# Patient Record
Sex: Female | Born: 1985 | Race: Black or African American | Hispanic: No | Marital: Single | Smoking: Former smoker
Health system: Southern US, Community
[De-identification: ages and names within clinical notes are randomized; demographics above are authoritative.]

## PROBLEM LIST (undated history)

## (undated) DIAGNOSIS — B977 Papillomavirus as the cause of diseases classified elsewhere: Secondary | ICD-10-CM

## (undated) DIAGNOSIS — J302 Other seasonal allergic rhinitis: Secondary | ICD-10-CM

## (undated) DIAGNOSIS — F419 Anxiety disorder, unspecified: Secondary | ICD-10-CM

## (undated) DIAGNOSIS — R87629 Unspecified abnormal cytological findings in specimens from vagina: Secondary | ICD-10-CM

## (undated) HISTORY — DX: Other seasonal allergic rhinitis: J30.2

## (undated) HISTORY — DX: Papillomavirus as the cause of diseases classified elsewhere: B97.7

## (undated) HISTORY — DX: Anxiety disorder, unspecified: F41.9

## (undated) HISTORY — DX: Unspecified abnormal cytological findings in specimens from vagina: R87.629

---

## 2012-06-21 HISTORY — PX: WISDOM TOOTH EXTRACTION: SHX21

## 2017-01-13 ENCOUNTER — Encounter: Payer: Self-pay | Admitting: Obstetrics and Gynecology

## 2017-01-13 ENCOUNTER — Ambulatory Visit (INDEPENDENT_AMBULATORY_CARE_PROVIDER_SITE_OTHER): Payer: PRIVATE HEALTH INSURANCE | Admitting: Obstetrics and Gynecology

## 2017-01-13 VITALS — BP 121/78 | HR 78 | Ht 62.5 in | Wt 210.1 lb

## 2017-01-13 DIAGNOSIS — Z113 Encounter for screening for infections with a predominantly sexual mode of transmission: Secondary | ICD-10-CM | POA: Diagnosis not present

## 2017-01-13 DIAGNOSIS — Z01419 Encounter for gynecological examination (general) (routine) without abnormal findings: Secondary | ICD-10-CM

## 2017-01-13 MED ORDER — VITAFOL ULTRA 29-0.6-0.4-200 MG PO CAPS: 1.0000 | ORAL_CAPSULE | Freq: Every day | ORAL | 12 refills | Status: DC

## 2017-01-13 MED ORDER — METRONIDAZOLE 0.75 % VA GEL: 1.0000 | Freq: Every day | VAGINAL | 6 refills | Status: DC

## 2017-01-13 NOTE — Progress Notes (Signed)
Subjective:     Christine Wong is a 31 y.o. female G0P0 with BMI 37 who is here for a comprehensive physical exam. The patient reports no problems. She is sexually active using nothing for contraception. She reports a monthly period of 5-6 days. She denies any pelvic pain or abnormal discharge. She is not planning a pregnancy but would welcome one if it occurred. She desires full STD screen. She desires a prescription for Metrogel in the event of recurrent BV  History reviewed. No pertinent past medical history. Past Surgical History:  Procedure Laterality Date  . WISDOM TOOTH EXTRACTION  2014   Family History  Problem Relation Age of Onset  . Hypertension Mother   . Breast cancer Mother   . Diabetes Father   . Kidney failure Father   . Heart disease Father   . Hypertension Father   . Pancreatic cancer Maternal Grandmother   . Hypertension Maternal Grandmother   . Breast cancer Paternal Grandmother   . Ovarian cancer Paternal Grandmother   . Colon cancer Maternal Uncle      Social History   Social History  . Marital status: Single    Spouse name: N/A  . Number of children: N/A  . Years of education: N/A   Occupational History  . Not on file.   Social History Main Topics  . Smoking status: Former Smoker    Types: Cigarettes    Quit date: 2009  . Smokeless tobacco: Never Used  . Alcohol use Yes     Comment: socially  . Drug use: Yes    Types: Marijuana     Comment: socially  . Sexual activity: Yes    Partners: Female    Birth control/ protection: None   Other Topics Concern  . Not on file   Social History Narrative  . No narrative on file   Health Maintenance  Topic Date Due  . HIV Screening  10/08/1998  . TETANUS/TDAP  10/08/2002  . PAP SMEAR  10/07/2004  . INFLUENZA VACCINE  01/19/2017       Review of Systems Pertinent items are noted in HPI.   Objective:  Blood pressure 121/78, pulse 78, height 5' 2.5" (1.588 m), weight 210 lb 1.6 oz (95.3 kg),  last menstrual period 12/28/2016.     GENERAL: Well-developed, well-nourished female in no acute distress.  HEENT: Normocephalic, atraumatic. Sclerae anicteric.  NECK: Supple. Normal thyroid.  LUNGS: Clear to auscultation bilaterally.  HEART: Regular rate and rhythm. BREASTS: Symmetric in size. No palpable masses or lymphadenopathy, skin changes, or nipple drainage. ABDOMEN: Soft, nontender, nondistended. PELVIC: Normal external female genitalia. Vagina is pink and rugated.  Normal discharge. Normal appearing cervix. Uterus is normal in size. No adnexal mass or tenderness. EXTREMITIES: No cyanosis, clubbing, or edema, 2+ distal pulses.    Assessment:    Healthy female exam.      Plan:    pap smear collected. STD screen collected Patient will be contacted with any abnormal results Patient advised to perform monthly self breast exam Weight loss management also discussed Advised to start prenatal vitamins See After Visit Summary for Counseling Recommendations

## 2017-01-14 LAB — HEPATITIS B SURFACE ANTIGEN: HEP B S AG: NEGATIVE

## 2017-01-14 LAB — CERVICOVAGINAL ANCILLARY ONLY
BACTERIAL VAGINITIS: POSITIVE — AB
CANDIDA VAGINITIS: NEGATIVE
CHLAMYDIA, DNA PROBE: NEGATIVE
Neisseria Gonorrhea: NEGATIVE
TRICH (WINDOWPATH): NEGATIVE

## 2017-01-14 LAB — RPR: RPR Ser Ql: NONREACTIVE

## 2017-01-14 LAB — HEMOGLOBIN A1C
Est. average glucose Bld gHb Est-mCnc: 103 mg/dL
Hgb A1c MFr Bld: 5.2 % (ref 4.8–5.6)

## 2017-01-14 LAB — HIV ANTIBODY (ROUTINE TESTING W REFLEX): HIV Screen 4th Generation wRfx: NONREACTIVE

## 2017-01-14 LAB — HEPATITIS C ANTIBODY: Hep C Virus Ab: <0.1 s/co ratio (ref 0.0–0.9)

## 2017-01-18 LAB — CYTOLOGY - PAP
DIAGNOSIS: NEGATIVE
HPV: DETECTED — AB

## 2017-01-19 ENCOUNTER — Telehealth: Payer: Self-pay | Admitting: Obstetrics and Gynecology

## 2017-01-19 NOTE — Telephone Encounter (Signed)
Just returning patients call.  She had left message that she was notified she had an Rx to pick up and did not know what this was for.  In reviewing her chart I see her labs came back showing BV.  Notified patient of result and to pick up Rx.

## 2017-03-29 ENCOUNTER — Telehealth: Payer: Self-pay | Admitting: General Practice

## 2017-03-29 NOTE — Telephone Encounter (Signed)
Left voicemail for pt to return call.

## 2017-03-29 NOTE — Telephone Encounter (Signed)
Patient called and left message requesting test results.

## 2018-01-13 ENCOUNTER — Encounter

## 2019-08-20 DIAGNOSIS — I82409 Acute embolism and thrombosis of unspecified deep veins of unspecified lower extremity: Secondary | ICD-10-CM

## 2019-08-20 HISTORY — DX: Acute embolism and thrombosis of unspecified deep veins of unspecified lower extremity: I82.409

## 2020-06-21 NOTE — L&D Delivery Note (Signed)
Delivery Note    Requested by Dr. Langston Masker to attend this C-section at 39 1/7 weeks due to fetal heart rate indication .   Born to a 35 year old G3P0020 mother with pregnancy complicated by  concern for gestational hypertension and Preeclampsia..  Rupture of membranes occurred 3 hours prior to delivery with clear   fluid.  Delayed cord clamping performed x 1 minute.  Infant vigorous with good spontaneous cry.  Routine NRP followed including warming, drying and stimulation.  Apgars 9 at 1 minute, 9 at 5 minutes.  Physical exam within normal limits.  Left in OR for skin-to-skin contact with mother, in care of nursing staff.  Care transferred to Pediatrician.  Merlon Alcorta, NNP-BC

## 2021-02-17 ENCOUNTER — Telehealth: Payer: Self-pay

## 2021-02-17 ENCOUNTER — Other Ambulatory Visit: Payer: Self-pay | Admitting: Obstetrics and Gynecology

## 2021-02-17 DIAGNOSIS — D6851 Activated protein C resistance: Secondary | ICD-10-CM

## 2021-02-17 DIAGNOSIS — Z3A28 28 weeks gestation of pregnancy: Secondary | ICD-10-CM

## 2021-02-17 DIAGNOSIS — O99119 Other diseases of the blood and blood-forming organs and certain disorders involving the immune mechanism complicating pregnancy, unspecified trimester: Secondary | ICD-10-CM

## 2021-02-17 DIAGNOSIS — Z363 Encounter for antenatal screening for malformations: Secondary | ICD-10-CM

## 2021-02-17 DIAGNOSIS — Z86718 Personal history of other venous thrombosis and embolism: Secondary | ICD-10-CM

## 2021-02-17 NOTE — Telephone Encounter (Signed)
Called and left message for patient to call our office regarding appointment in [facility]

## 2021-02-19 ENCOUNTER — Telehealth: Payer: Self-pay | Admitting: Hematology and Oncology

## 2021-02-19 NOTE — Telephone Encounter (Signed)
Scheduled appt per 8/24 referral. Pt is aware of appt date and time.  

## 2021-03-02 ENCOUNTER — Other Ambulatory Visit: Payer: Self-pay

## 2021-03-02 ENCOUNTER — Inpatient Hospital Stay: Payer: Managed Care, Other (non HMO)

## 2021-03-02 ENCOUNTER — Inpatient Hospital Stay: Payer: Managed Care, Other (non HMO) | Attending: Hematology and Oncology | Admitting: Hematology and Oncology

## 2021-03-02 ENCOUNTER — Encounter: Payer: Self-pay | Admitting: Hematology and Oncology

## 2021-03-02 VITALS — BP 122/75 | HR 92 | Temp 97.9°F | Resp 17 | Ht 62.5 in | Wt 218.5 lb

## 2021-03-02 DIAGNOSIS — D6851 Activated protein C resistance: Secondary | ICD-10-CM | POA: Diagnosis present

## 2021-03-02 DIAGNOSIS — I82402 Acute embolism and thrombosis of unspecified deep veins of left lower extremity: Secondary | ICD-10-CM | POA: Diagnosis not present

## 2021-03-02 DIAGNOSIS — Z880 Allergy status to penicillin: Secondary | ICD-10-CM | POA: Insufficient documentation

## 2021-03-02 DIAGNOSIS — Z803 Family history of malignant neoplasm of breast: Secondary | ICD-10-CM | POA: Diagnosis not present

## 2021-03-02 DIAGNOSIS — Z8249 Family history of ischemic heart disease and other diseases of the circulatory system: Secondary | ICD-10-CM | POA: Insufficient documentation

## 2021-03-02 DIAGNOSIS — Z86711 Personal history of pulmonary embolism: Secondary | ICD-10-CM | POA: Diagnosis not present

## 2021-03-02 DIAGNOSIS — I8290 Acute embolism and thrombosis of unspecified vein: Secondary | ICD-10-CM

## 2021-03-02 DIAGNOSIS — Z3A00 Weeks of gestation of pregnancy not specified: Secondary | ICD-10-CM | POA: Diagnosis not present

## 2021-03-02 DIAGNOSIS — O99891 Other specified diseases and conditions complicating pregnancy: Secondary | ICD-10-CM | POA: Diagnosis present

## 2021-03-02 DIAGNOSIS — Z7901 Long term (current) use of anticoagulants: Secondary | ICD-10-CM | POA: Diagnosis not present

## 2021-03-02 DIAGNOSIS — Z833 Family history of diabetes mellitus: Secondary | ICD-10-CM | POA: Diagnosis not present

## 2021-03-02 DIAGNOSIS — Z86718 Personal history of other venous thrombosis and embolism: Secondary | ICD-10-CM | POA: Diagnosis not present

## 2021-03-02 DIAGNOSIS — Z841 Family history of disorders of kidney and ureter: Secondary | ICD-10-CM | POA: Insufficient documentation

## 2021-03-02 DIAGNOSIS — Z8041 Family history of malignant neoplasm of ovary: Secondary | ICD-10-CM | POA: Insufficient documentation

## 2021-03-02 DIAGNOSIS — Z800 Family history of malignant neoplasm of digestive organs: Secondary | ICD-10-CM | POA: Diagnosis not present

## 2021-03-02 DIAGNOSIS — Z87891 Personal history of nicotine dependence: Secondary | ICD-10-CM | POA: Insufficient documentation

## 2021-03-02 LAB — CBC WITH DIFFERENTIAL (CANCER CENTER ONLY)
Abs Immature Granulocytes: 0.33 10*3/uL — ABNORMAL HIGH (ref 0.00–0.07)
Basophils Absolute: 0.1 10*3/uL (ref 0.0–0.1)
Basophils Relative: 1 %
Eosinophils Absolute: 0.2 10*3/uL (ref 0.0–0.5)
Eosinophils Relative: 2 %
HCT: 30.4 % — ABNORMAL LOW (ref 36.0–46.0)
Hemoglobin: 10.5 g/dL — ABNORMAL LOW (ref 12.0–15.0)
Immature Granulocytes: 3 %
Lymphocytes Relative: 9 %
Lymphs Abs: 1.2 10*3/uL (ref 0.7–4.0)
MCH: 29.4 pg (ref 26.0–34.0)
MCHC: 34.5 g/dL (ref 30.0–36.0)
MCV: 85.2 fL (ref 80.0–100.0)
Monocytes Absolute: 1.3 10*3/uL — ABNORMAL HIGH (ref 0.1–1.0)
Monocytes Relative: 10 %
Neutro Abs: 9.6 10*3/uL — ABNORMAL HIGH (ref 1.7–7.7)
Neutrophils Relative %: 75 %
Platelet Count: 447 10*3/uL — ABNORMAL HIGH (ref 150–400)
RBC: 3.57 MIL/uL — ABNORMAL LOW (ref 3.87–5.11)
RDW: 13.1 % (ref 11.5–15.5)
WBC Count: 12.7 10*3/uL — ABNORMAL HIGH (ref 4.0–10.5)
nRBC: 0 % (ref 0.0–0.2)

## 2021-03-02 LAB — CMP (CANCER CENTER ONLY)
ALT: 31 U/L (ref 0–44)
AST: 21 U/L (ref 15–41)
Albumin: 2.8 g/dL — ABNORMAL LOW (ref 3.5–5.0)
Alkaline Phosphatase: 170 U/L — ABNORMAL HIGH (ref 38–126)
Anion gap: 8 (ref 5–15)
BUN: 5 mg/dL — ABNORMAL LOW (ref 6–20)
CO2: 23 mmol/L (ref 22–32)
Calcium: 10.1 mg/dL (ref 8.9–10.3)
Chloride: 104 mmol/L (ref 98–111)
Creatinine: 0.63 mg/dL (ref 0.44–1.00)
GFR, Estimated: >60 mL/min (ref >60)
Glucose, Bld: 75 mg/dL (ref 70–99)
Potassium: 4.6 mmol/L (ref 3.5–5.1)
Sodium: 135 mmol/L (ref 135–145)
Total Bilirubin: 0.3 mg/dL (ref 0.3–1.2)
Total Protein: 6.9 g/dL (ref 6.5–8.1)

## 2021-03-02 NOTE — Progress Notes (Signed)
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 Started on Xarelto therapy. Recommended indefinite anticoagulation. Diagnosed with heterozygous FVL 03/11/2021: establish care with Dr. 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 female with medical history significant for seasonal allergies, heterozygous factor V Leiden, prior DVT and PE on indefinite anticoagulation who presents for evaluation of thromboprophylaxis in pregnancy.  On review of the previous records Christine Wong was originally diagnosed with a VTE in March 2021.  It was considered unprovoked left lower extremity DVT and pulmonary embolism.  For this she was started on Xarelto therapy and recommended for continuation indefinitely.  She continues therapy until she found out she was pregnant at which time she was transitioned over to Lovenox 40 mg twice daily.  Subsequently this was transitioned over to Lovenox 150 mg daily.  She underwent all of his care in [place] but subsequently moved down to [place] for better family support.  She presents today to establish care with a new hematological provider.  On exam today Christine Wong notes that she is a fairly healthy person.  She notes that during the pandemic she was working in [place] and had to commute 1 to 1.5 hours in traffic and spend a lot of time in her car.  She notes that she was not doing much exercise and was eating poorly.  Initially her presenting symptom was left leg swelling and pain.  She endorses Factor V Leiden  heterozygous status in herself and likely other members of her family based on her history of VTE.  She reports that her paternal grandfather's mother was of [place] descent.  She reports that her father had blood clots and had to be on Coumadin therapy.  There is also blood clots in her paternal grandfather and 2 cousins.  Her father also has type 2 diabetes.  Her mother side of the family has history remarkable for cancers.  Her mother had breast cancer, maternal grandmother had pancreatic cancer, maternal uncle had lung cancer, and other maternal uncle had colon cancer.  Additionally her paternal grandmother had breast and ovarian cancer.  On further discussion she notes that she was a smoker from ages approximately [age] and smoked about 1 pack/day.  For about 2-year period of time she also smoked marijuana but discontinued this when she developed a blood clot.  She notes that she did drink occasional alcohol but has had none since she became pregnant.  She does not vape or E-cigarettes.  She currently otherwise denies any fevers, chills, sweats, nausea vomiting or diarrhea.  The symptoms her prior clot has subsequently resolved.  Full 10 point ROS is listed below.  MEDICAL HISTORY:  Past Medical History:  Diagnosis Date   DVT (deep venous thrombosis) (HCC) 08/2019   Seasonal allergies     SURGICAL HISTORY: Past Surgical History:  Procedure Laterality Date   WISDOM TOOTH EXTRACTION  2014    SOCIAL HISTORY: Social History   Socioeconomic History   Marital status: Single    Spouse name: Not on file   Number of children: Not on file  Years of education: Not on file   Highest education level: Not on file  Occupational History   Not on file  Tobacco Use   Smoking status: Former    Types: Cigarettes    Quit date: 2009    Years since quitting: 13.7   Smokeless tobacco: Never  Vaping Use   Vaping Use: Never used  Substance and Sexual Activity   Alcohol use: Yes    Comment: socially    Drug use: Yes    Types: Marijuana    Comment: socially   Sexual activity: Yes    Partners: Female    Birth control/protection: None  Other Topics Concern   Not on file  Social History Narrative   Not on file   Social Determinants of Health   Financial Resource Strain: Not on file  Food Insecurity: Not on file  Transportation Needs: Not on file  Physical Activity: Not on file  Stress: Not on file  Social Connections: Not on file  Intimate Partner Violence: Not on file    FAMILY HISTORY: Family History  Problem Relation Age of Onset   Hypertension Mother    Breast cancer Mother    Diabetes Father    Kidney failure Father    Heart disease Father    Hypertension Father    Pancreatic cancer Maternal Grandmother    Hypertension Maternal Grandmother    Breast cancer Paternal Grandmother    Ovarian cancer Paternal Grandmother    Colon cancer Maternal Uncle     ALLERGIES:  is allergic to penicillins.  MEDICATIONS:  Current Outpatient Medications  Medication Sig Dispense Refill   enoxaparin (LOVENOX) 150 MG/ML injection Inject 150 mg into the skin daily.     cetirizine (ZYRTEC) 10 MG tablet Take by mouth.     fluticasone (FLONASE) 50 MCG/ACT nasal spray Place into the nose.     Prenat-Fe Poly-Methfol-FA-DHA (VITAFOL ULTRA) 29-0.6-0.4-200 MG CAPS Take 1 tablet by mouth daily. 30 capsule 12   No current facility-administered medications for this visit.    REVIEW OF SYSTEMS:   Constitutional: ( - ) fevers, ( - )  chills , ( - ) night sweats Eyes: ( - ) blurriness of vision, ( - ) double vision, ( - ) watery eyes Ears, nose, mouth, throat, and face: ( - ) mucositis, ( - ) sore throat Respiratory: ( - ) cough, ( - ) dyspnea, ( - ) wheezes Cardiovascular: ( - ) palpitation, ( - ) chest discomfort, ( - ) lower extremity swelling Gastrointestinal:  ( - ) nausea, ( - ) heartburn, ( - ) change in bowel habits Skin: ( - ) abnormal skin rashes Lymphatics: ( - ) new  lymphadenopathy, ( - ) easy bruising Neurological: ( - ) numbness, ( - ) tingling, ( - ) new weaknesses Behavioral/Psych: ( - ) mood change, ( - ) new changes  All other systems were reviewed with the patient and are negative.  PHYSICAL EXAMINATION: ECOG PERFORMANCE STATUS: 0 - Asymptomatic  Vitals:   03/02/21 1421  BP: 122/75  Pulse: 92  Resp: 17  Temp: 97.9 F (36.6 C)  SpO2: 99%   Filed Weights   03/02/21 1421  Weight: 218 lb 8 oz (99.1 kg)    GENERAL: well appearing middle-aged African-American female in NAD  SKIN: skin color, texture, turgor are normal, no rashes or significant lesions EYES: conjunctiva are pink and non-injected, sclera clear LUNGS: clear to auscultation and percussion with normal breathing effort HEART: regular rate & rhythm  and no murmurs and no lower extremity edema ABDOMEN: Gravid uterus Musculoskeletal: no cyanosis of digits and no clubbing  PSYCH: alert & oriented x 3, fluent speech NEURO: no focal motor/sensory deficits  LABORATORY DATA:  I have reviewed the data as listed CBC Latest Ref Rng & Units 03/02/2021  WBC 4.0 - 10.5 K/uL 12.7(H)  Hemoglobin 12.0 - 15.0 g/dL 10.5(L)  Hematocrit 36.0 - 46.0 % 30.4(L)  Platelets 150 - 400 K/uL 447(H)    CMP Latest Ref Rng & Units 03/02/2021  Glucose 70 - 99 mg/dL 75  BUN 6 - 20 mg/dL 5(L)  Creatinine 6.38 - 1.00 mg/dL 7.56  Sodium 433 - 295 mmol/L 135  Potassium 3.5 - 5.1 mmol/L 4.6  Chloride 98 - 111 mmol/L 104  CO2 22 - 32 mmol/L 23  Calcium 8.9 - 10.3 mg/dL 18.8  Total Protein 6.5 - 8.1 g/dL 6.9  Total Bilirubin 0.3 - 1.2 mg/dL 0.3  Alkaline Phos 38 - 126 U/L 170(H)  AST 15 - 41 U/L 21  ALT 0 - 44 U/L 31     RADIOGRAPHIC STUDIES: No results found.  ASSESSMENT & PLAN Christine Wong 35 y.o. female with medical history significant for seasonal allergies, heterozygous factor V Leiden, prior DVT and PE on indefinite anticoagulation who presents for evaluation of thromboprophylaxis in  pregnancy.  After review of the labs, review of the records, and discussion with the patient the patients findings are most consistent with an unprovoked VTE in the setting of heterozygous factor V Leiden.  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 This is preferable if patient desires daily dosing. Can continue this dosing regimen --after 6 weeks postpartum can transition back to Xarelto 20mg  PO daily. This should be safe with breast feeding --patient will require indefinite anticoagulation, though we can discuss decreasing Xarelto to maintenance dosing at a later time.  --plan to see patient back in 2 months to assess how she is doing with blood thinner prior to delivery.   Orders Placed This Encounter  Procedures   CBC with Differential (Cancer Center Only)    Standing Status:   Future    Number of Occurrences:   1    Standing Expiration Date:   03/02/2022   CMP (Cancer Center only)    Standing Status:   Future    Number of Occurrences:   1    Standing Expiration Date:   03/02/2022   Iron and TIBC    Standing Status:   Future    Number of Occurrences:   1    Standing Expiration Date:   03/02/2022   Ferritin    Standing Status:   Future    Number of Occurrences:   1    Standing Expiration Date:   03/02/2022    All questions were answered. The patient knows to call the clinic with any problems, questions or concerns.  A total of more than 60 minutes were spent on this encounter with face-to-face time and non-face-to-face time, including preparing to see the patient, ordering tests and/or medications, counseling the patient and coordination of care as outlined above.   05/02/2022, MD Department of Hematology/Oncology [facility] Phone: [phone] Pager: [phone] Email: [phone]  03/02/2021 5:30 PM

## 2021-03-03 ENCOUNTER — Encounter: Payer: Self-pay | Admitting: *Deleted

## 2021-03-03 LAB — IRON AND TIBC
Iron: 39 ug/dL (ref 28–170)
Saturation Ratios: 6 % — ABNORMAL LOW (ref 10.4–31.8)
TIBC: 617 ug/dL — ABNORMAL HIGH (ref 250–450)
UIBC: 578 ug/dL

## 2021-03-03 LAB — FERRITIN: Ferritin: 6 ng/mL — ABNORMAL LOW (ref 11–307)

## 2021-03-06 ENCOUNTER — Ambulatory Visit (HOSPITAL_BASED_OUTPATIENT_CLINIC_OR_DEPARTMENT_OTHER): Payer: Managed Care, Other (non HMO) | Admitting: Obstetrics

## 2021-03-06 ENCOUNTER — Other Ambulatory Visit: Payer: Self-pay

## 2021-03-06 ENCOUNTER — Ambulatory Visit: Payer: Managed Care, Other (non HMO) | Attending: Obstetrics and Gynecology

## 2021-03-06 ENCOUNTER — Ambulatory Visit: Payer: Managed Care, Other (non HMO) | Admitting: *Deleted

## 2021-03-06 ENCOUNTER — Other Ambulatory Visit: Payer: Self-pay | Admitting: *Deleted

## 2021-03-06 VITALS — BP 123/88 | HR 113

## 2021-03-06 DIAGNOSIS — O99342 Other mental disorders complicating pregnancy, second trimester: Secondary | ICD-10-CM | POA: Diagnosis not present

## 2021-03-06 DIAGNOSIS — O0992 Supervision of high risk pregnancy, unspecified, second trimester: Secondary | ICD-10-CM | POA: Diagnosis not present

## 2021-03-06 DIAGNOSIS — O2692 Pregnancy related conditions, unspecified, second trimester: Secondary | ICD-10-CM | POA: Diagnosis not present

## 2021-03-06 DIAGNOSIS — D6851 Activated protein C resistance: Secondary | ICD-10-CM

## 2021-03-06 DIAGNOSIS — O09523 Supervision of elderly multigravida, third trimester: Secondary | ICD-10-CM | POA: Diagnosis not present

## 2021-03-06 DIAGNOSIS — O99213 Obesity complicating pregnancy, third trimester: Secondary | ICD-10-CM

## 2021-03-06 DIAGNOSIS — D6859 Other primary thrombophilia: Secondary | ICD-10-CM | POA: Insufficient documentation

## 2021-03-06 DIAGNOSIS — Z3A28 28 weeks gestation of pregnancy: Secondary | ICD-10-CM | POA: Diagnosis not present

## 2021-03-06 DIAGNOSIS — F419 Anxiety disorder, unspecified: Secondary | ICD-10-CM | POA: Diagnosis not present

## 2021-03-06 DIAGNOSIS — O09522 Supervision of elderly multigravida, second trimester: Secondary | ICD-10-CM | POA: Insufficient documentation

## 2021-03-06 DIAGNOSIS — O99212 Obesity complicating pregnancy, second trimester: Secondary | ICD-10-CM | POA: Diagnosis not present

## 2021-03-06 DIAGNOSIS — Z6837 Body mass index (BMI) 37.0-37.9, adult: Secondary | ICD-10-CM

## 2021-03-06 DIAGNOSIS — O2293 Venous complication in pregnancy, unspecified, third trimester: Secondary | ICD-10-CM | POA: Diagnosis not present

## 2021-03-06 DIAGNOSIS — Z363 Encounter for antenatal screening for malformations: Secondary | ICD-10-CM

## 2021-03-06 DIAGNOSIS — Z86711 Personal history of pulmonary embolism: Secondary | ICD-10-CM | POA: Diagnosis not present

## 2021-03-06 DIAGNOSIS — O99112 Other diseases of the blood and blood-forming organs and certain disorders involving the immune mechanism complicating pregnancy, second trimester: Secondary | ICD-10-CM | POA: Diagnosis not present

## 2021-03-06 DIAGNOSIS — O99113 Other diseases of the blood and blood-forming organs and certain disorders involving the immune mechanism complicating pregnancy, third trimester: Secondary | ICD-10-CM | POA: Diagnosis not present

## 2021-03-06 DIAGNOSIS — Z86718 Personal history of other venous thrombosis and embolism: Secondary | ICD-10-CM

## 2021-03-06 DIAGNOSIS — E669 Obesity, unspecified: Secondary | ICD-10-CM | POA: Diagnosis not present

## 2021-03-06 DIAGNOSIS — Z3689 Encounter for other specified antenatal screening: Secondary | ICD-10-CM

## 2021-03-06 IMAGING — US US MFM OB DETAIL+14 WK
1 series · 15 of 28 positions shown · non-contrast
Comparison: none

[Series 1: us mfm ob detail+14 wk · 15 of 131 slices shown]
[im 1/131]
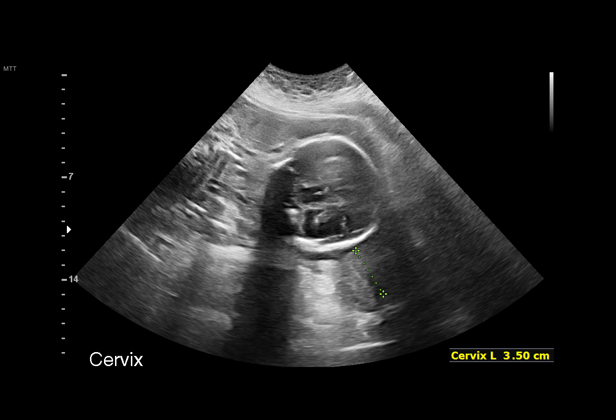
[im 10/131]
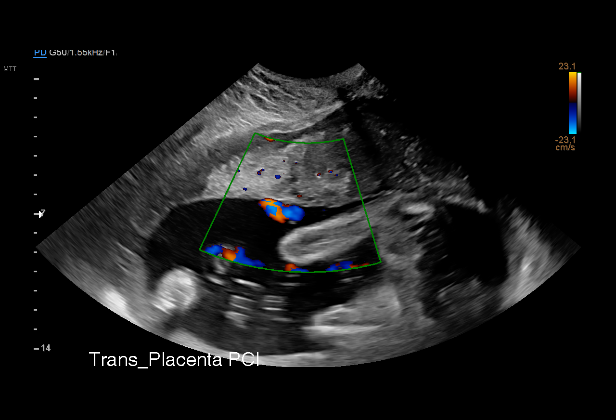
[im 20/131]
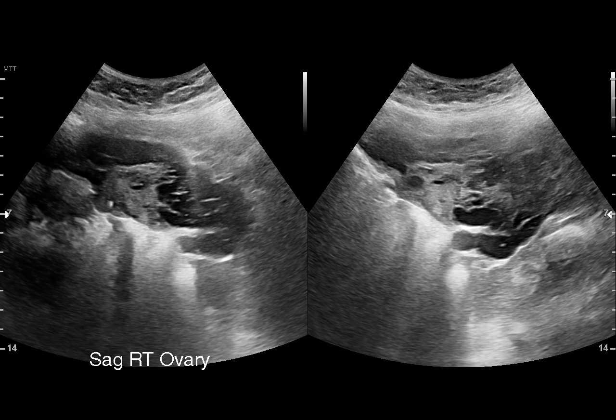
[im 29/131]
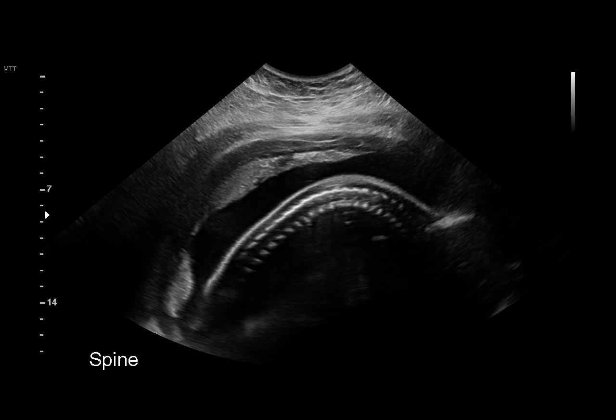
[im 39/131]
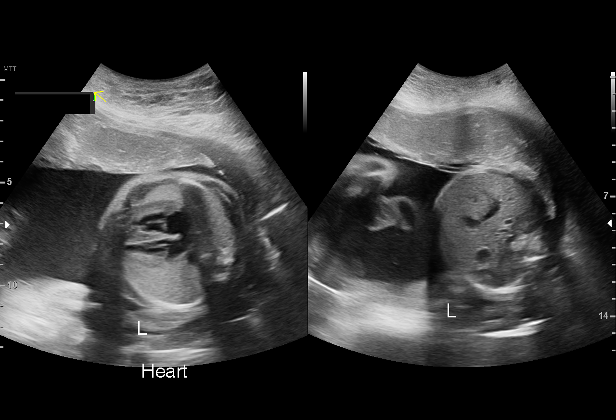
[im 49/131]
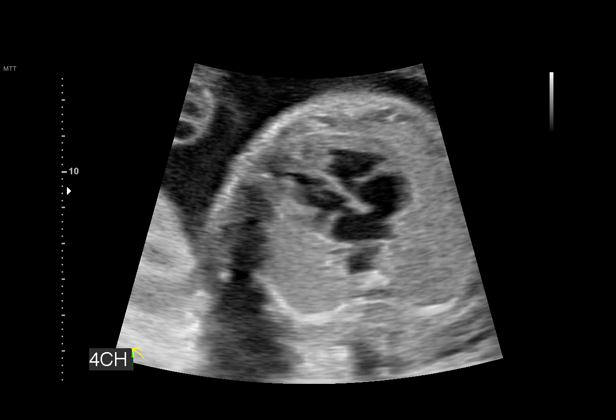
[im 58/131]
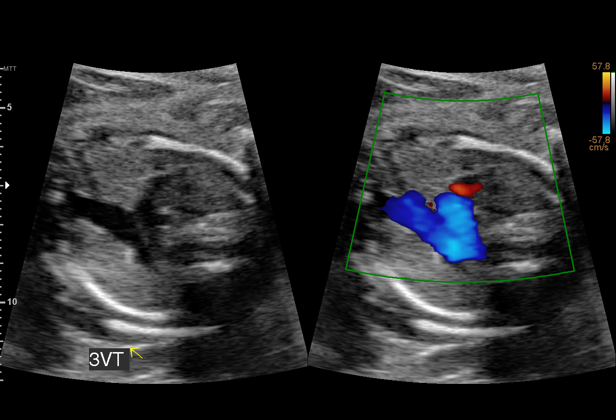
[im 68/131]
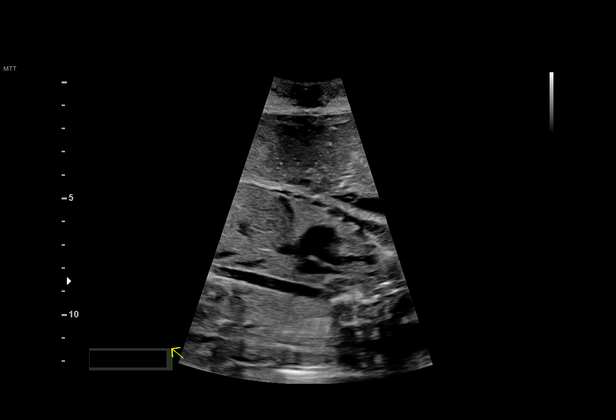
[im 73/131]
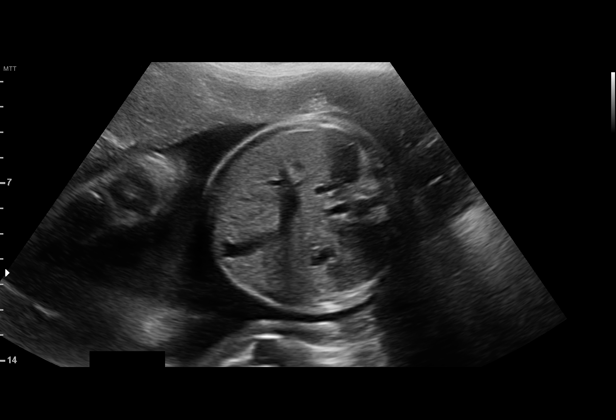
[im 82/131]
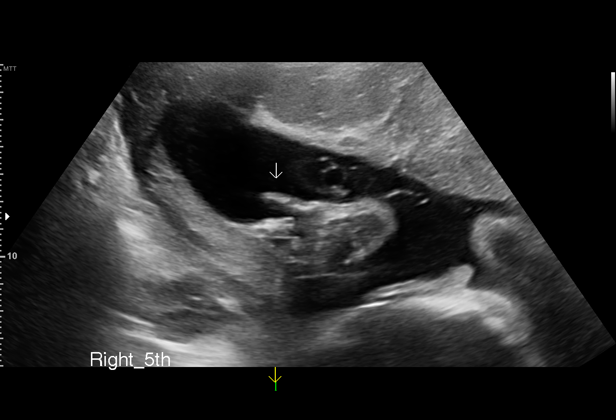
[im 92/131]
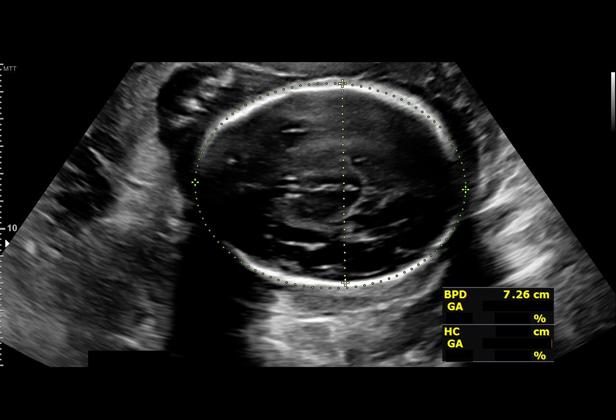
[im 102/131]
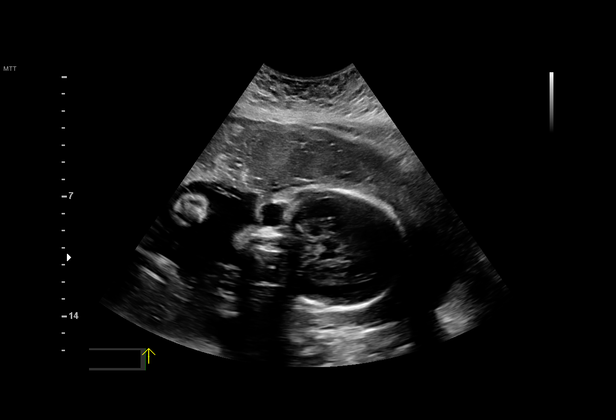
[im 111/131]
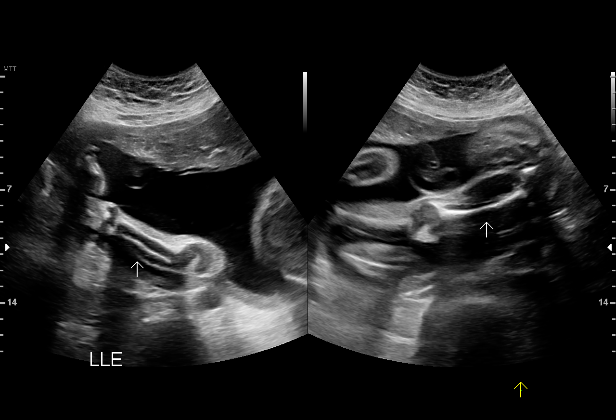
[im 121/131]
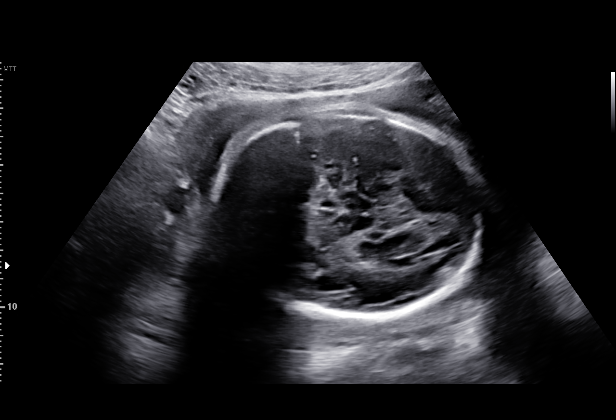
[im 131/131]
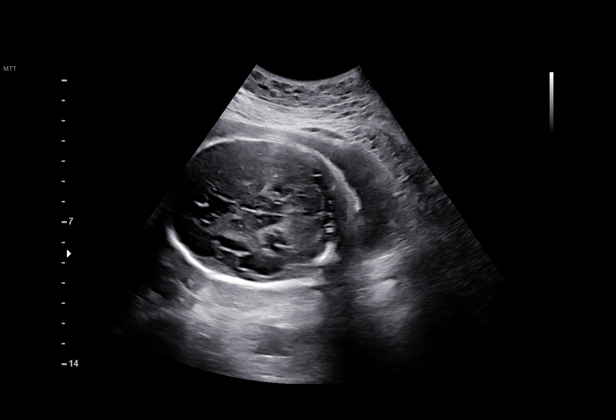

[15 of 28 positions shown; findings below may reference images not displayed]

[REDACTED]

Indications

 Medical complication of pregnancy (Factor V     [EI]
 [place], Hx DVT/PE)
 Advanced maternal age multigravida 35+,         [EI]
 third trimester
 Obesity complicating pregnancy, third           [EI]
 trimester (BMI 37)
 28 weeks gestation of pregnancy
 Encounter for antenatal screening for           [EI]
 malformations
 Anxiety during pregnancy, third trimester       O99.343, [EI]
Fetal Evaluation

 Num Of Fetuses:          1
 Fetal Heart Rate(bpm):   136
 Cardiac Activity:        Observed
 Presentation:            Cephalic
 Placenta:                Anterior
 P. Cord Insertion:       Visualized, central

 Amniotic Fluid
 AFI FV:      Within normal limits

 AFI Sum(cm)     %Tile       Largest Pocket(cm)
 22.9            95
 RUQ(cm)       RLQ(cm)       LUQ(cm)        LLQ(cm)

Biometry

 BPD:      72.2  mm     G. Age:  29w 0d         70  %    CI:        70.91   %    70 - 86
                                                         FL/HC:       18.4  %    18.8 -
 HC:      273.2  mm     G. Age:  29w 6d         76  %    HC/AC:       1.08       1.05 -
 AC:      252.7  mm     G. Age:  29w 3d         84  %    FL/BPD:      69.8  %    71 - 87
 FL:       50.4  mm     G. Age:  27w 0d         13  %    FL/AC:       19.9  %    20 - 24
 HUM:      47.8  mm     G. Age:  28w 0d         48  %
 CER:      34.5  mm     G. Age:  29w 1d         76  %

 LV:        3.5  mm
 CM:        8.9  mm

 Est. FW:    [EI]   gm   2 lb 13 oz      64  %
OB History

 Blood Type:   O+
 Gravidity:    3         Term:   0        Prem:   0        SAB:   2
 TOP:          0       Ectopic:  0        Living: 0
Gestational Age

 LMP:           28w 0d        Date:  [DATE]                 EDD:   [DATE]
 U/S Today:     28w 6d                                        EDD:   [DATE]
 Best:          28w 0d     Det. By:  LMP  ([DATE])          EDD:   [DATE]
Anatomy

 Cranium:               Appears normal         LVOT:                   Appears normal
 Cavum:                 Appears normal         Aortic Arch:            Appears normal
 Ventricles:            Appears normal         Ductal Arch:            Appears normal
 Choroid Plexus:        Not well visualized    Diaphragm:              Appears normal
 Cerebellum:            Appears normal         Stomach:                Appears normal, left
                                                                       sided
 Posterior Fossa:       Appears normal         Abdomen:                Appears normal
 Nuchal Fold:           Not applicable (>20    Abdominal Wall:         Appears nml (cord
                        wks GA)                                        insert, abd wall)
 Face:                  Appears normal         Cord Vessels:           Appears normal (3
                        (orbits and profile)                           vessel cord)
 Lips:                  Appears normal         Kidneys:                Appear normal
 Palate:                Not well visualized    Bladder:                Appears normal
 Thoracic:              Appears normal         Spine:                  Appears normal
 Heart:                 Appears normal; EIF    Upper Extremities:      Appears normal
 RVOT:                  Appears normal         Lower Extremities:      Appears normal

 Other:  Fetus appears to be female. Nasal bone, Lenses, VC, 3VV and 3VTV
         visualized. Heels/feet and open hands/5th digits visualized.
         Technically difficult due to fetal position.
Cervix Uterus Adnexa

 Cervix
 Length:            3.5  cm.
 Normal appearance by transabdominal scan.
 Uterus
 No abnormality visualized.

 Right Ovary
 Within normal limits.

 Left Ovary
 Within normal limits.

 Cul De Sac
 No free fluid seen.

 Adnexa
 No abnormality visualized.
Comments

 [place] was seen for a detailed fetal anatomy scan
 due to advanced maternal age.  She has screened positive
 as a heterozygous carrier for the factor V Leiden gene
 mutation.  She reports a significant history of a left leg DVT
 and pulmonary embolus in [DATE].  Her hematologist
 has recommended that she remain on lifelong
 anticoagulation due to her thrombophilia and history of
 thrombosis.  She was treated with Xarelto outside of
 pregnancy.  She is currently treated with a therapeutic dose
 of Lovenox 150 mg once a day.  She also reports a
 significant family history of thromboembolic events in both her
 father and grandfather.
 Ms. TIGER also reports a history of heart palpitations.  She
 has not had any evaluations for the palpitations.
 The patient recently [place] to [place] [facility] [place].  She reports that she had a fetal anatomy
 scan in [place] where an echogenic focus was noted.
 She denies any other significant past medical history and
 denies any problems in her current pregnancy.
 She had a cell free DNA test earlier in her pregnancy which
 indicated a low risk for trisomy 21, 18, and 13. A female fetus
 is predicted.
 She was informed that the fetal growth and amniotic fluid
 level were appropriate for her gestational age.
 On today's exam, an intracardiac echogenic focus was noted
 in the left ventricle of the fetal heart.  The small association
 between an echogenic focus and Down syndrome was
 discussed. Due to the echogenic focus noted today, the
 patient was offered and declined an amniocentesis today for
 definitive diagnosis of fetal aneuploidy.  She reports that she
 is comfortable with her negative cell free DNA test.
 The views of the fetal anatomy were limited today due to her
 advanced gestational age.
 The patient was informed that anomalies may be missed due
 to technical limitations. If the fetus is in a suboptimal position
 or maternal habitus is increased, visualization of the fetus in
 the maternal uterus may be impaired.
 The following were discussed today:
 Heterozygosity for factor V Leiden with a prior
 thromboembolic event in pregnancy
 Factor V Leiden is the most common thrombophilia.  People
 with this gene mutation have an increased risk of thrombosis.

 As pregnancy is a hypercoagulable state, with her history of a
 prior thromboembolic event, she should continue therapeutic
 Lovenox (150 mg daily) for the duration of pregnancy as
 recommended by her hematologist.
 In regards to the management of her anticoagulation and
 delivery, she should contine Lovenox up until the day before
 her scheduled delivery date.    As she is treated with a
 theraputic dose of anticoagulation, she may be eligible to
 receive regional anesthesia if it has been 24 hours or more

 An anesthesia consult should be obtained when she is
 admitted for delivery.
 As her hematologist has recommended that she remain on
 lifelong anticoagulation, Lovenox should be resumed 6 hours
 after a vaginal delivery and 12 hours following a cesarean
 delivery.
 The small association of thrombophilias with fetal growth
 restriction was discussed.  Due to this indication, we will
 continue to follow her with monthly growth ultrasounds.
 Due to her thrombophilia, her age, and obesity, weekly fetal
 testing should be started at 32 weeks.
 History of heart palpitations
 As she has never received a work-up for her heart
 palpitations, she should have a cardiology evaluation to
 determine if the heart palpitations are of any clinical
 significance.
 A follow-up growth scan was scheduled in our office in 4
 weeks.
 The patient stated that all of her questions have been
 answered to her complete satisfaction.
 A total of 30 minutes was spent counseling and coordinating
 the care for this patient.  Greater than 50% of the time was
 spent in direct face-to-face contact.
Recommendations

 Continue Lovenox 150 mg daily for the duration of her
 pregnancy
 Monthly growth ultrasounds
 Weekly fetal testing to be started at 32 weeks
 Anesthesia consult
 Cardiology consult
 Hold Lovenox for 24 hours prior to scheduled delivery
 Resume therapeutic anticoagulation 6 hours following a
 vaginal delivery and 12 hours following cesarean delivery

## 2021-03-07 NOTE — Progress Notes (Signed)
MFM Note  Christine Wong was seen for a detailed fetal anatomy scan due to advanced maternal age.  She has screened positive as a heterozygous carrier for the factor V Leiden gene mutation.  She reports a significant history of a left leg DVT and pulmonary embolus in March 2021.  Her hematologist has recommended that she remain on lifelong anticoagulation due to her thrombophilia and history of thrombosis.  She was treated with Xarelto outside of pregnancy.  She is currently treated with a therapeutic dose of Lovenox 150 mg once a day.  She also reports a significant family history of thromboembolic events in both her father and grandfather.  Christine Wong also reports a history of heart palpitations.  She has not had any evaluations for the palpitations.  The patient recently moved to [facility] from [place], [place].  She reports that she had a fetal anatomy scan in [place] where an echogenic focus was noted.  She denies any other significant past medical history and denies any problems in her current pregnancy.    She had a cell free DNA test earlier in her pregnancy which indicated a low risk for trisomy 63, 79, and 13. A female fetus is predicted.   She was informed that the fetal growth and amniotic fluid level were appropriate for her gestational age.   On today's exam, an intracardiac echogenic focus was noted in the left ventricle of the fetal heart.  The small association between an echogenic focus and Down syndrome was discussed. Due to the echogenic focus noted today, the patient was offered and declined an amniocentesis today for definitive diagnosis of fetal aneuploidy.  She reports that she is comfortable with her negative cell free DNA test.  The views of the fetal anatomy were limited today due to her advanced gestational age.  The patient was informed that anomalies may be missed due to technical limitations. If the fetus is in a suboptimal position or maternal habitus is  increased, visualization of the fetus in the maternal uterus may be impaired.  The following were discussed today:  Heterozygosity for factor V Leiden with a prior thromboembolic event in pregnancy  Factor V Leiden is the most common thrombophilia.  People with this gene mutation have an increased risk of thrombosis.    As pregnancy is a hypercoagulable state, with her history of a prior thromboembolic event, she should continue therapeutic Lovenox (150 mg daily) for the duration of pregnancy as recommended by her hematologist.  In regards to the management of her anticoagulation and delivery, she should contine Lovenox up until the day before her scheduled delivery date.      As she is treated with a theraputic dose of anticoagulation, she may be eligible to receive regional anesthesia if it has been 24 hours or more since she received the anticoagulation medication.   An anesthesia consult should be obtained when she is admitted for delivery.  As her hematologist has recommended that she remain on lifelong anticoagulation, Lovenox should be resumed 6 hours after a vaginal delivery and 12 hours following a cesarean delivery.   The small association of thrombophilias with fetal growth restriction was discussed.  Due to this indication, we will continue to follow her with monthly growth ultrasounds.  Due to her thrombophilia, her age, and obesity, weekly fetal testing should be started at 32 weeks.  History of heart palpitations  As she has never received a work-up for her heart palpitations, she should have a cardiology  evaluation to determine if the heart palpitations are of any clinical significance.  A follow-up growth scan was scheduled in our office in 4 weeks.    The patient stated that all of her questions have been answered to her complete satisfaction.  A total of 30 minutes was spent counseling and coordinating the care for this patient.  Greater than 50% of the time was spent  in direct face-to-face contact.  Recommendations:  Continue Lovenox 150 mg daily for the duration of her pregnancy Monthly growth ultrasounds Weekly fetal testing to be started at 32 weeks Anesthesia consult Cardiology consult Hold Lovenox for 24 hours prior to scheduled delivery Resume therapeutic anticoagulation 6 hours following a vaginal delivery and 12 hours following cesarean delivery

## 2021-03-23 ENCOUNTER — Telehealth: Payer: Self-pay | Admitting: *Deleted

## 2021-03-23 ENCOUNTER — Other Ambulatory Visit: Payer: Self-pay | Admitting: Hematology and Oncology

## 2021-03-23 DIAGNOSIS — D500 Iron deficiency anemia secondary to blood loss (chronic): Secondary | ICD-10-CM

## 2021-03-23 NOTE — Telephone Encounter (Signed)
Call made to [facility] ([facility] ) to have patient scheduled for IV iron,. She is in her 3rd trimester of pregnancy. She is nto receive 2 infusions, a week apart.

## 2021-04-06 ENCOUNTER — Ambulatory Visit: Payer: Managed Care, Other (non HMO) | Attending: Obstetrics

## 2021-04-06 ENCOUNTER — Other Ambulatory Visit: Payer: Self-pay | Admitting: Obstetrics

## 2021-04-06 ENCOUNTER — Ambulatory Visit: Payer: Managed Care, Other (non HMO) | Admitting: *Deleted

## 2021-04-06 ENCOUNTER — Other Ambulatory Visit: Payer: Self-pay

## 2021-04-06 VITALS — BP 97/60 | HR 101

## 2021-04-06 DIAGNOSIS — O283 Abnormal ultrasonic finding on antenatal screening of mother: Secondary | ICD-10-CM | POA: Insufficient documentation

## 2021-04-06 DIAGNOSIS — Z3A32 32 weeks gestation of pregnancy: Secondary | ICD-10-CM | POA: Diagnosis not present

## 2021-04-06 DIAGNOSIS — O09523 Supervision of elderly multigravida, third trimester: Secondary | ICD-10-CM

## 2021-04-06 DIAGNOSIS — O99119 Other diseases of the blood and blood-forming organs and certain disorders involving the immune mechanism complicating pregnancy, unspecified trimester: Secondary | ICD-10-CM | POA: Diagnosis present

## 2021-04-06 DIAGNOSIS — D6851 Activated protein C resistance: Secondary | ICD-10-CM | POA: Insufficient documentation

## 2021-04-06 DIAGNOSIS — O99213 Obesity complicating pregnancy, third trimester: Secondary | ICD-10-CM

## 2021-04-06 DIAGNOSIS — O99113 Other diseases of the blood and blood-forming organs and certain disorders involving the immune mechanism complicating pregnancy, third trimester: Secondary | ICD-10-CM | POA: Diagnosis not present

## 2021-04-06 DIAGNOSIS — Z86718 Personal history of other venous thrombosis and embolism: Secondary | ICD-10-CM

## 2021-04-06 DIAGNOSIS — Z6837 Body mass index (BMI) 37.0-37.9, adult: Secondary | ICD-10-CM | POA: Insufficient documentation

## 2021-04-06 IMAGING — US US MFM OB FOLLOW-UP
1 series · 13 of 28 positions shown · non-contrast
Comparison: none

[Series 1: us mfm ob follow-up · 33 acquisitions, 13 frames shown]
[im 2/33]
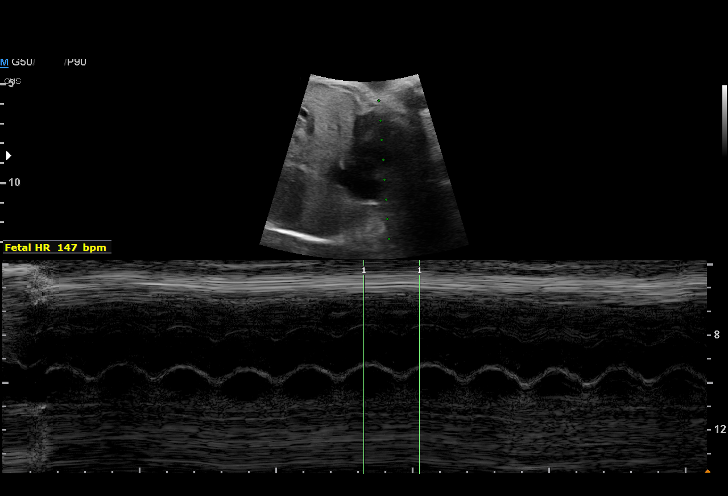
[im 4/33]
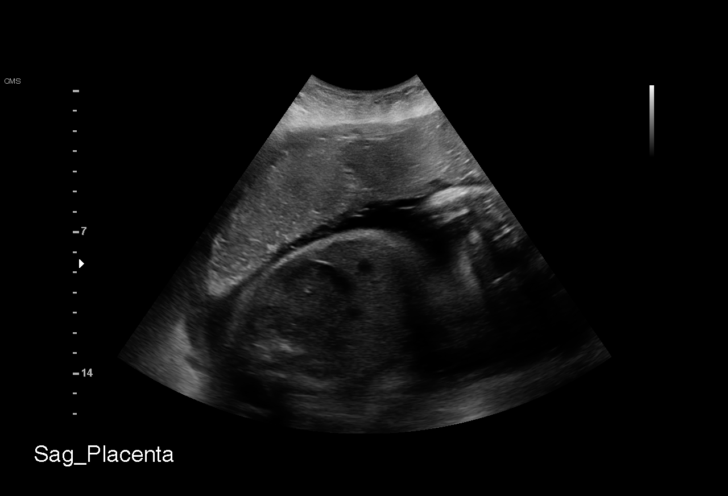
[im 6/33]
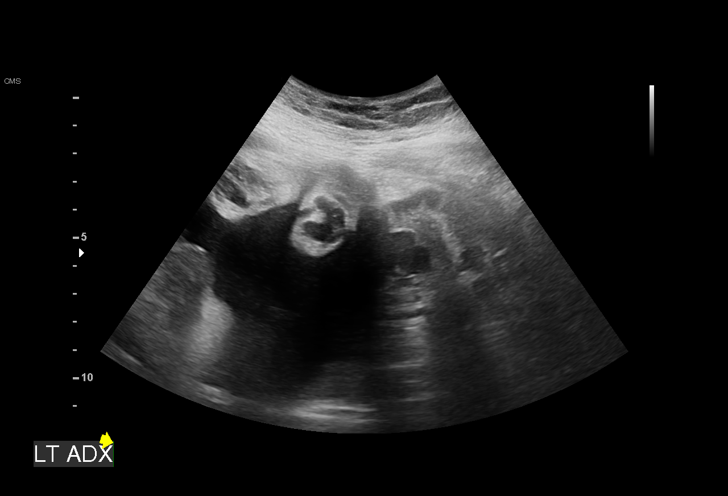
[im 9/33]
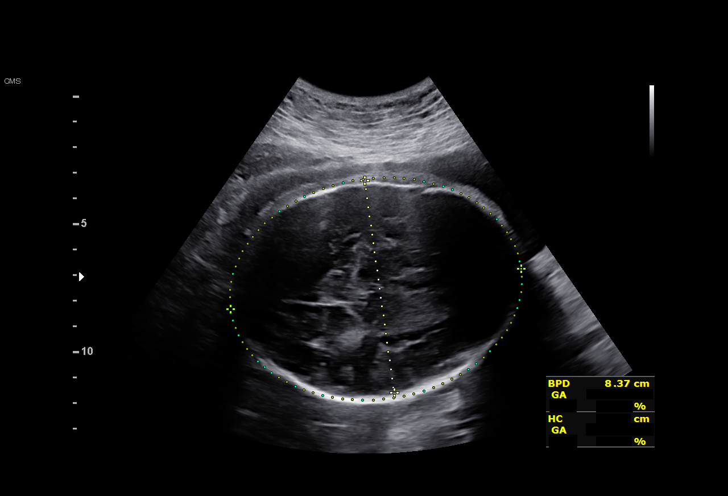
[im 11/33]
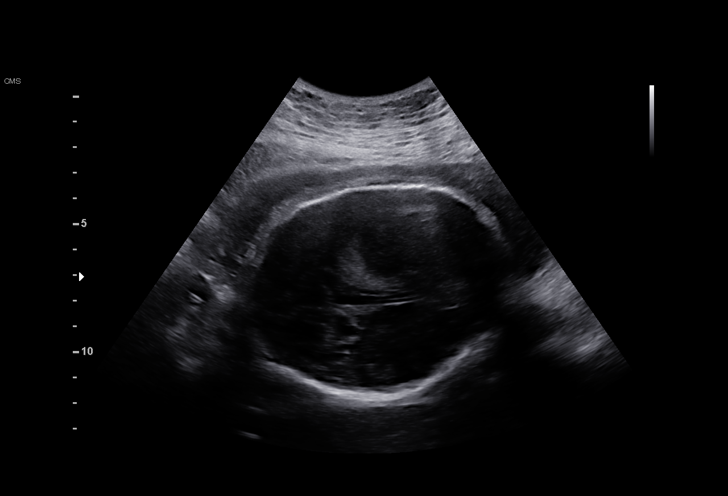
[im 14/33]
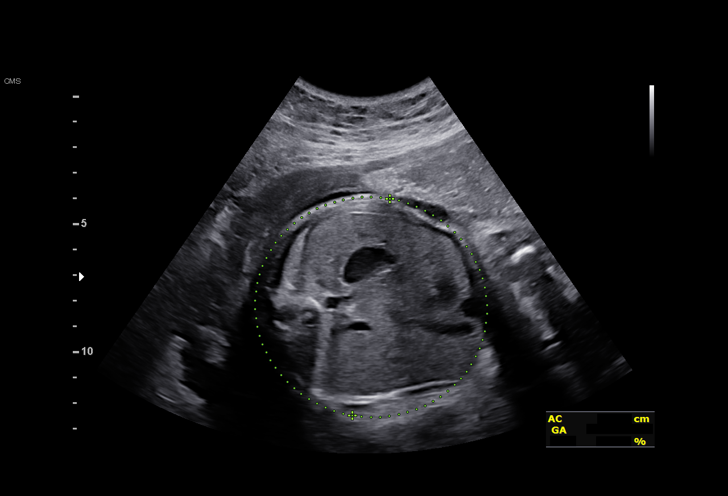
[im 17/33]
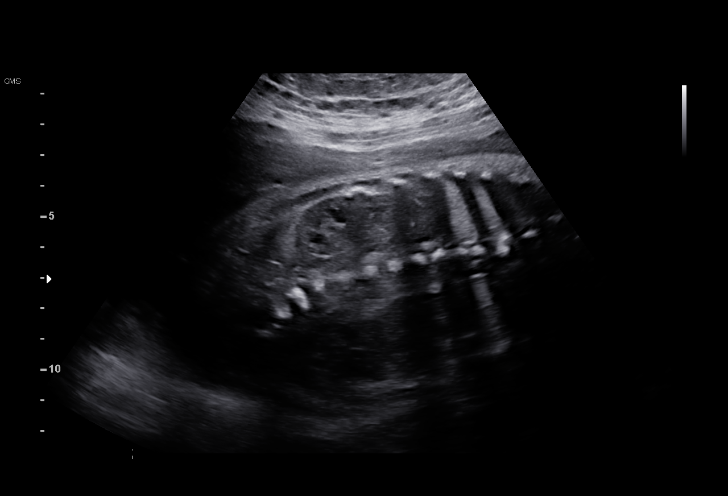
[im 19/33]
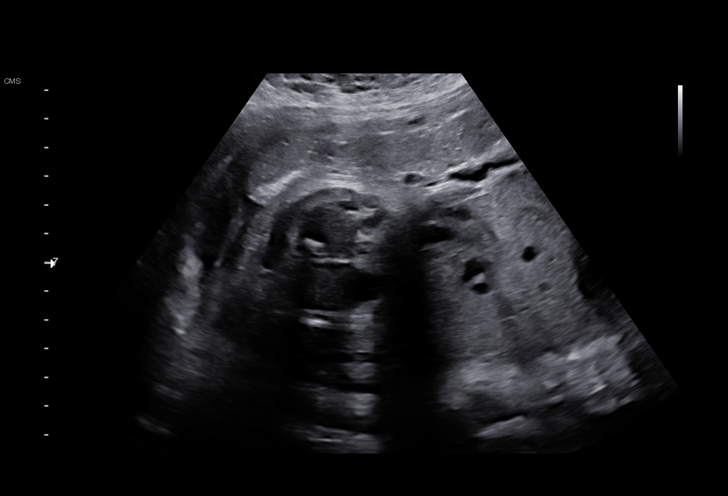
[im 22/33]
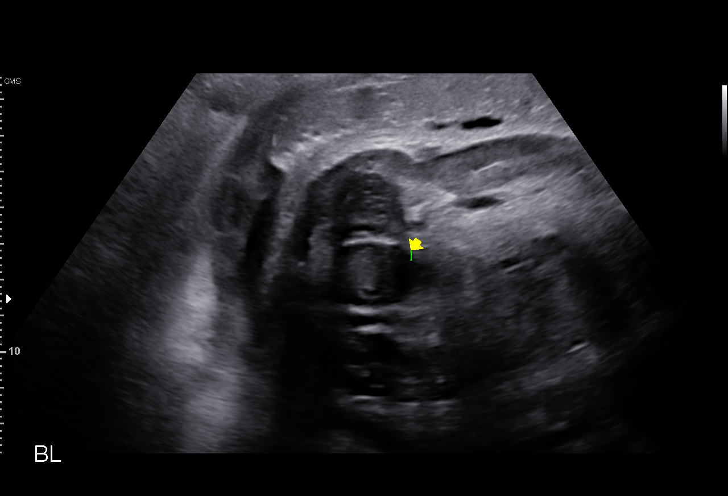
[im 24/33]
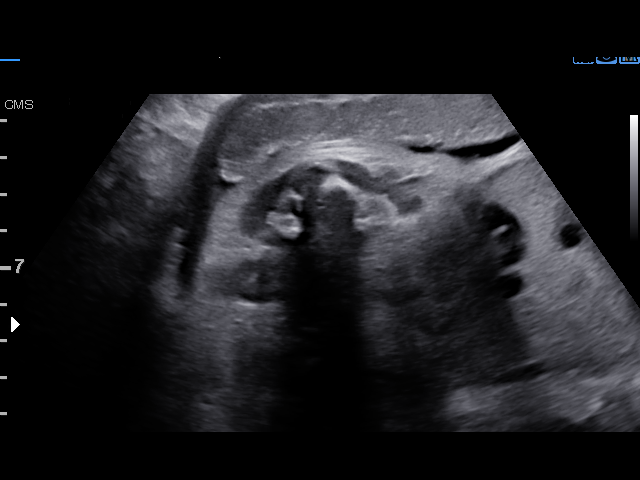
[im 27/33]
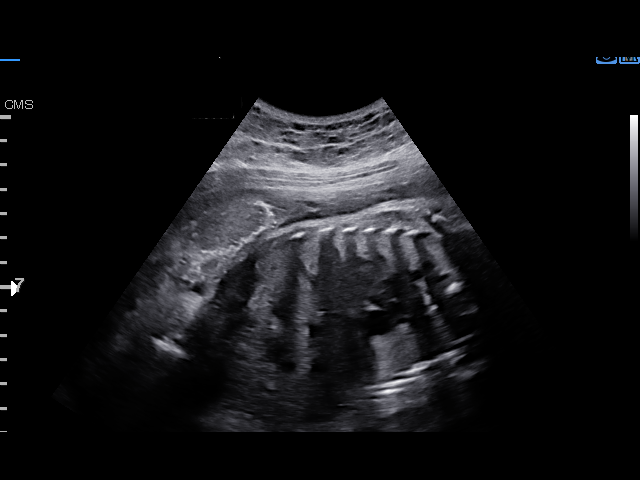
[im 29/33]
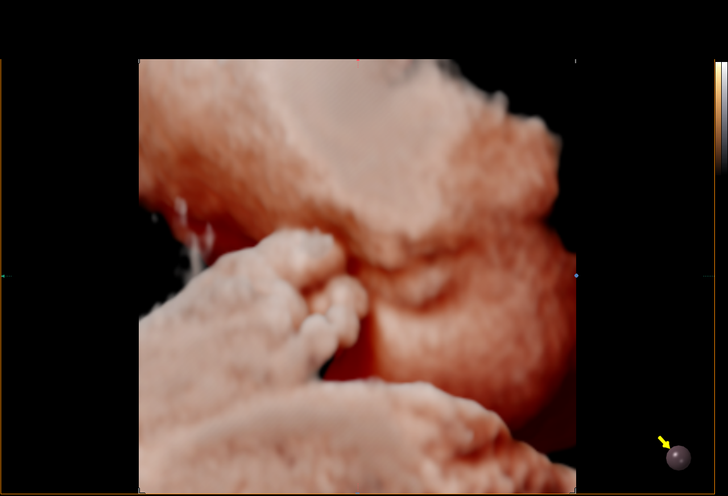
[im 31/33]
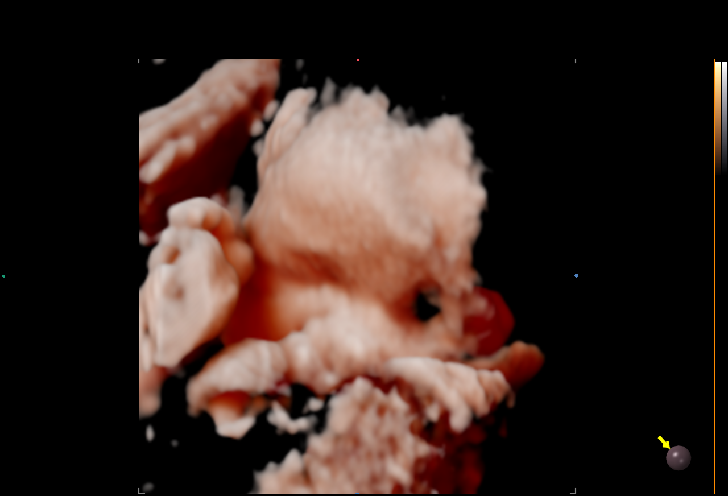

[13 of 28 positions shown; findings below may reference images not displayed]

[REDACTED]

Indications

 Medical complication of pregnancy (Factor V    [9G]
 DIVER, Hx DVT/PE)
 Advanced maternal age multigravida 35+,        [9G]
 third trimester
 Obesity complicating pregnancy, third          [9G]
 trimester (BMI 37)
 32 weeks gestation of pregnancy
 Anxiety during pregnancy, third trimester      O99.343, [9G]
Fetal Evaluation

 Num Of Fetuses:         1
 Fetal Heart Rate(bpm):  147
 Cardiac Activity:       Observed
 Presentation:           Cephalic
 Placenta:               Anterior
 P. Cord Insertion:      Previously Visualized

 Amniotic Fluid
 AFI FV:      Within normal limits

 AFI Sum(cm)     %Tile       Largest Pocket(cm)
 22.63           87

 RUQ(cm)       RLQ(cm)       LUQ(cm)        LLQ(cm)

Biometry

 BPD:      83.9  mm     G. Age:  33w 5d         80  %    CI:        69.19   %    70 - 86
                                                         FL/HC:      20.2   %    19.1 -
 HC:      322.1  mm     G. Age:  36w 3d         96  %    HC/AC:      1.13        0.96 -
 AC:      285.8  mm     G. Age:  32w 4d         55  %    FL/BPD:     77.6   %    71 - 87
 FL:       65.1  mm     G. Age:  33w 4d         69  %    FL/AC:      22.8   %    20 - 24
 LV:          2  mm

 Est. FW:    [9G]  gm    4 lb 13 oz      70  %
OB History

 Blood Type:   O+
 Gravidity:    3         Term:   0        Prem:   0        SAB:   2
 TOP:          0       Ectopic:  0        Living: 0
Gestational Age

 LMP:           32w 3d        Date:  [DATE]                 EDD:   [DATE]
 U/S Today:     34w 1d                                        EDD:   [DATE]
 Best:          32w 3d     Det. By:  LMP  ([DATE])          EDD:   [DATE]
Anatomy

 Cranium:               Appears normal         Aortic Arch:            Previously seen
 Cavum:                 Previously seen        Ductal Arch:            Previously seen
 Ventricles:            Appears normal         Diaphragm:              Previously seen
 Choroid Plexus:        Appears normal         Stomach:                Appears normal, left
                                                                       sided
 Cerebellum:            Previously seen        Abdomen:                Appears normal
 Posterior Fossa:       Previously seen        Abdominal Wall:         Previously seen
 Nuchal Fold:           Not applicable (>20    Cord Vessels:           Previously seen
                        wks GA)
 Face:                  Orbits and profile     Kidneys:                Appear normal
                        previously seen
 Lips:                  Previously seen        Bladder:                Appears normal
 Thoracic:              Previously seen        Spine:                  Previously seen
 Heart:                 Appears normal; EIF    Upper Extremities:      Previously seen
 RVOT:                  Previously seen        Lower Extremities:      Previously seen
 LVOT:                  Previously seen

 Other:  Fetus appears to be female. Nasal bone, Lenses, VC, 3VV and 3VTV
         previously visualized. Heels/feet and open hands/5th digits previously
         visualized. Technically difficult due to fetal position.
Cervix Uterus Adnexa

 Cervix
 Not visualized (advanced GA >[9G])

 Right Ovary
 Not visualized.

 Left Ovary
 Visualized.
Comments

 This patient was seen for a follow up growth scan as she is
 heterozygous for the factor V Leiden gene mutation with a
 history of a prior thromboembolic events.  Her pregnancy has
 also been complicated by advanced maternal age and
 maternal obesity.  She denies any problems since her last
 exam.  She remains on Lovenox 150 mg daily.
 She was informed that the fetal growth and amniotic fluid
 level appears appropriate for her gestational age.
 A follow up growth scan was scheduled in 4 weeks.
 Due to her underlying medical conditions, she should
 probably have weekly NSTs performed in your office starting
 at 36 weeks and continued until delivery.

## 2021-05-03 ENCOUNTER — Other Ambulatory Visit: Payer: Self-pay | Admitting: Hematology and Oncology

## 2021-05-03 DIAGNOSIS — I8290 Acute embolism and thrombosis of unspecified vein: Secondary | ICD-10-CM

## 2021-05-04 ENCOUNTER — Telehealth: Payer: Self-pay

## 2021-05-04 ENCOUNTER — Ambulatory Visit (HOSPITAL_BASED_OUTPATIENT_CLINIC_OR_DEPARTMENT_OTHER)
Admission: RE | Admit: 2021-05-04 | Discharge: 2021-05-04 | Disposition: A | Discharge status: Home / Self Care | Payer: Medicaid Other | Source: Ambulatory Visit | Attending: Hematology and Oncology | Admitting: Hematology and Oncology

## 2021-05-04 ENCOUNTER — Inpatient Hospital Stay: Payer: Medicaid Other | Attending: Hematology and Oncology

## 2021-05-04 ENCOUNTER — Inpatient Hospital Stay (HOSPITAL_BASED_OUTPATIENT_CLINIC_OR_DEPARTMENT_OTHER): Payer: Medicaid Other | Admitting: Hematology and Oncology

## 2021-05-04 ENCOUNTER — Other Ambulatory Visit: Payer: Self-pay

## 2021-05-04 VITALS — BP 120/94 | HR 87 | Temp 97.7°F | Resp 17 | Wt 226.0 lb

## 2021-05-04 DIAGNOSIS — Z3A09 9 weeks gestation of pregnancy: Secondary | ICD-10-CM | POA: Insufficient documentation

## 2021-05-04 DIAGNOSIS — I8290 Acute embolism and thrombosis of unspecified vein: Secondary | ICD-10-CM

## 2021-05-04 DIAGNOSIS — Z79899 Other long term (current) drug therapy: Secondary | ICD-10-CM | POA: Insufficient documentation

## 2021-05-04 DIAGNOSIS — I82502 Chronic embolism and thrombosis of unspecified deep veins of left lower extremity: Secondary | ICD-10-CM

## 2021-05-04 DIAGNOSIS — Z8249 Family history of ischemic heart disease and other diseases of the circulatory system: Secondary | ICD-10-CM | POA: Diagnosis not present

## 2021-05-04 DIAGNOSIS — Z841 Family history of disorders of kidney and ureter: Secondary | ICD-10-CM | POA: Diagnosis not present

## 2021-05-04 DIAGNOSIS — Z803 Family history of malignant neoplasm of breast: Secondary | ICD-10-CM | POA: Insufficient documentation

## 2021-05-04 DIAGNOSIS — O99891 Other specified diseases and conditions complicating pregnancy: Secondary | ICD-10-CM | POA: Insufficient documentation

## 2021-05-04 DIAGNOSIS — Z800 Family history of malignant neoplasm of digestive organs: Secondary | ICD-10-CM | POA: Diagnosis not present

## 2021-05-04 DIAGNOSIS — M79606 Pain in leg, unspecified: Secondary | ICD-10-CM | POA: Insufficient documentation

## 2021-05-04 DIAGNOSIS — Z8041 Family history of malignant neoplasm of ovary: Secondary | ICD-10-CM | POA: Insufficient documentation

## 2021-05-04 DIAGNOSIS — Z833 Family history of diabetes mellitus: Secondary | ICD-10-CM | POA: Insufficient documentation

## 2021-05-04 DIAGNOSIS — D500 Iron deficiency anemia secondary to blood loss (chronic): Secondary | ICD-10-CM

## 2021-05-04 DIAGNOSIS — D6851 Activated protein C resistance: Secondary | ICD-10-CM | POA: Diagnosis present

## 2021-05-04 DIAGNOSIS — Z7901 Long term (current) use of anticoagulants: Secondary | ICD-10-CM | POA: Insufficient documentation

## 2021-05-04 DIAGNOSIS — M7989 Other specified soft tissue disorders: Secondary | ICD-10-CM | POA: Diagnosis not present

## 2021-05-04 DIAGNOSIS — Z87891 Personal history of nicotine dependence: Secondary | ICD-10-CM | POA: Insufficient documentation

## 2021-05-04 DIAGNOSIS — Z880 Allergy status to penicillin: Secondary | ICD-10-CM | POA: Insufficient documentation

## 2021-05-04 DIAGNOSIS — O99111 Other diseases of the blood and blood-forming organs and certain disorders involving the immune mechanism complicating pregnancy, first trimester: Secondary | ICD-10-CM | POA: Diagnosis not present

## 2021-05-04 DIAGNOSIS — O99113 Other diseases of the blood and blood-forming organs and certain disorders involving the immune mechanism complicating pregnancy, third trimester: Secondary | ICD-10-CM | POA: Insufficient documentation

## 2021-05-04 LAB — CMP (CANCER CENTER ONLY)
ALT: 9 U/L (ref 0–44)
AST: 13 U/L — ABNORMAL LOW (ref 15–41)
Albumin: 2.8 g/dL — ABNORMAL LOW (ref 3.5–5.0)
Alkaline Phosphatase: 213 U/L — ABNORMAL HIGH (ref 38–126)
Anion gap: 9 (ref 5–15)
BUN: 6 mg/dL (ref 6–20)
CO2: 21 mmol/L — ABNORMAL LOW (ref 22–32)
Calcium: 9.7 mg/dL (ref 8.9–10.3)
Chloride: 106 mmol/L (ref 98–111)
Creatinine: 0.67 mg/dL (ref 0.44–1.00)
GFR, Estimated: >60 mL/min (ref >60)
Glucose, Bld: 71 mg/dL (ref 70–99)
Potassium: 4.2 mmol/L (ref 3.5–5.1)
Sodium: 136 mmol/L (ref 135–145)
Total Bilirubin: 0.2 mg/dL — ABNORMAL LOW (ref 0.3–1.2)
Total Protein: 7 g/dL (ref 6.5–8.1)

## 2021-05-04 LAB — CBC WITH DIFFERENTIAL (CANCER CENTER ONLY)
Abs Immature Granulocytes: 0.11 10*3/uL — ABNORMAL HIGH (ref 0.00–0.07)
Basophils Absolute: 0.1 10*3/uL (ref 0.0–0.1)
Basophils Relative: 1 %
Eosinophils Absolute: 0.3 10*3/uL (ref 0.0–0.5)
Eosinophils Relative: 2 %
HCT: 36.6 % (ref 36.0–46.0)
Hemoglobin: 12.3 g/dL (ref 12.0–15.0)
Immature Granulocytes: 1 %
Lymphocytes Relative: 14 %
Lymphs Abs: 1.4 10*3/uL (ref 0.7–4.0)
MCH: 28.9 pg (ref 26.0–34.0)
MCHC: 33.6 g/dL (ref 30.0–36.0)
MCV: 86.1 fL (ref 80.0–100.0)
Monocytes Absolute: 1.2 10*3/uL — ABNORMAL HIGH (ref 0.1–1.0)
Monocytes Relative: 11 %
Neutro Abs: 7.4 10*3/uL (ref 1.7–7.7)
Neutrophils Relative %: 71 %
Platelet Count: 324 10*3/uL (ref 150–400)
RBC: 4.25 MIL/uL (ref 3.87–5.11)
RDW: 14.9 % (ref 11.5–15.5)
WBC Count: 10.3 10*3/uL (ref 4.0–10.5)
nRBC: 0 % (ref 0.0–0.2)

## 2021-05-04 NOTE — Telephone Encounter (Signed)
Pt advised of negative [place] of lower extremity.

## 2021-05-04 NOTE — Progress Notes (Addendum)
[place] [phone]   [phone]  PROGRESS NOTE  Patient Care Team: Medicine, Grant as PCP - General (Family Medicine)  Hematological/Oncological History # Heterozygous Factor V Leiden with Hx of VTE/PE # Pregnancy, Due Date 05/29/21 08/2019: patient diagnosed with LLE DVT and PE while in [place]. Started on Xarelto therapy. Recommended indefinite anticoagulation. Diagnosed with heterozygous FVL 03/11/2021: establish care with Christine Wong   Interval History:  Christine Wong 35 y.o. female with medical history significant for heterozygous factor V Leiden with a history of VTE/PE who presents for a follow up visit. The patient's last visit was on 03/11/2021 at which time she established care. In the interim since the last visit she is continued on daily Lovenox therapy.  On exam today Christine Wong reports she has been well in the interim since her last visit.  She has been taking the Lovenox therapy as prescribed once daily with a shot in the abdomen.  She notes she is not had any issues with bleeding or bruising while on blood thinner.  She is also had no blood in her bowel movements.  She reports about 2 days ago she developed "excruciating leg pain".  She notes that there was some swelling at the time but the symptoms have subsequently decreased in severity.  She notes also some swelling in her hands.  She otherwise denies any fevers, chills, sweats, nausea, vomiting or diarrhea.  Full 10 point ROS is listed below.  MEDICAL HISTORY:  Past Medical History:  Diagnosis Date   Anxiety    DVT (deep venous thrombosis) ([place]) 08/2019   HPV (human papilloma virus) infection    Seasonal allergies    Vaginal Pap smear, abnormal     SURGICAL HISTORY: Past Surgical History:  Procedure Laterality Date   WISDOM TOOTH EXTRACTION  2014    SOCIAL HISTORY: Social History   Socioeconomic History   Marital status: Single    Spouse name: Not  on file   Number of children: Not on file   Years of education: Not on file   Highest education level: Not on file  Occupational History   Not on file  Tobacco Use   Smoking status: Former    Types: Cigarettes    Quit date: 2009    Years since quitting: 13.8   Smokeless tobacco: Never  Vaping Use   Vaping Use: Never used  Substance and Sexual Activity   Alcohol use: Yes    Comment: socially   Drug use: Yes    Types: Marijuana    Comment: socially last use 9 months ago   Sexual activity: Yes    Partners: Female    Birth control/protection: None  Other Topics Concern   Not on file  Social History Narrative   Not on file   Social Determinants of Health   Financial Resource Strain: Not on file  Food Insecurity: Not on file  Transportation Needs: Not on file  Physical Activity: Not on file  Stress: Not on file  Social Connections: Not on file  Intimate Partner Violence: Not on file    FAMILY HISTORY: Family History  Problem Relation Age of Onset   Hypertension Mother    Breast cancer Mother    Diabetes Father    Kidney failure Father    Heart disease Father    Hypertension Father    Pancreatic cancer Maternal Grandmother    Hypertension Maternal Grandmother    Breast cancer Paternal Grandmother  Ovarian cancer Paternal Grandmother    Colon cancer Maternal Uncle     ALLERGIES:  is allergic to penicillins.  MEDICATIONS:  Current Outpatient Medications  Medication Sig Dispense Refill   enoxaparin (LOVENOX) 150 MG/ML injection Inject 150 mg into the skin daily.     ferrous sulfate 325 (65 FE) MG tablet Take 325 mg by mouth daily with breakfast.     Prenat-Fe Poly-Methfol-FA-DHA (VITAFOL ULTRA) 29-0.6-0.4-200 MG CAPS Take 1 tablet by mouth daily. 30 capsule 12   cetirizine (ZYRTEC) 10 MG tablet Take by mouth. (Patient not taking: Reported on 05/04/2021)     fluticasone (FLONASE) 50 MCG/ACT nasal spray Place into the nose.     No current facility-administered  medications for this visit.    REVIEW OF SYSTEMS:   Constitutional: ( - ) fevers, ( - )  chills , ( - ) night sweats Eyes: ( - ) blurriness of vision, ( - ) double vision, ( - ) watery eyes Ears, nose, mouth, throat, and face: ( - ) mucositis, ( - ) sore throat Respiratory: ( - ) cough, ( - ) dyspnea, ( - ) wheezes Cardiovascular: ( - ) palpitation, ( - ) chest discomfort, ( - ) lower extremity swelling Gastrointestinal:  ( - ) nausea, ( - ) heartburn, ( - ) change in bowel habits Skin: ( - ) abnormal skin rashes Lymphatics: ( - ) new lymphadenopathy, ( - ) easy bruising Neurological: ( - ) numbness, ( - ) tingling, ( - ) new weaknesses Behavioral/Psych: ( - ) mood change, ( - ) new changes  All other systems were reviewed with the patient and are negative.  PHYSICAL EXAMINATION: ECOG PERFORMANCE STATUS: 0 - Asymptomatic  Vitals:   05/04/21 1055  BP: (!) 120/94  Pulse: 87  Resp: 17  Temp: 97.7 F (36.5 C)  SpO2: 96%   Filed Weights   05/04/21 1055  Weight: 226 lb (102.5 kg)    GENERAL: Well appearing pregnant African-American female, alert, no distress and comfortable SKIN: skin color, texture, turgor are normal, no rashes or significant lesions EYES: conjunctiva are pink and non-injected, sclera clear LUNGS: clear to auscultation and percussion with normal breathing effort HEART: regular rate & rhythm and no murmurs and no lower extremity edema ABDOMEN: Gravid uterus Musculoskeletal: no cyanosis of digits and no clubbing  PSYCH: alert & oriented x 3, fluent speech NEURO: no focal motor/sensory deficits  LABORATORY DATA:  I have reviewed the data as listed CBC Latest Ref Rng & Units 05/04/2021 03/02/2021  WBC 4.0 - 10.5 K/uL 10.3 12.7(H)  Hemoglobin 12.0 - 15.0 g/dL 18.2 10.5(L)  Hematocrit 36.0 - 46.0 % 36.6 30.4(L)  Platelets 150 - 400 K/uL 324 447(H)    CMP Latest Ref Rng & Units 05/04/2021 03/02/2021  Glucose 70 - 99 mg/dL 71 75  BUN 6 - 20 mg/dL 6 5(L)   Creatinine 9.93 - 1.00 mg/dL 7.16 9.67  Sodium 893 - 145 mmol/L 136 135  Potassium 3.5 - 5.1 mmol/L 4.2 4.6  Chloride 98 - 111 mmol/L 106 104  CO2 22 - 32 mmol/L 21(L) 23  Calcium 8.9 - 10.3 mg/dL 9.7 81.0  Total Protein 6.5 - 8.1 g/dL 7.0 6.9  Total Bilirubin 0.3 - 1.2 mg/dL 1.7(P) 0.3  Alkaline Phos 38 - 126 U/L 213(H) 170(H)  AST 15 - 41 U/L 13(L) 21  ALT 0 - 44 U/L 9 31    RADIOGRAPHIC STUDIES: VAS [place] LOWER EXTREMITY VENOUS (DVT)  Result Date: 05/04/2021  Lower Venous DVT Study Patient Name:  Christine Wong  Date of Exam:   05/04/2021 Medical Rec #: [id number]       Accession #:    [id number] Date of Birth: Nov 30, 1985       Patient Gender: F Patient Age:   37 years Exam Location:  [facility] Procedure:      VAS [place] LOWER EXTREMITY VENOUS (DVT) Referring Phys: Jeanie Sewer --------------------------------------------------------------------------------  Indications: Pain, and Swelling.  Risk Factors: Factor V Leiden HX of PE & LLE DVT, currently pregnant. Anticoagulation: Lovenox. Comparison Study: No previous exams Performing Technologist: Jody Hill RVT, RDMS  Examination Guidelines: A complete evaluation includes B-mode imaging, spectral Doppler, color Doppler, and power Doppler as needed of all accessible portions of each vessel. Bilateral testing is considered an integral part of a complete examination. Limited examinations for reoccurring indications may be performed as noted. The reflux portion of the exam is performed with the patient in reverse Trendelenburg.  +-----+---------------+---------+-----------+----------+--------------+ RIGHTCompressibilityPhasicitySpontaneityPropertiesThrombus Aging +-----+---------------+---------+-----------+----------+--------------+ CFV  Full           Yes      Yes                                 +-----+---------------+---------+-----------+----------+--------------+    +---------+---------------+---------+-----------+----------+--------------+ LEFT     CompressibilityPhasicitySpontaneityPropertiesThrombus Aging +---------+---------------+---------+-----------+----------+--------------+ CFV      Full           Yes      Yes                                 +---------+---------------+---------+-----------+----------+--------------+ SFJ      Full                                                        +---------+---------------+---------+-----------+----------+--------------+ FV Prox  Full           Yes      Yes                                 +---------+---------------+---------+-----------+----------+--------------+ FV Mid   Full           Yes      Yes                                 +---------+---------------+---------+-----------+----------+--------------+ FV DistalFull           Yes      Yes                                 +---------+---------------+---------+-----------+----------+--------------+ PFV      Full                                                        +---------+---------------+---------+-----------+----------+--------------+ POP      Full           Yes  Yes                                 +---------+---------------+---------+-----------+----------+--------------+ PTV      Full                                                        +---------+---------------+---------+-----------+----------+--------------+ PERO     Full                                                        +---------+---------------+---------+-----------+----------+--------------+ Rouleaux flow noted in CFV.   Summary: RIGHT: - No evidence of common femoral vein obstruction.  LEFT: - There is no evidence of deep vein thrombosis in the lower extremity. - There is no evidence of superficial venous thrombosis.  - No cystic structure found in the popliteal fossa.  *See table(s) above for measurements and observations.  Electronically signed by Monica Martinez MD on 05/04/2021 at 12:51:54 PM.    Final    [place] MFM OB FOLLOW UP  Result Date: 04/06/2021 ----------------------------------------------------------------------  OBSTETRICS REPORT                       (Signed Final 04/06/2021 10:38 am) ---------------------------------------------------------------------- Patient Info  ID #:       [id number]                          D.O.B.:  Nov 05, 1985 (35 yrs)  Name:       Christine Wong                  Visit Date: 04/06/2021 09:44 am ---------------------------------------------------------------------- Performed By  Christine Wong:        Johnell Comings MD         Ref. Address:     [facility]                                                             [address]  [place]                                                             [place]  Performed By:     Lelan Pons RDMS       Location:         [facility]                                                             [place] [facility]  Referred By:      Carlyon Shadow MD ---------------------------------------------------------------------- Orders  #  Description                           Code        Ordered By  1  [place] MFM OB FOLLOW UP                   [id number]    Peterson Ao ----------------------------------------------------------------------  #  Order #                     Accession #                Episode #  1  [id number]                   [id number]                 [id number] ---------------------------------------------------------------------- Indications  Medical complication of pregnancy (Factor V    O26.90  Leiden, Hx  DVT/PE)  Advanced maternal age multigravida 48+,        O74.523  third trimester  Obesity complicating pregnancy, third          O99.213  trimester (BMI 37)  [redacted] weeks gestation of pregnancy                Z3A.32  Anxiety during pregnancy, third trimester      O99.343, F41.9 ---------------------------------------------------------------------- Fetal Evaluation  Num Of Fetuses:         1  Fetal Heart Rate(bpm):  147  Cardiac Activity:       Observed  Presentation:  Cephalic  Placenta:               Anterior  P. Cord Insertion:      Previously Visualized  Amniotic Fluid  AFI FV:      Within normal limits  AFI Sum(cm)     %Tile       Largest Pocket(cm)  22.63           87          6.99  RUQ(cm)       RLQ(cm)       LUQ(cm)        LLQ(cm)  5.52          4.79          5.33           6.99 ---------------------------------------------------------------------- Biometry  BPD:      83.9  mm     G. Age:  33w 5d         80  %    CI:        69.19   %    70 - 86                                                          FL/HC:      20.2   %    19.1 - 21.3  HC:      322.1  mm     G. Age:  36w 3d         96  %    HC/AC:      1.13        0.96 - 1.17  AC:      285.8  mm     G. Age:  32w 4d         55  %    FL/BPD:     77.6   %    71 - 87  FL:       65.1  mm     G. Age:  33w 4d         69  %    FL/AC:      22.8   %    20 - 24  LV:          2  mm  Est. FW:    2180  gm    4 lb 13 oz      70  % ---------------------------------------------------------------------- OB History  Blood Type:   O+  Gravidity:    3         Term:   0        Prem:   0        SAB:   2  TOP:          0       Ectopic:  0        Living: 0 ---------------------------------------------------------------------- Gestational Age  LMP:           32w 3d        Date:  08/22/20                 EDD:   05/29/21  U/S Today:     34w 1d  EDD:   05/17/21  Best:          Christine Wong 3d     Det. By:  LMP  (08/22/20)          EDD:   05/29/21  ---------------------------------------------------------------------- Anatomy  Cranium:               Appears normal         Aortic Arch:            Previously seen  Cavum:                 Previously seen        Ductal Arch:            Previously seen  Ventricles:            Appears normal         Diaphragm:              Previously seen  Choroid Plexus:        Appears normal         Stomach:                Appears normal, left                                                                        sided  Cerebellum:            Previously seen        Abdomen:                Appears normal  Posterior Fossa:       Previously seen        Abdominal Wall:         Previously seen  Nuchal Fold:           Not applicable ([id number]    Cord Vessels:           Previously seen                         wks GA)  Face:                  Orbits and profile     Kidneys:                Appear normal                         previously seen  Lips:                  Previously seen        Bladder:                Appears normal  Thoracic:              Previously seen        Spine:                  Previously seen  Heart:                 Appears normal; EIF    Upper Extremities:      Previously seen  RVOT:  Previously seen        Lower Extremities:      Previously seen  LVOT:                  Previously seen  Other:  Fetus appears to be female. Nasal bone, Lenses, VC, 3VV and 3VTV          previously visualized. Heels/feet and open hands/5th digits previously          visualized. Technically difficult due to fetal position. ---------------------------------------------------------------------- Cervix Uterus Adnexa  Cervix  Not visualized (advanced GA >24wks)  Right Ovary  Not visualized.  Left Ovary  Visualized. ---------------------------------------------------------------------- Comments  This patient was seen for a follow up growth scan as she is  heterozygous for the factor V Leiden gene mutation with a  history of a prior  thromboembolic events.  Her pregnancy has  also been complicated by advanced maternal age and  maternal obesity.  She denies any problems since her last  exam.  She remains on Lovenox 150 mg daily.  She was informed that the fetal growth and amniotic fluid  level appears appropriate for her gestational age.  A follow up growth scan was scheduled in 4 weeks.  Due to her underlying medical conditions, she should  probably have weekly NSTs performed in your office starting  at 36 weeks and continued until delivery. ----------------------------------------------------------------------                   Johnell Comings, MD Electronically Signed Final Report   04/06/2021 10:38 am ----------------------------------------------------------------------     ASSESSMENT & PLAN Christine Wong 35 y.o. female with medical history significant for heterozygous factor V Leiden with a history of VTE/PE who presents for a follow up visit.   After review of the labs, review of the records, and discussion with the patient the patients findings are most consistent with an unprovoked VTE in the setting of heterozygous factor V Leiden.  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 This is preferable if patient desires daily dosing. Can continue this dosing regimen --Patient reported that her OB provider was considering switching her over to a therapeutic dose subcutaneous heparin.  This would be a reasonable option if they would prefer this given how close she is to delivery. --after 6 weeks postpartum can transition back to Xarelto 20mg  PO daily. This should be safe with breast feeding --patient will require indefinite anticoagulation, though we can discuss decreasing Xarelto to maintenance dosing at a later  time.  --RTC in approximately 9 weeks (6 weeks post partum)   # Lower Extremity Pain -- Repeat ultrasound of the lower extremity performed, no evidence of acute DVT --Potentially secondary to muscle cramp given his transient nature. --Strict return precautions for any new or worsening symptoms lower extremity.   No orders of the defined types were placed in this encounter.   All questions were answered. The patient knows to call the clinic with any problems, questions or concerns.  A total of more than 30 minutes were spent on this encounter with face-to-face time and non-face-to-face time, including preparing to see the patient, ordering tests and/or medications, counseling the patient and coordination of care as outlined above.   Ledell Peoples, MD Department of Hematology/Oncology [facility]  [facility] Phone: [phone] Pager: [phone] Email: [email or web address]  05/04/2021 12:54 PM

## 2021-05-04 NOTE — Telephone Encounter (Signed)
Patient called asking about the estimate she received.  Explained to patient that we had to send estimates for patients with no insurance listed on their accounts.  Patient now has Medicaid and will make sure we have it in the system for tomorrows appointment.

## 2021-05-05 ENCOUNTER — Ambulatory Visit: Payer: Medicaid Other | Admitting: *Deleted

## 2021-05-05 ENCOUNTER — Ambulatory Visit: Payer: Medicaid Other | Attending: Obstetrics

## 2021-05-05 VITALS — BP 121/88 | HR 93

## 2021-05-05 DIAGNOSIS — Z3A36 36 weeks gestation of pregnancy: Secondary | ICD-10-CM

## 2021-05-05 DIAGNOSIS — D6851 Activated protein C resistance: Secondary | ICD-10-CM | POA: Diagnosis present

## 2021-05-05 DIAGNOSIS — Z6837 Body mass index (BMI) 37.0-37.9, adult: Secondary | ICD-10-CM | POA: Diagnosis present

## 2021-05-05 DIAGNOSIS — O99119 Other diseases of the blood and blood-forming organs and certain disorders involving the immune mechanism complicating pregnancy, unspecified trimester: Secondary | ICD-10-CM | POA: Insufficient documentation

## 2021-05-05 DIAGNOSIS — O09523 Supervision of elderly multigravida, third trimester: Secondary | ICD-10-CM | POA: Insufficient documentation

## 2021-05-05 DIAGNOSIS — O99213 Obesity complicating pregnancy, third trimester: Secondary | ICD-10-CM | POA: Insufficient documentation

## 2021-05-05 IMAGING — US US MFM OB FOLLOW-UP
1 series · 13 of 28 positions shown · non-contrast
Comparison: none

[Series 1: us mfm ob follow-up · 43 acquisitions, 13 frames shown]
[im 2/43]
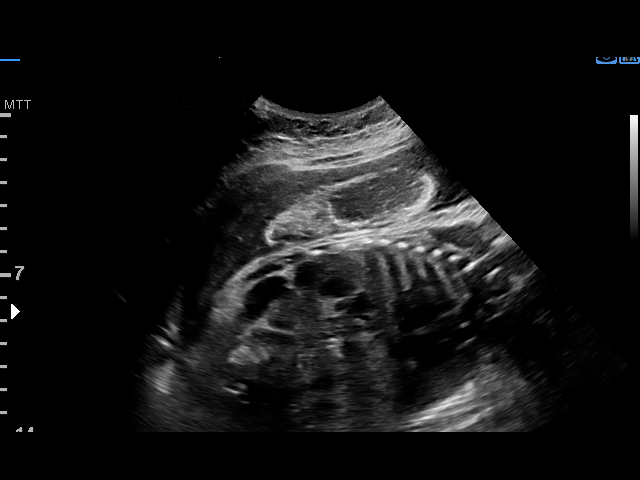
[im 5/43]
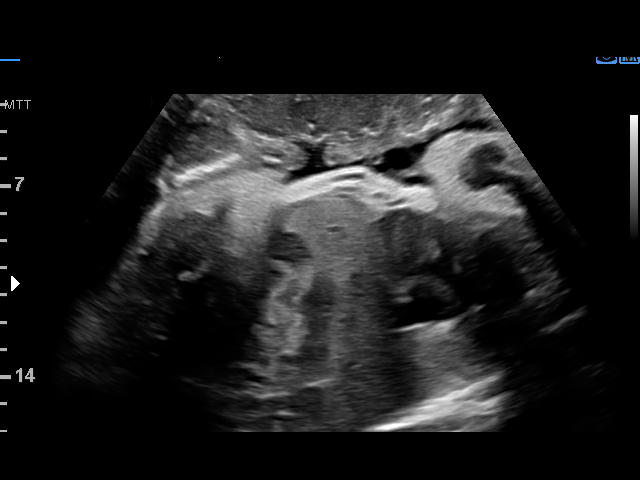
[im 8/43]
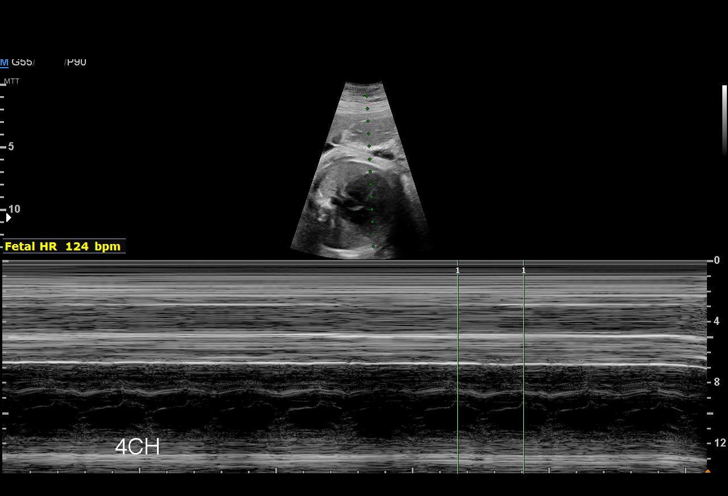
[im 11/43]
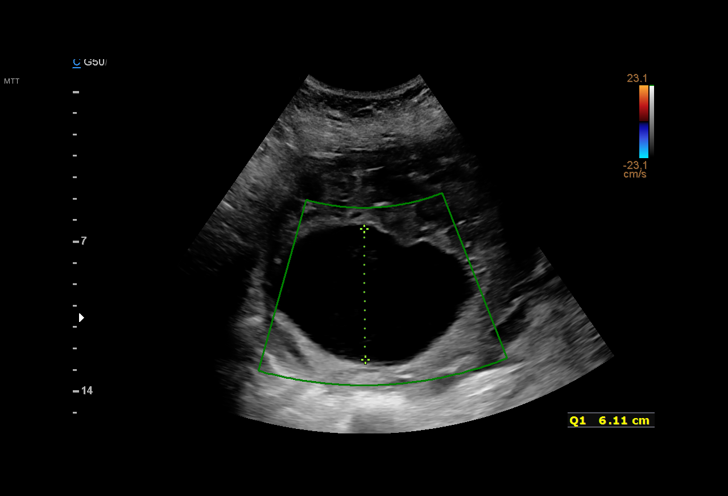
[im 15/43]
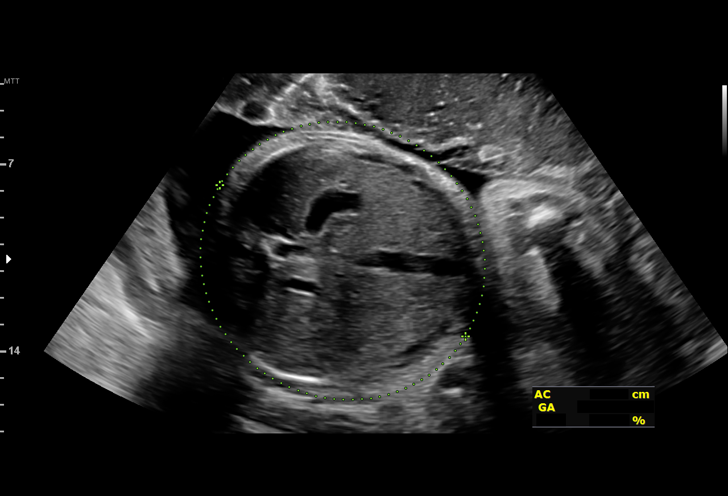
[im 18/43]
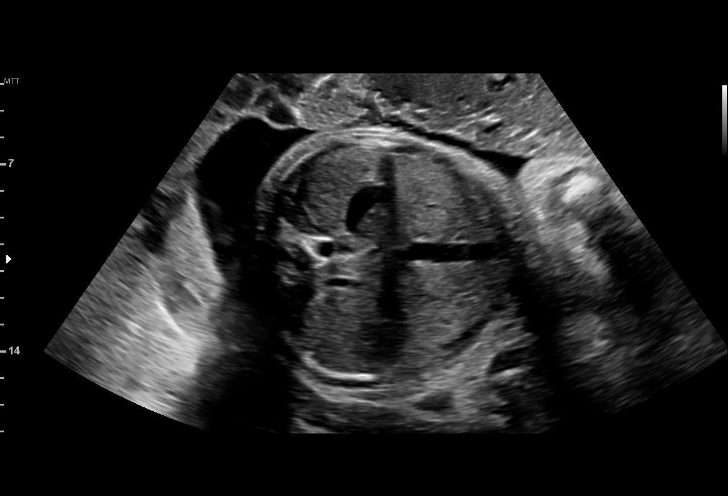
[im 22/43]
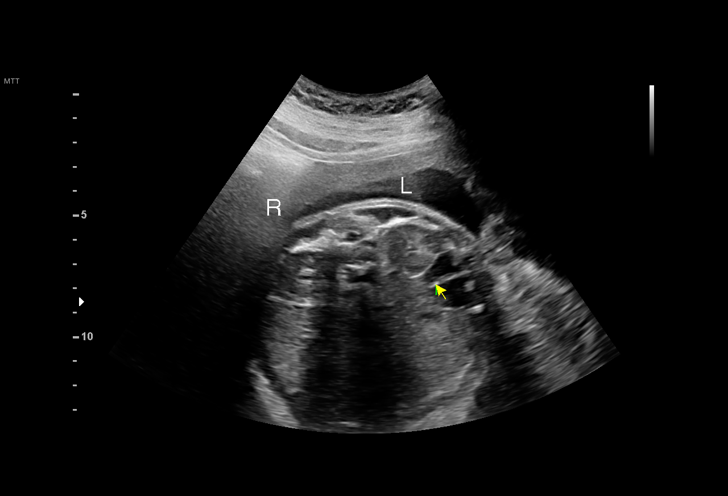
[im 25/43]
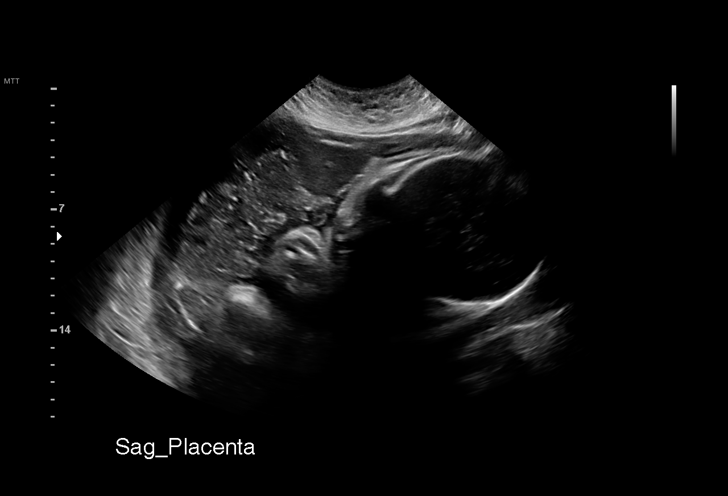
[im 29/43]
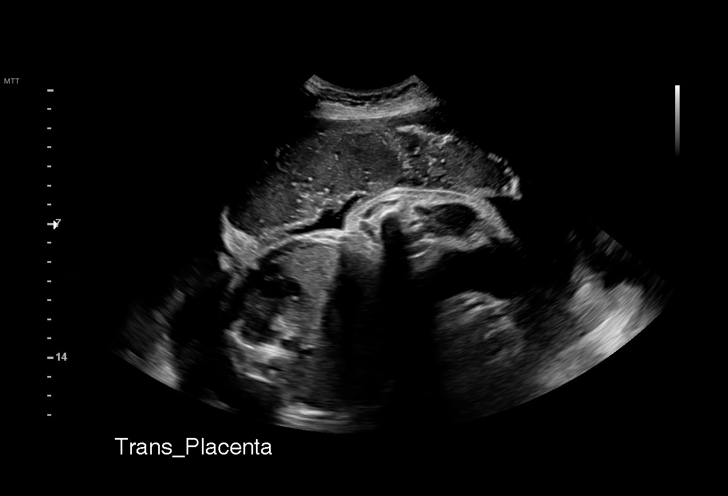
[im 32/43]
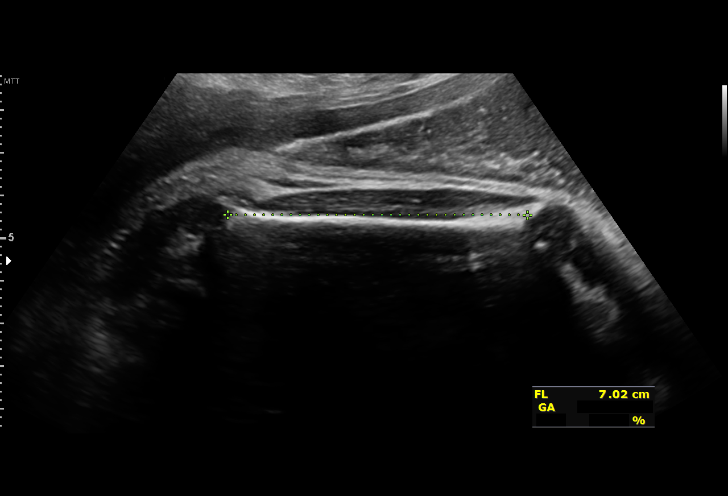
[im 35/43]
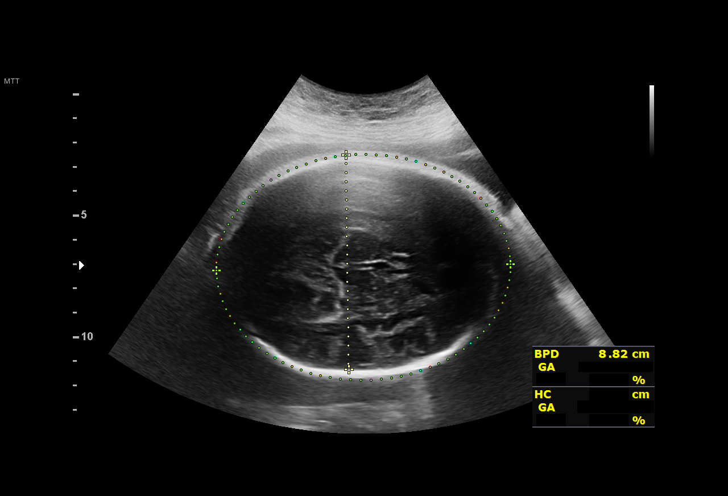
[im 38/43]
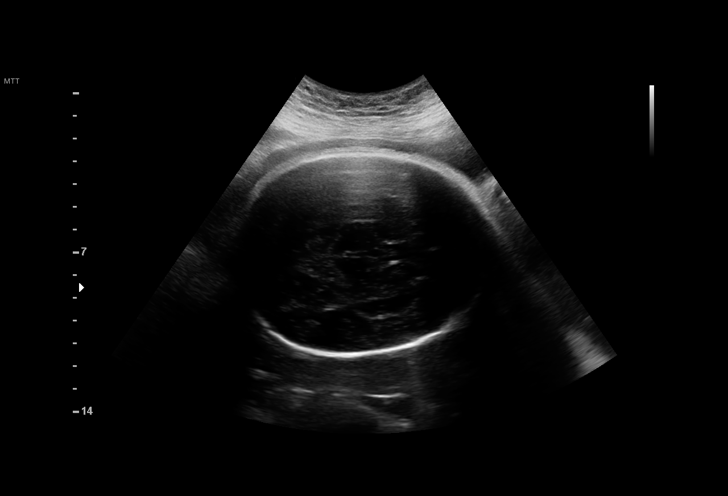
[im 41/43]
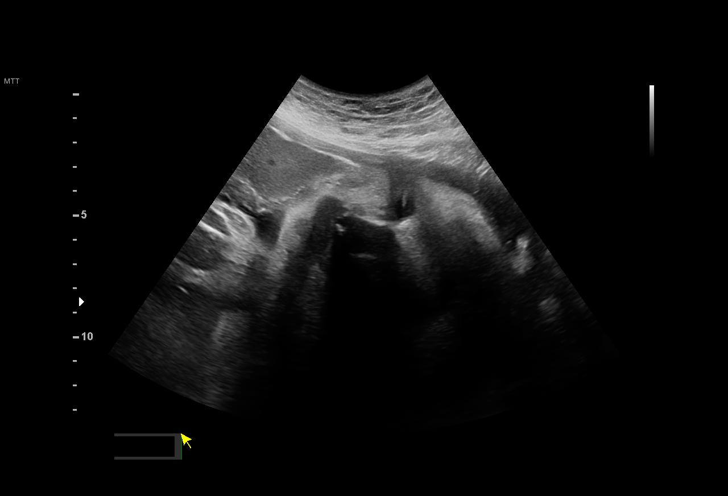

[13 of 28 positions shown; findings below may reference images not displayed]

[REDACTED]

Indications

 Medical complication of pregnancy (Factor V    [I6]
 GEBHARD, Hx DVT/PE)
 Obesity complicating pregnancy, third          [I6]
 trimester (BMI 37)
 36 weeks gestation of pregnancy
 Advanced maternal age multigravida 35+,        [I6]
 third trimester
 Anxiety during pregnancy, third trimester      O99.343, [I6]
Fetal Evaluation

 Num Of Fetuses:         1
 Fetal Heart Rate(bpm):  124
 Cardiac Activity:       Observed
 Presentation:           Cephalic
 Placenta:               Anterior
 P. Cord Insertion:      Previously Visualized

 Amniotic Fluid
 AFI FV:      Within normal limits

 AFI Sum(cm)     %Tile       Largest Pocket(cm)
 18.8            71
 RUQ(cm)       RLQ(cm)       LUQ(cm)        LLQ(cm)

Biophysical Evaluation

 Amniotic F.V:   Pocket => 2 cm             F. Tone:        Observed
 F. Movement:    Observed                   Score:          [DATE]
 F. Breathing:   Observed
Biometry

 BPD:      88.2  mm     G. Age:  35w 5d         36  %    CI:        70.82   %    70 - 86
                                                         FL/HC:      20.8   %    20.8 -
 HC:       334   mm     G. Age:  38w 1d         61  %    HC/AC:      1.02        0.92 -
 AC:      327.6  mm     G. Age:  36w 5d         65  %    FL/BPD:     78.8   %    71 - 87
 FL:       69.5  mm     G. Age:  35w 5d         24  %    FL/AC:      21.2   %    20 - 24

 Est. FW:    [I6]  gm      6 lb 8 oz     51  %
OB History

 Blood Type:   O+
 Gravidity:    3         Term:   0        Prem:   0        SAB:   2
 TOP:          0       Ectopic:  0        Living: 0
Gestational Age

 LMP:           36w 4d        Date:  [DATE]                 EDD:   [DATE]
 U/S Today:     36w 4d                                        EDD:   [DATE]
 Best:          36w 4d     Det. By:  LMP  ([DATE])          EDD:   [DATE]
Anatomy

 Cranium:               Appears normal         Aortic Arch:            Previously seen
 Cavum:                 Appears normal         Ductal Arch:            Previously seen
 Ventricles:            Previously seen        Diaphragm:              Appears normal
 Choroid Plexus:        Previously seen        Stomach:                Appears normal, left
                                                                       sided
 Cerebellum:            Previously seen        Abdomen:                Previously seen
 Posterior Fossa:       Previously seen        Abdominal Wall:         Previously seen
 Nuchal Fold:           Not applicable (>20    Cord Vessels:           Previously seen
                        wks GA)
 Face:                  Orbits and profile     Kidneys:                Appear normal
                        previously seen
 Lips:                  Previously seen        Bladder:                Appears normal
 Thoracic:              Appears normal         Spine:                  Previously seen
 Heart:                 Appears normal; EIF    Upper Extremities:      Previously seen
 RVOT:                  Previously seen        Lower Extremities:      Previously seen
 LVOT:                  Previously seen

 Other:  Fetus appears to be female. Nasal bone, Lenses, VC, 3VV and 3VTV
         previously visualized. Heels/feet and open hands/5th digits previously
         visualized. Technically difficult due to fetal position.
Cervix Uterus Adnexa
 Cervix
 Normal appearance by transabdominal scan.

 Uterus
 No abnormality visualized.

 Right Ovary
 Within normal limits.

 Left Ovary
 Not visualized.

 Cul De Sac
 No free fluid seen.

 Adnexa
 No abnormality visualized.
Comments

 This patient was seen for a follow up growth scan as she is
 heterozygous for the factor V Leiden gene mutation with a
 history of a prior thromboembolic event.  Her pregnancy has
 also been complicated by advanced maternal age and
 maternal obesity.  She denies any problems since her last
 exam.  She remains on Lovenox 150 mg daily.
 She was informed that the fetal growth and amniotic fluid
 level appears appropriate for her gestational age.
 A biophysical profile performed today was [DATE].
 In regards to the management of her anticoagulation and
 delivery, she should probably continue Lovenox up until the
 day before her scheduled delivery date, which should occur
 at around 39 weeks.  As she is treated with a high dose of
 anticoagulation, she may be eligible to receive regional
 anesthesia if it has been 24 hours or more since she received
 the anticoagulation medication.
 Due to her underlying medical conditions, she should
 continue weekly NSTs in your office until delivery.
 No further exams were scheduled in our office.

## 2021-05-07 ENCOUNTER — Ambulatory Visit (INDEPENDENT_AMBULATORY_CARE_PROVIDER_SITE_OTHER): Payer: Medicaid Other | Admitting: Internal Medicine

## 2021-05-07 ENCOUNTER — Other Ambulatory Visit: Payer: Self-pay

## 2021-05-07 ENCOUNTER — Encounter: Payer: Self-pay | Admitting: Internal Medicine

## 2021-05-07 VITALS — BP 110/90 | HR 129 | Ht 62.5 in | Wt 226.4 lb

## 2021-05-07 DIAGNOSIS — I824Y9 Acute embolism and thrombosis of unspecified deep veins of unspecified proximal lower extremity: Secondary | ICD-10-CM | POA: Diagnosis not present

## 2021-05-07 DIAGNOSIS — I2699 Other pulmonary embolism without acute cor pulmonale: Secondary | ICD-10-CM

## 2021-05-07 DIAGNOSIS — I82409 Acute embolism and thrombosis of unspecified deep veins of unspecified lower extremity: Secondary | ICD-10-CM | POA: Insufficient documentation

## 2021-05-07 DIAGNOSIS — R000 Tachycardia, unspecified: Secondary | ICD-10-CM | POA: Diagnosis not present

## 2021-05-07 DIAGNOSIS — R002 Palpitations: Secondary | ICD-10-CM | POA: Diagnosis not present

## 2021-05-07 DIAGNOSIS — D6851 Activated protein C resistance: Secondary | ICD-10-CM | POA: Insufficient documentation

## 2021-05-07 NOTE — Progress Notes (Signed)
Cardiology Office Note:    Date:  05/07/2021   ID:  Christine Wong, DOB 10-23-1985, MRN [id number]  PCP:  Medicine, [facility] Providers Cardiologist:  None     Referring MD: Candice Camp, MD   No chief complaint on file. Palpitations  History of Present Illness:    Christine Wong is a 35 y.o. 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  Christine Wong comes in with palpitations. She reports she is in the middle of something and she has to catch her breath. It feels tight. Her heart pumps fast. She reports some LH. No syncope. She gets sharp pains. She does not have limiting symptoms. She can do her daily activities. She has some SOB sometimes.She is transitioning from lovenox to heparin currently with hx of DCT/PE. She has some ankle swelling that is chronic since before her pregnancy. 05/04/2021 Duplex negative in the LE. She was recently let go from her job 04/20/2021.  She moved down here from [place]. She's been here since July. She was at [facility] prior to this for her OB work up. She follows with OB here, Dr. Parke Poisson.  She does not have high risk features including syncope c/f arrhythmia , family hx of SCD, or abnormalities on her EKG.   Father had hx of heart disease  She's a former smoker ages [age].   05/04/2021 Hgb- 12.3 Plt 324 Crt 0.67     Past Medical History:  Diagnosis Date   Anxiety    DVT (deep venous thrombosis) (HCC) 08/2019   HPV (human papilloma virus) infection    Seasonal allergies    Vaginal Pap smear, abnormal     Past Surgical History:  Procedure Laterality Date   WISDOM TOOTH EXTRACTION  2014    Current Medications: No outpatient medications have been marked as taking for the 05/07/21 encounter (Appointment) with Maisie Fus, MD.      Allergies:   Penicillins   Social History   Socioeconomic History   Marital status: Single    Spouse name: Not on file   Number of children: Not on file   Years of education: Not on file   Highest education level: Not on file  Occupational History   Not on file  Tobacco Use   Smoking status: Former    Types: Cigarettes    Quit date: 2009    Years since quitting: 13.8   Smokeless tobacco: Never  Vaping Use   Vaping Use: Never used  Substance and Sexual Activity   Alcohol use: Yes    Comment: socially   Drug use: Yes    Types: Marijuana    Comment: socially last use 9 months ago   Sexual activity: Yes    Partners: Female    Birth control/protection: None  Other Topics Concern   Not on file  Social History Narrative   Not on file   Social Determinants of Health   Financial Resource Strain: Not on file  Food Insecurity: Not on file  Transportation Needs: Not on file  Physical Activity: Not on file  Stress: Not on file  Social Connections: Not on file     Family History: The patient's family history includes Breast cancer in her mother and paternal grandmother; Colon cancer in her maternal uncle; Diabetes  in her father; Heart disease in her father; Hypertension in her father, maternal grandmother, and mother; Kidney failure in her father; Ovarian cancer in her paternal grandmother; Pancreatic cancer in her maternal grandmother.  ROS:   Please see the history of present illness.     All other systems reviewed and are negative.  EKGs/Labs/Other Studies Reviewed:    The following studies were reviewed today:   EKG:  EKG is  ordered today.  The ekg ordered today demonstrates   Sinus  tachycardia 129 bpm, no pre-excitation, no ischemic changes. Qtc  Recent Labs: 05/04/2021: ALT 9; BUN 6; Creatinine 0.67; Hemoglobin 12.3; Platelet Count 324; Potassium 4.2; Sodium 136  Recent Lipid Panel No results found for: CHOL, TRIG, HDL, CHOLHDL, VLDL, LDLCALC,  LDLDIRECT   Risk Assessment/Calculations:           Physical Exam:    VS:  Vitals:   05/07/21 1405  BP: 110/90  Pulse: (!) 129  SpO2: 95%       LMP 08/22/2020     Wt Readings from Last 3 Encounters:  05/04/21 226 lb (102.5 kg)  03/02/21 218 lb 8 oz (99.1 kg)  01/13/17 210 lb 1.6 oz (95.3 kg)     GEN:  Well nourished, well developed in no acute distress HEENT: Normal NECK: No JVD;  LYMPHATICS: No lymphadenopathy CARDIAC:tachycardic , no murmurs, rubs, gallops RESPIRATORY:  Clear to auscultation without rales, wheezing or rhonchi  ABDOMEN: Soft, non-tender, non-distended MUSCULOSKELETAL:  mild non pitting edema BL LE; No deformity  SKIN: Warm and dry NEUROLOGIC:  Alert and oriented x 3 PSYCHIATRIC:  Normal affect   ASSESSMENT:    #Sinus Tachycardia: She is symptomatic with palpitations and some sob. Although her duplex was normal, PE is still a possibility. Although cardiac output and HR can increase with pregnancy, her rates are fairly high at 129 bpm. She is hypercoagulable with Factor V and pregnancy. Her management would not necessarily change; however it's important to identify if she is having RV strain if she does have a PE.  PLAN:    In order of problems listed above:   VQ scan TTE Follow up in 3 months      Medication Adjustments/Labs and Tests Ordered: Current medicines are reviewed at length with the patient today.  Concerns regarding medicines are outlined above.   Signed, Maisie Fus, MD  05/07/2021 9:21 AM    [facility]

## 2021-05-07 NOTE — Patient Instructions (Signed)
Medication Instructions:  No Changes In Medications at this time.  *If you need a refill on your cardiac medications before your next appointment, please call your pharmacy*  Testing/Procedures: VQ Scan- PLEASE SCHEDULE THIS AS SOON AS POSSIBLE  Your physician has requested that you have an echocardiogram PLEASE SCHEDULE THIS AT NEXT AVAILABLE. Echocardiography is a painless test that uses sound waves to create images of your heart. It provides your doctor with information about the size and shape of your heart and how well your heart's chambers and valves are working. You may receive an ultrasound enhancing agent through an IV if needed to better visualize your heart during the echo.This procedure takes approximately one hour. There are no restrictions for this procedure. This will take place at the [address].    Follow-Up: At [facility], you and your health needs are our priority.  As part of our continuing mission to provide you with exceptional heart care, we have created designated Provider Care Teams.  These Care Teams include your primary Cardiologist (physician) and Advanced Practice Providers (APPs -  Physician Assistants and Nurse Practitioners) who all work together to provide you with the care you need, when you need it.  Your next appointment:   3 month(s)  The format for your next appointment:   In Person  Provider:   Maisie Fus, MD

## 2021-05-11 ENCOUNTER — Other Ambulatory Visit: Payer: Self-pay

## 2021-05-11 ENCOUNTER — Ambulatory Visit
Admission: RE | Admit: 2021-05-11 | Discharge: 2021-05-11 | Disposition: A | Discharge status: Home / Self Care | Payer: Medicaid Other | Source: Ambulatory Visit | Attending: Internal Medicine | Admitting: Internal Medicine

## 2021-05-11 DIAGNOSIS — R002 Palpitations: Secondary | ICD-10-CM

## 2021-05-11 DIAGNOSIS — R000 Tachycardia, unspecified: Secondary | ICD-10-CM

## 2021-05-11 IMAGING — CR DG CHEST 2V
2 series · 2 of 2 positions shown · non-contrast
Comparison: None.

CLINICAL DATA: Shortness of breath

EXAM:
CHEST - 2 VIEW

[w chest pa]
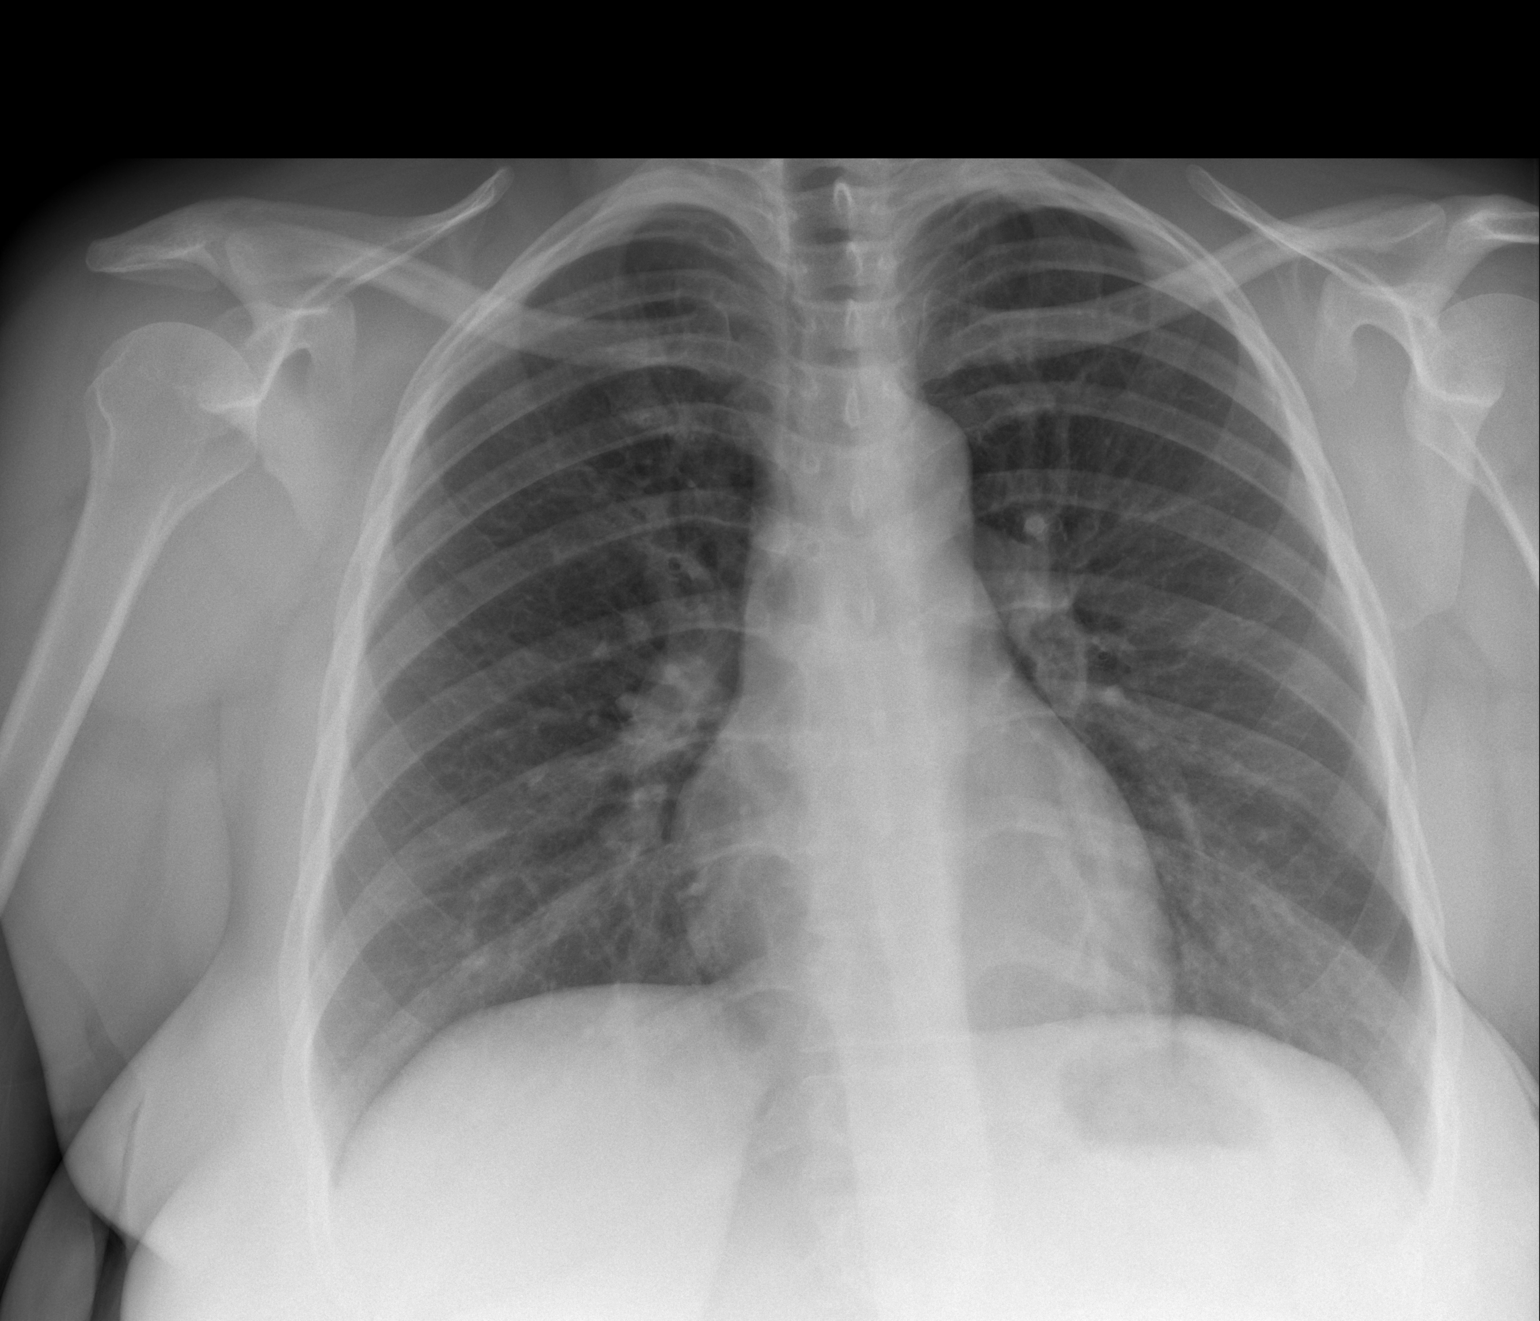

[w chest lat]
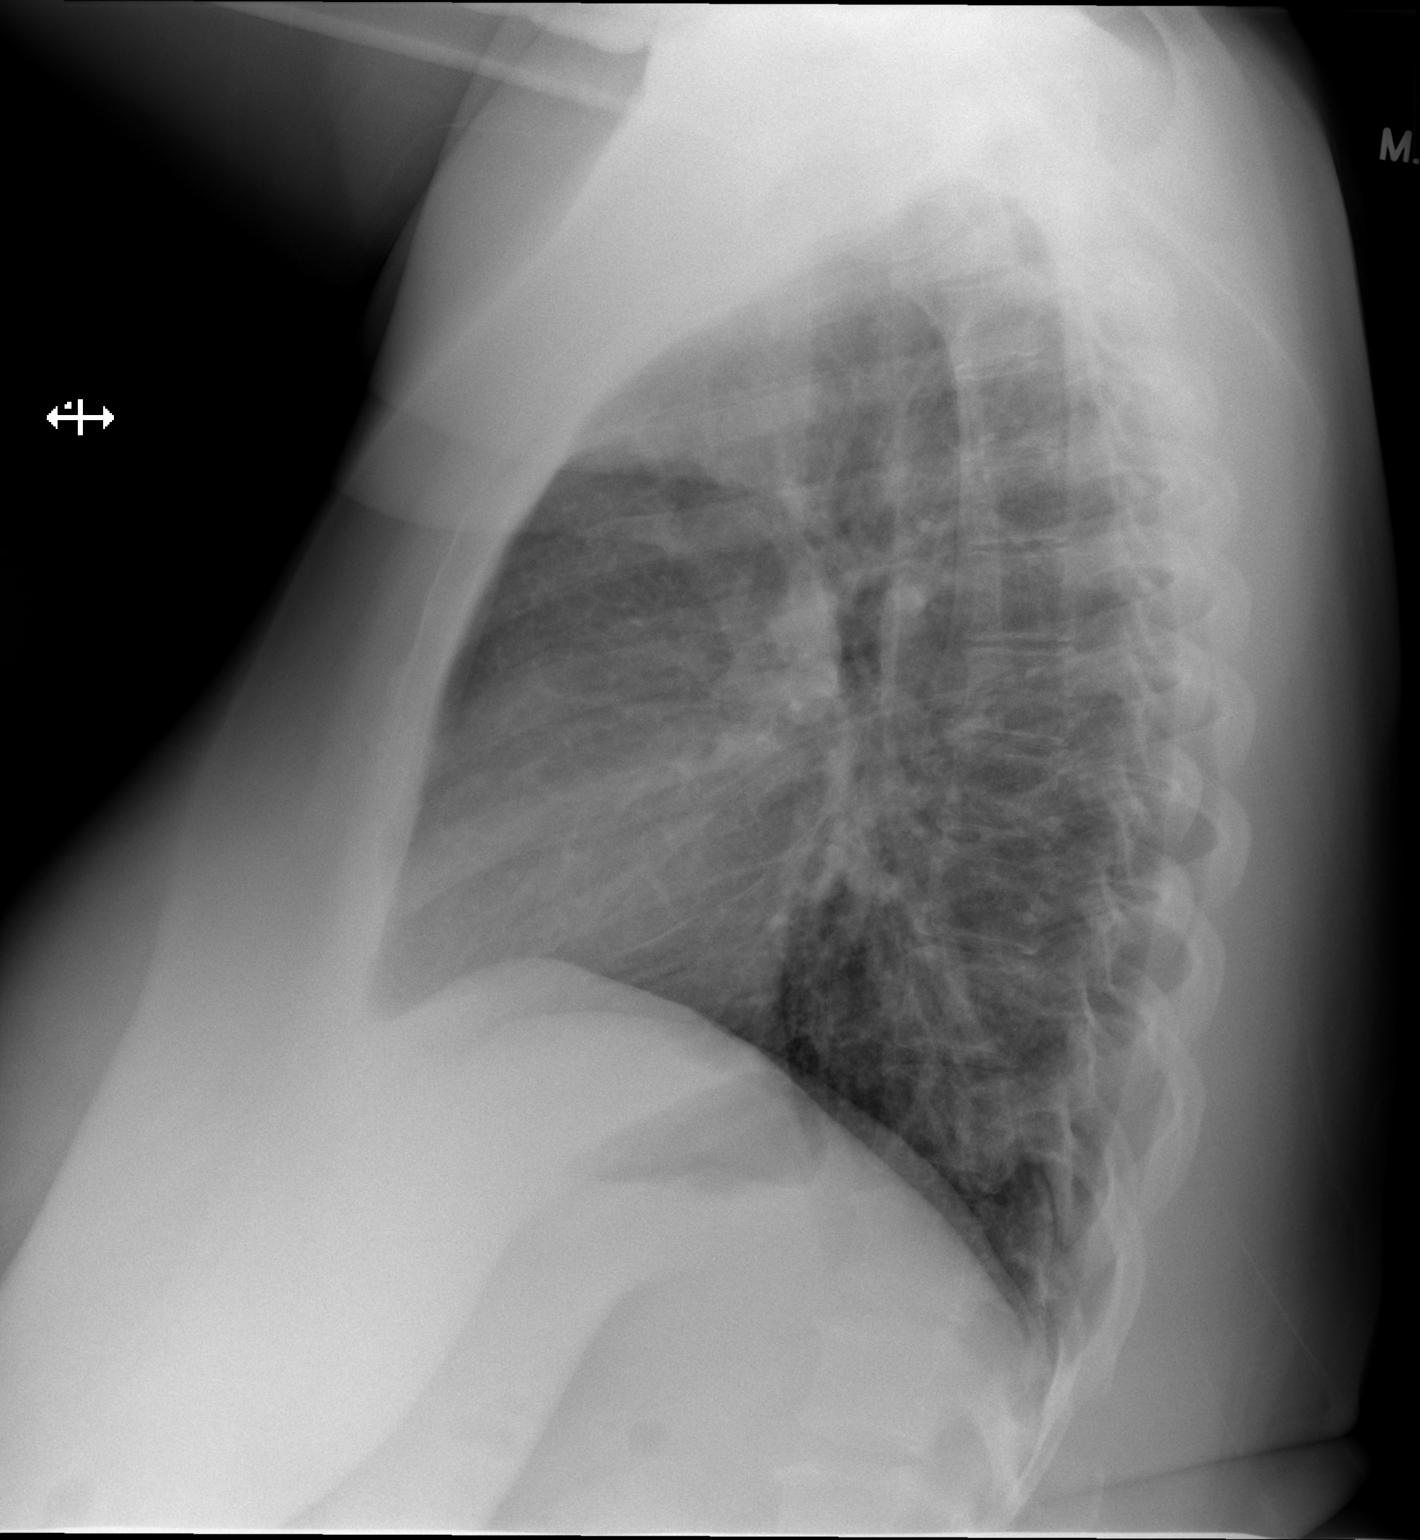

[2 of 2 positions shown; findings below may reference images not displayed]

FINDINGS: The heart size and mediastinal contours are within normal limits.
Both lungs are clear. The visualized skeletal structures are
unremarkable.
IMPRESSION: No active cardiopulmonary disease.

## 2021-05-12 ENCOUNTER — Ambulatory Visit (HOSPITAL_COMMUNITY): Payer: Medicaid Other | Attending: Internal Medicine

## 2021-05-12 DIAGNOSIS — R000 Tachycardia, unspecified: Secondary | ICD-10-CM | POA: Insufficient documentation

## 2021-05-12 DIAGNOSIS — R011 Cardiac murmur, unspecified: Secondary | ICD-10-CM | POA: Diagnosis not present

## 2021-05-12 DIAGNOSIS — R002 Palpitations: Secondary | ICD-10-CM

## 2021-05-12 LAB — ECHOCARDIOGRAM COMPLETE
Area-P 1/2: 4.15 cm2
S' Lateral: 3.1 cm

## 2021-05-12 NOTE — Addendum Note (Signed)
Addended by: Darene Lamer T on: 05/12/2021 10:42 AM   Modules accepted: Orders

## 2021-05-12 NOTE — Addendum Note (Signed)
Addended by: Darene Lamer T on: 05/12/2021 01:51 PM   Modules accepted: Orders

## 2021-05-13 ENCOUNTER — Other Ambulatory Visit (HOSPITAL_COMMUNITY): Payer: Self-pay

## 2021-05-13 MED ORDER — HEPARIN SODIUM (PORCINE) 10000 UNIT/ML IJ SOLN
10000.0000 [IU] | Freq: Two times a day (BID) | INTRAMUSCULAR | 1 refills | Status: DC
  Filled 2021-05-13 (×2): qty 60, 30d supply, fill #0

## 2021-05-15 ENCOUNTER — Encounter (HOSPITAL_COMMUNITY): Payer: Medicaid Other | Attending: Internal Medicine

## 2021-05-15 ENCOUNTER — Ambulatory Visit (HOSPITAL_COMMUNITY): Admission: RE | Admit: 2021-05-15 | Payer: Medicaid Other | Source: Ambulatory Visit

## 2021-05-15 ENCOUNTER — Other Ambulatory Visit (HOSPITAL_COMMUNITY): Payer: Self-pay

## 2021-05-19 ENCOUNTER — Other Ambulatory Visit: Payer: Self-pay

## 2021-05-19 ENCOUNTER — Inpatient Hospital Stay (HOSPITAL_COMMUNITY)
Admission: AD | Admit: 2021-05-19 | Discharge: 2021-05-25 | DRG: 786 | Disposition: A | Discharge status: Home / Self Care | Payer: Medicaid Other | Attending: Obstetrics & Gynecology | Admitting: Obstetrics & Gynecology

## 2021-05-19 ENCOUNTER — Emergency Department (HOSPITAL_COMMUNITY): Payer: Medicaid Other

## 2021-05-19 ENCOUNTER — Encounter (HOSPITAL_COMMUNITY): Payer: Self-pay | Admitting: Emergency Medicine

## 2021-05-19 DIAGNOSIS — E785 Hyperlipidemia, unspecified: Secondary | ICD-10-CM | POA: Diagnosis present

## 2021-05-19 DIAGNOSIS — O134 Gestational [pregnancy-induced] hypertension without significant proteinuria, complicating childbirth: Secondary | ICD-10-CM | POA: Diagnosis present

## 2021-05-19 DIAGNOSIS — Z880 Allergy status to penicillin: Secondary | ICD-10-CM

## 2021-05-19 DIAGNOSIS — O99214 Obesity complicating childbirth: Secondary | ICD-10-CM | POA: Diagnosis present

## 2021-05-19 DIAGNOSIS — O139 Gestational [pregnancy-induced] hypertension without significant proteinuria, unspecified trimester: Secondary | ICD-10-CM | POA: Diagnosis present

## 2021-05-19 DIAGNOSIS — O99284 Endocrine, nutritional and metabolic diseases complicating childbirth: Secondary | ICD-10-CM | POA: Diagnosis present

## 2021-05-19 DIAGNOSIS — Z86718 Personal history of other venous thrombosis and embolism: Secondary | ICD-10-CM | POA: Diagnosis not present

## 2021-05-19 DIAGNOSIS — O9942 Diseases of the circulatory system complicating childbirth: Principal | ICD-10-CM | POA: Diagnosis present

## 2021-05-19 DIAGNOSIS — O99354 Diseases of the nervous system complicating childbirth: Secondary | ICD-10-CM | POA: Diagnosis present

## 2021-05-19 DIAGNOSIS — D6851 Activated protein C resistance: Secondary | ICD-10-CM | POA: Diagnosis present

## 2021-05-19 DIAGNOSIS — R4781 Slurred speech: Secondary | ICD-10-CM

## 2021-05-19 DIAGNOSIS — Z86711 Personal history of pulmonary embolism: Secondary | ICD-10-CM | POA: Diagnosis not present

## 2021-05-19 DIAGNOSIS — Z98891 History of uterine scar from previous surgery: Secondary | ICD-10-CM

## 2021-05-19 DIAGNOSIS — Z20822 Contact with and (suspected) exposure to covid-19: Secondary | ICD-10-CM | POA: Diagnosis present

## 2021-05-19 DIAGNOSIS — G510 Bell's palsy: Secondary | ICD-10-CM | POA: Diagnosis present

## 2021-05-19 DIAGNOSIS — Z7901 Long term (current) use of anticoagulants: Secondary | ICD-10-CM

## 2021-05-19 DIAGNOSIS — R2990 Unspecified symptoms and signs involving the nervous system: Secondary | ICD-10-CM | POA: Diagnosis not present

## 2021-05-19 DIAGNOSIS — R200 Anesthesia of skin: Secondary | ICD-10-CM

## 2021-05-19 DIAGNOSIS — R29704 NIHSS score 4: Secondary | ICD-10-CM | POA: Diagnosis not present

## 2021-05-19 DIAGNOSIS — I639 Cerebral infarction, unspecified: Secondary | ICD-10-CM | POA: Diagnosis present

## 2021-05-19 DIAGNOSIS — G43909 Migraine, unspecified, not intractable, without status migrainosus: Secondary | ICD-10-CM | POA: Diagnosis present

## 2021-05-19 DIAGNOSIS — O9912 Other diseases of the blood and blood-forming organs and certain disorders involving the immune mechanism complicating childbirth: Secondary | ICD-10-CM | POA: Diagnosis present

## 2021-05-19 DIAGNOSIS — Z3A38 38 weeks gestation of pregnancy: Secondary | ICD-10-CM

## 2021-05-19 DIAGNOSIS — Z87891 Personal history of nicotine dependence: Secondary | ICD-10-CM

## 2021-05-19 DIAGNOSIS — O99824 Streptococcus B carrier state complicating childbirth: Secondary | ICD-10-CM | POA: Diagnosis present

## 2021-05-19 LAB — COMPREHENSIVE METABOLIC PANEL
ALT: 13 U/L (ref 0–44)
AST: 16 U/L (ref 15–41)
Albumin: 2.9 g/dL — ABNORMAL LOW (ref 3.5–5.0)
Alkaline Phosphatase: 239 U/L — ABNORMAL HIGH (ref 38–126)
Anion gap: 8 (ref 5–15)
BUN: 6 mg/dL (ref 6–20)
CO2: 23 mmol/L (ref 22–32)
Calcium: 10 mg/dL (ref 8.9–10.3)
Chloride: 103 mmol/L (ref 98–111)
Creatinine, Ser: 0.66 mg/dL (ref 0.44–1.00)
GFR, Estimated: >60 mL/min (ref >60)
Glucose, Bld: 69 mg/dL — ABNORMAL LOW (ref 70–99)
Potassium: 4.6 mmol/L (ref 3.5–5.1)
Sodium: 134 mmol/L — ABNORMAL LOW (ref 135–145)
Total Bilirubin: 0.3 mg/dL (ref 0.3–1.2)
Total Protein: 6.7 g/dL (ref 6.5–8.1)

## 2021-05-19 LAB — RESP PANEL BY RT-PCR (FLU A&B, COVID) ARPGX2
Influenza A by PCR: NEGATIVE
Influenza B by PCR: NEGATIVE
SARS Coronavirus 2 by RT PCR: NEGATIVE

## 2021-05-19 LAB — CBC
HCT: 41.7 % (ref 36.0–46.0)
Hemoglobin: 13.8 g/dL (ref 12.0–15.0)
MCH: 29 pg (ref 26.0–34.0)
MCHC: 33.1 g/dL (ref 30.0–36.0)
MCV: 87.6 fL (ref 80.0–100.0)
Platelets: 362 10*3/uL (ref 150–400)
RBC: 4.76 MIL/uL (ref 3.87–5.11)
RDW: 15.5 % (ref 11.5–15.5)
WBC: 10.3 10*3/uL (ref 4.0–10.5)
nRBC: 0 % (ref 0.0–0.2)

## 2021-05-19 LAB — LIPID PANEL
Cholesterol: 316 mg/dL — ABNORMAL HIGH (ref 0–200)
HDL: 111 mg/dL (ref >40)
LDL Cholesterol: 161 mg/dL — ABNORMAL HIGH (ref 0–99)
Total CHOL/HDL Ratio: 2.8 RATIO
Triglycerides: 219 mg/dL — ABNORMAL HIGH (ref <150)
VLDL: 44 mg/dL — ABNORMAL HIGH (ref 0–40)

## 2021-05-19 LAB — DIFFERENTIAL
Abs Immature Granulocytes: 0.08 10*3/uL — ABNORMAL HIGH (ref 0.00–0.07)
Basophils Absolute: 0.1 10*3/uL (ref 0.0–0.1)
Basophils Relative: 1 %
Eosinophils Absolute: 0.2 10*3/uL (ref 0.0–0.5)
Eosinophils Relative: 2 %
Immature Granulocytes: 1 %
Lymphocytes Relative: 12 %
Lymphs Abs: 1.3 10*3/uL (ref 0.7–4.0)
Monocytes Absolute: 0.9 10*3/uL (ref 0.1–1.0)
Monocytes Relative: 9 %
Neutro Abs: 7.8 10*3/uL — ABNORMAL HIGH (ref 1.7–7.7)
Neutrophils Relative %: 75 %

## 2021-05-19 LAB — I-STAT CHEM 8, ED
BUN: 5 mg/dL — ABNORMAL LOW (ref 6–20)
Calcium, Ion: 1.25 mmol/L (ref 1.15–1.40)
Chloride: 105 mmol/L (ref 98–111)
Creatinine, Ser: 0.6 mg/dL (ref 0.44–1.00)
Glucose, Bld: 70 mg/dL (ref 70–99)
HCT: 41 % (ref 36.0–46.0)
Hemoglobin: 13.9 g/dL (ref 12.0–15.0)
Potassium: 4.5 mmol/L (ref 3.5–5.1)
Sodium: 134 mmol/L — ABNORMAL LOW (ref 135–145)
TCO2: 24 mmol/L (ref 22–32)

## 2021-05-19 LAB — I-STAT BETA HCG BLOOD, ED (MC, WL, AP ONLY)
I-stat hCG, quantitative: >2000 m[IU]/mL — ABNORMAL HIGH (ref <5)
I-stat hCG, quantitative: >2000 m[IU]/mL — ABNORMAL HIGH (ref <5)

## 2021-05-19 LAB — PROTIME-INR
INR: 0.9 (ref 0.8–1.2)
Prothrombin Time: 12.3 seconds (ref 11.4–15.2)

## 2021-05-19 LAB — APTT: aPTT: 37 seconds — ABNORMAL HIGH (ref 24–36)

## 2021-05-19 IMAGING — MR MR HEAD W/O CM
9 of 10 series · 37 of 48 positions shown · non-contrast
Comparison: Head CT [DATE]

CLINICAL DATA: Neuro deficit, acute, stroke suspected. Left facial
droop, slight left arm drift, left ear pain, and decreased sensation
in the left face.



[Series 4: DWI · axial · 3.0mm · 1.09mm/px · z∈[-43,+103]mm · 11 of 100 slices shown (1 of 4)]
[im 1/100]
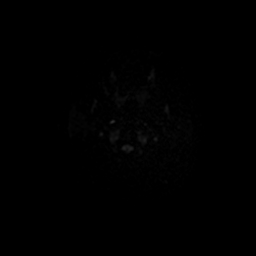
[im 10/100]
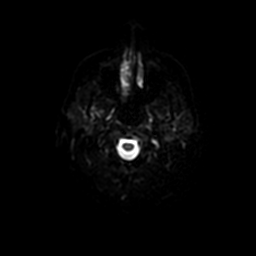
[im 20/100]
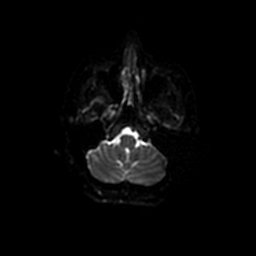
[im 30/100]
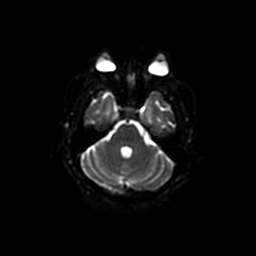
[im 40/100]
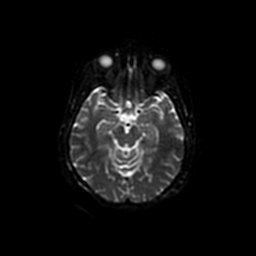
[im 50/100]
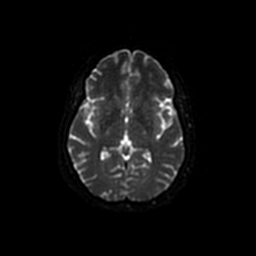
[im 60/100]
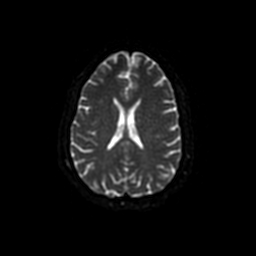
[im 70/100]
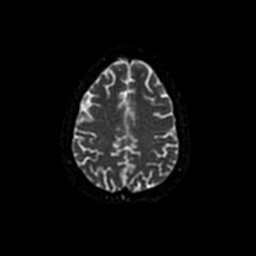
[im 80/100]
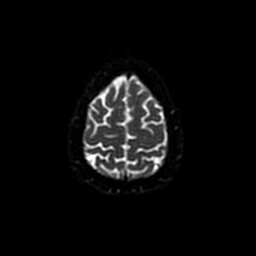
[im 90/100]
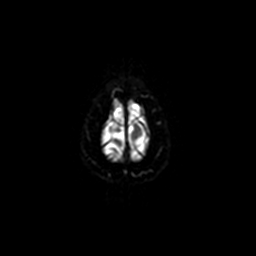
[im 100/100]
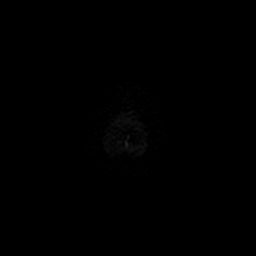

[Series 5: DWI · coronal · 5.0mm · 1.09mm/px · 8 of 72 slices shown (2 of 4)]
[im 1/72]
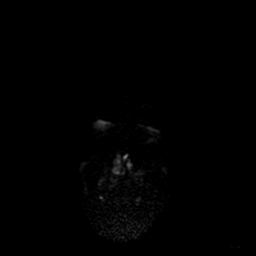
[im 11/72]
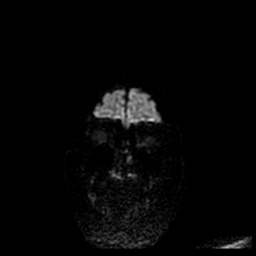
[im 21/72]
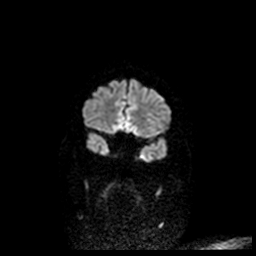
[im 31/72]
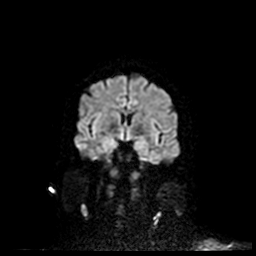
[im 41/72]
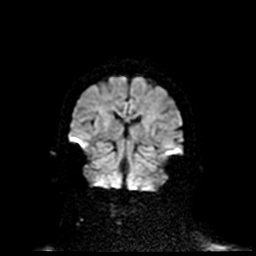
[im 51/72]
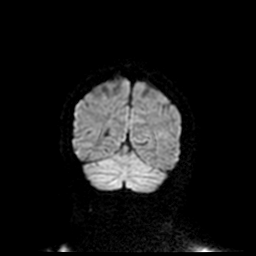
[im 61/72]
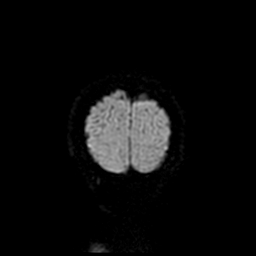
[im 72/72]
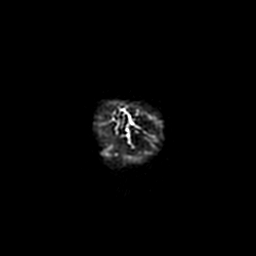

[Series 6: FLAIR · axial · 3.0mm · 0.43mm/px · z∈[-41,+96]mm · 2 of 24 slices shown]
[im 1/24]
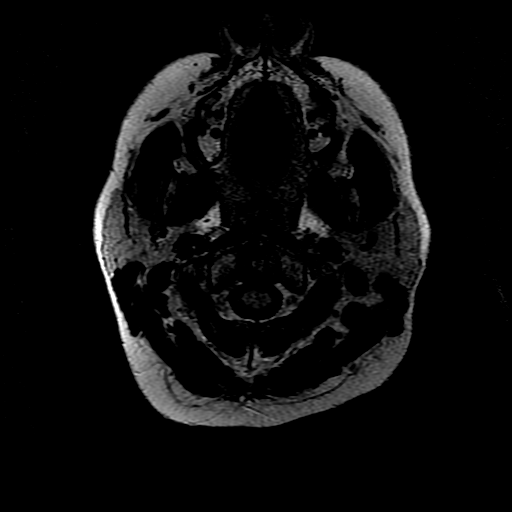
[im 24/24]
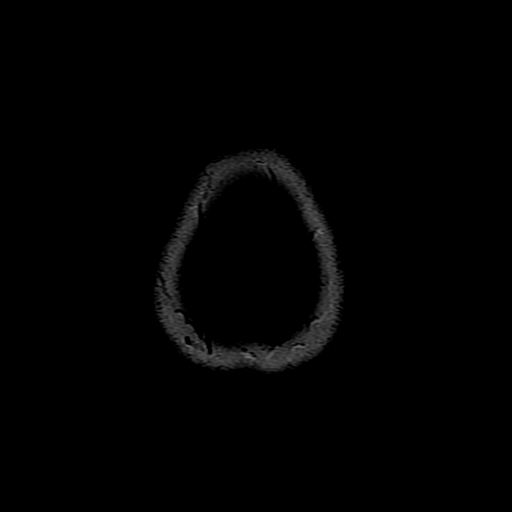

[Series 8: T1 · sagittal · 5.0mm · 0.47mm/px · 2 of 23 slices shown (1 of 2)]
[im 1/23]
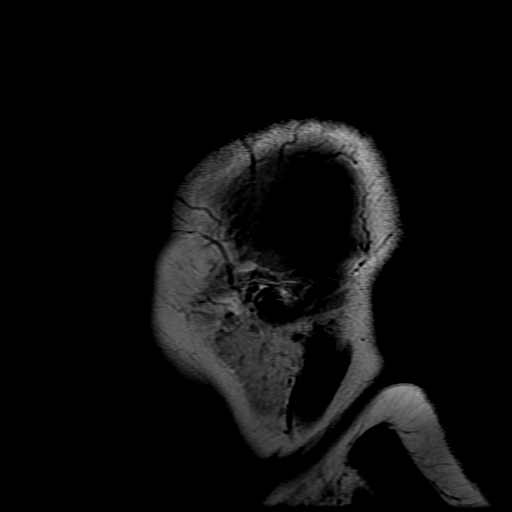
[im 23/23]
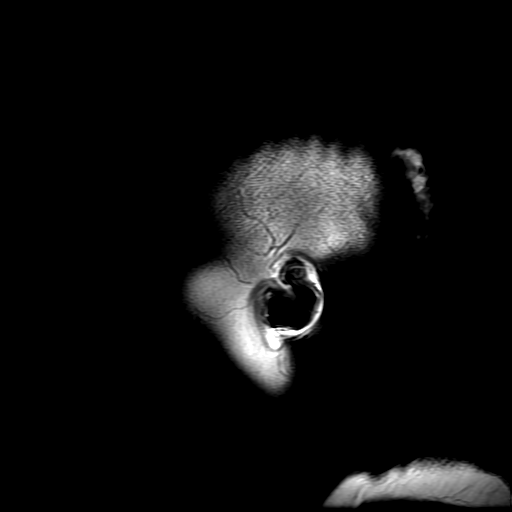

[Series 9: T2 · axial · 5.0mm · 0.43mm/px · z∈[-41,+96]mm · 2 of 24 slices shown (1 of 2)]
[im 1/24]
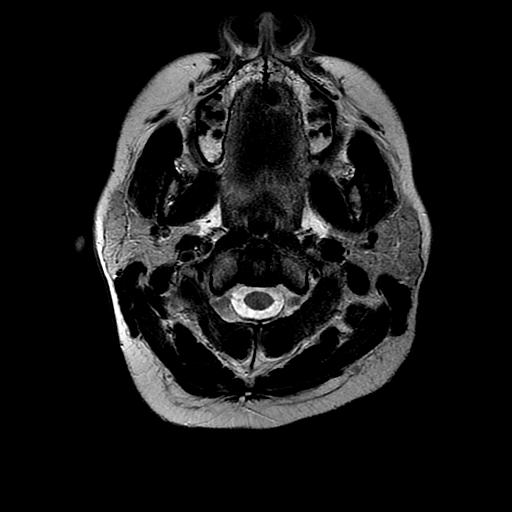
[im 24/24]
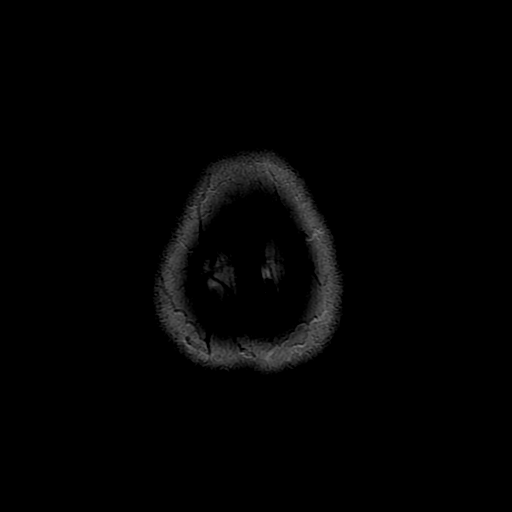

[Series 10: T1 · axial · 3.0mm · 0.43mm/px · 1 of 96 slices shown (2 of 2)]
[im 1/96]
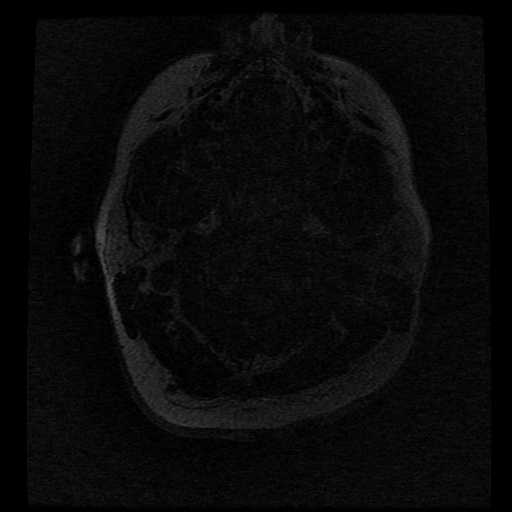

[Series 11: T2 · coronal · 5.0mm · 0.39mm/px · 2 of 24 slices shown (2 of 2)]
[im 1/24]
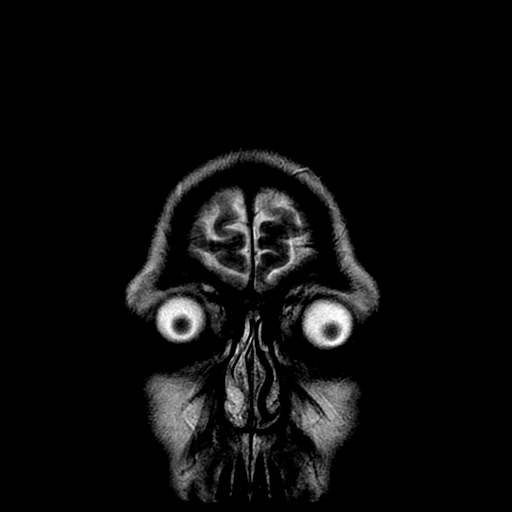
[im 24/24]
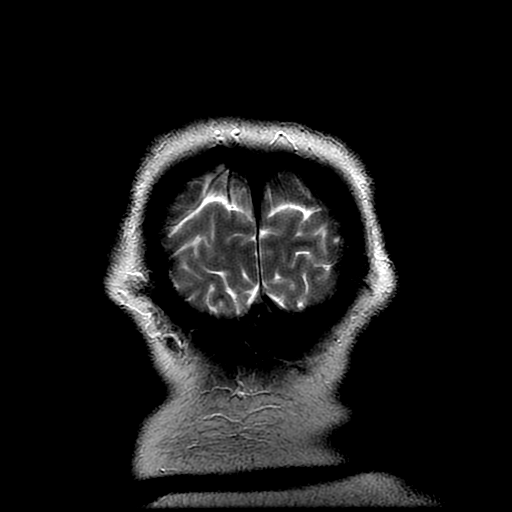

[Series 400: DWI · axial · 3.0mm · 1.09mm/px · z∈[-43,+103]mm · 5 of 50 slices shown (3 of 4)]
[im 1/50]
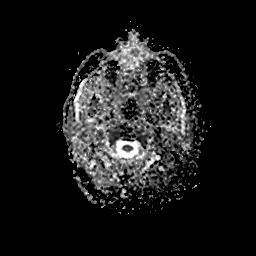
[im 13/50]
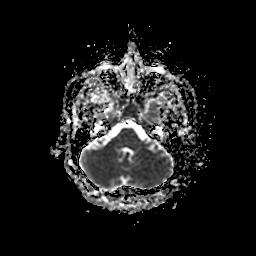
[im 25/50]
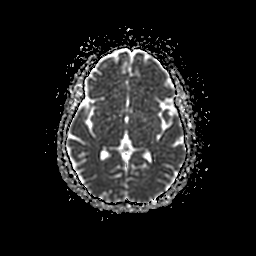
[im 37/50]
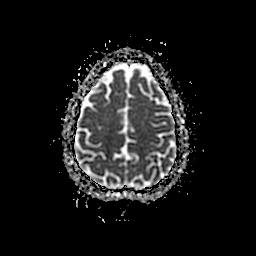
[im 50/50]
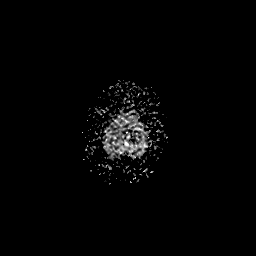

[Series 500: DWI · coronal · 5.0mm · 1.09mm/px · 4 of 36 slices shown (4 of 4)]
[im 1/36]
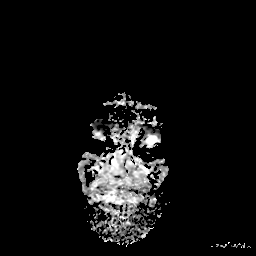
[im 12/36]
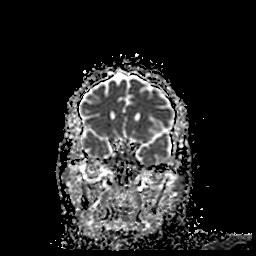
[im 24/36]
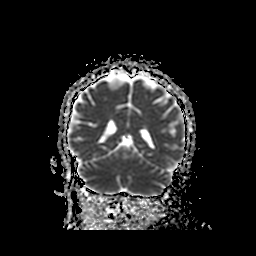
[im 36/36]
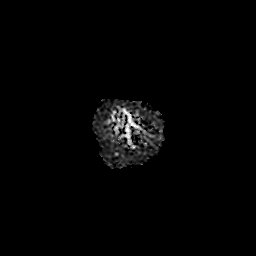

[37 of 48 positions shown; findings below may reference images not displayed]

FINDINGS: MRI HEAD FINDINGS

Brain: There is no evidence of an acute infarct, intracranial
hemorrhage, mass, midline shift, or extra-axial fluid collection.
The ventricles and sulci are normal. The brain is normal in signal.
A dilated perivascular space is incidentally noted inferiorly in the
right basal ganglia. There is also an incidental subcentimeter right
choroidal fissure cyst.

Vascular: Major intracranial vascular flow voids are preserved.

Skull and upper cervical spine: Unremarkable bone marrow signal.

Sinuses/Orbits: Unremarkable orbits. Paranasal sinuses and mastoid
air cells are clear.

Other: None.

MRA HEAD FINDINGS

The intracranial vertebral arteries are widely patent to the basilar
and codominant. Patent PICA and SCA origins are seen bilaterally.
The basilar artery is widely patent. There is a small left posterior
communicating artery. Both PCAs are patent without evidence of a
significant proximal stenosis.

The internal carotid arteries are widely patent from skull base to
carotid termini. ACAs and MCAs are patent without evidence of a
proximal branch occlusion or significant proximal stenosis. No
aneurysm is identified.

MRA NECK FINDINGS

There is mild motion artifact.

There is a standard 3 vessel aortic arch with widely patent arch
vessel origins. The common carotid and cervical internal carotid
arteries are patent and smooth bilaterally without evidence of a
significant stenosis or dissection. The vertebral arteries are
patent and codominant with antegrade flow and no evidence of a
significant stenosis or dissection.
IMPRESSION: Negative head MRI, head MRA, and neck MRA.

These results were called by telephone at the time of interpretation
on [DATE] at [DATE] to Dr. JAMAL, who verbally
acknowledged these results.

## 2021-05-19 IMAGING — MR MR MRA HEAD W/O CM
1 series · 18 of 48 positions shown · non-contrast
Comparison: Head CT [DATE]

CLINICAL DATA: Neuro deficit, acute, stroke suspected. Left facial
droop, slight left arm drift, left ear pain, and decreased sensation
in the left face.



[Series 3: (id) mt fs · axial · 1.4mm · 0.43mm/px · z∈[-52,+38]mm · 18 of 136 slices shown]
[im 1/136]
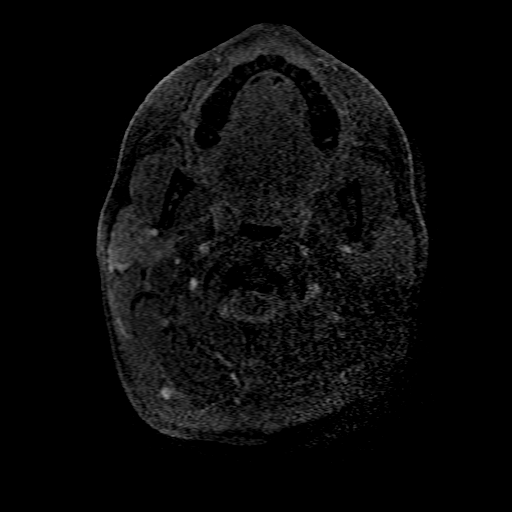
[im 3/136]
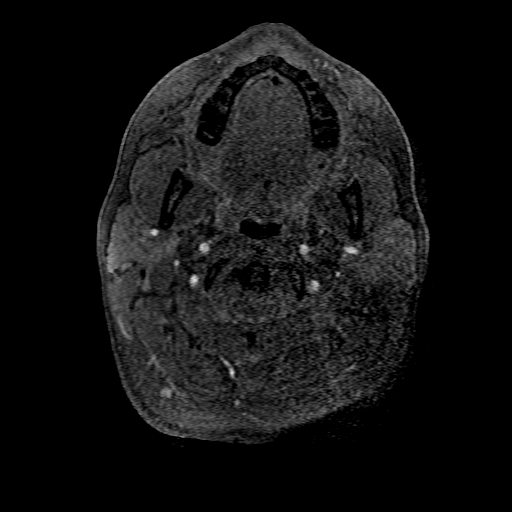
[im 6/136]
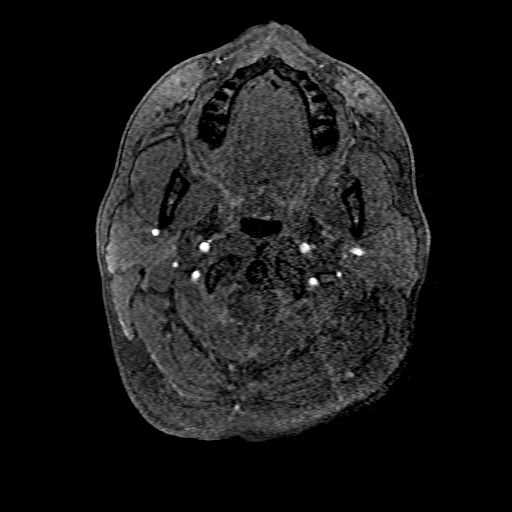
[im 9/136]
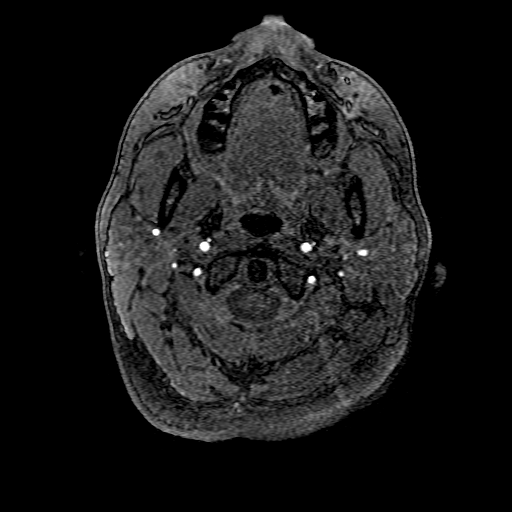
[im 12/136]
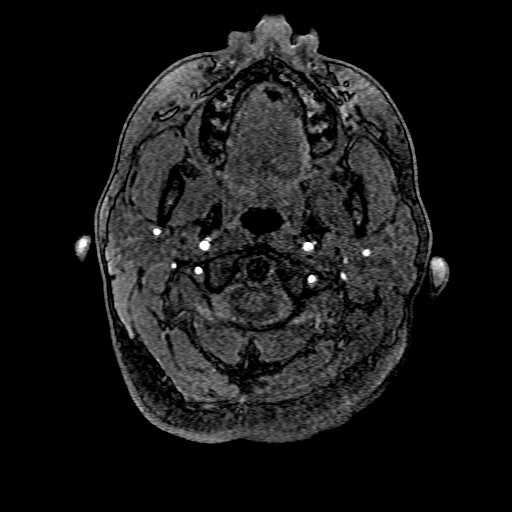
[im 15/136]
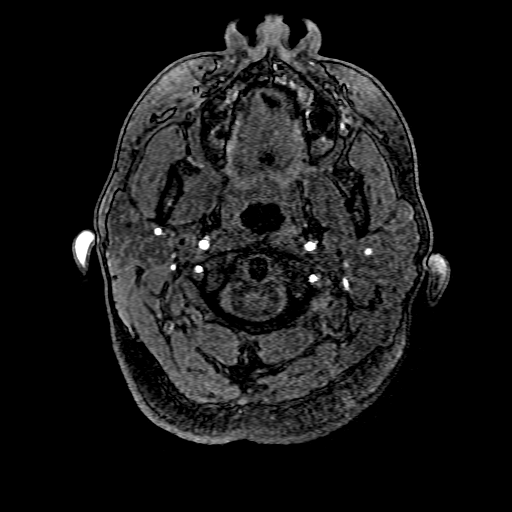
[im 18/136]
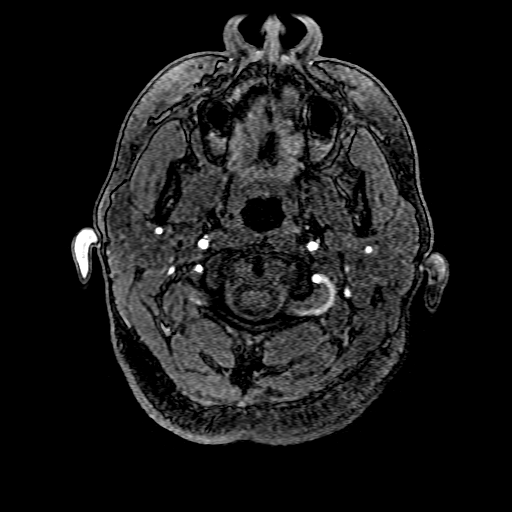
[im 21/136]
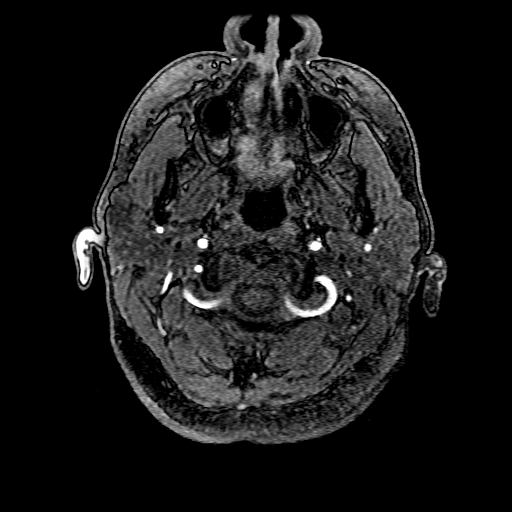
[im 23/136]
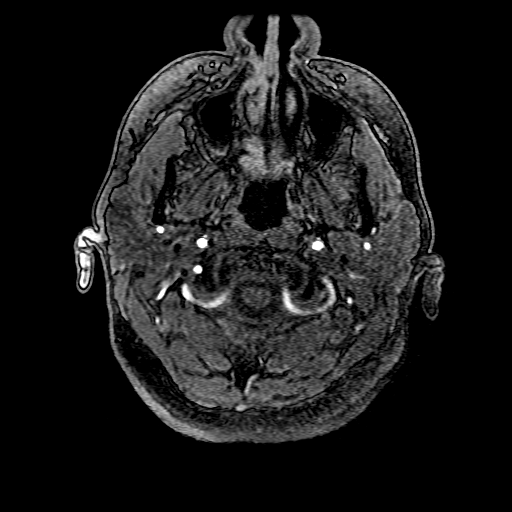
[im 26/136]
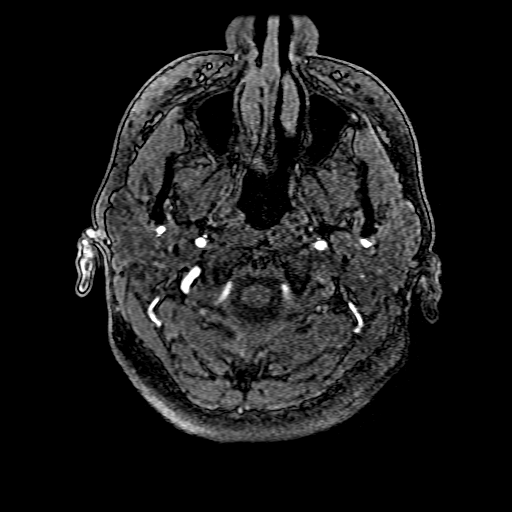
[im 44/136]
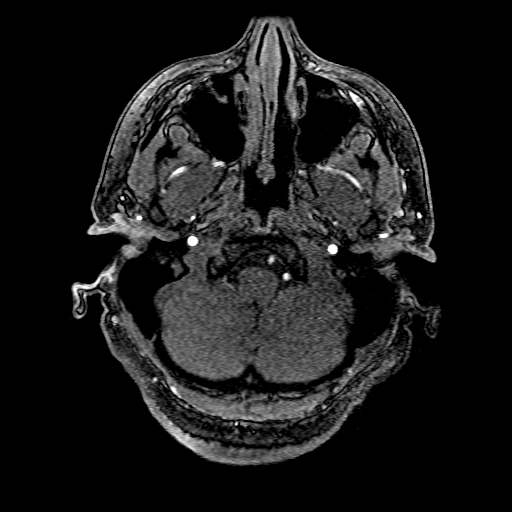
[im 61/136]
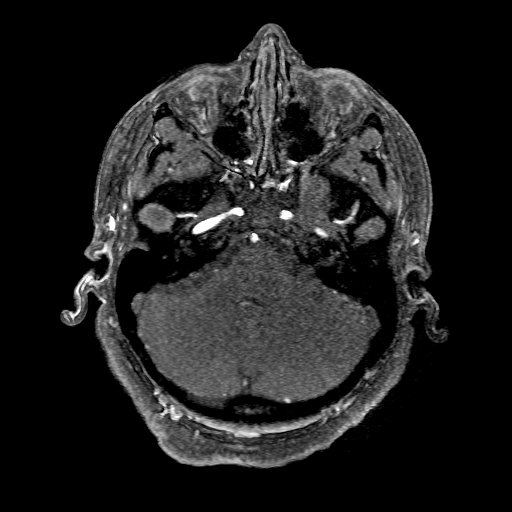
[im 69/136]
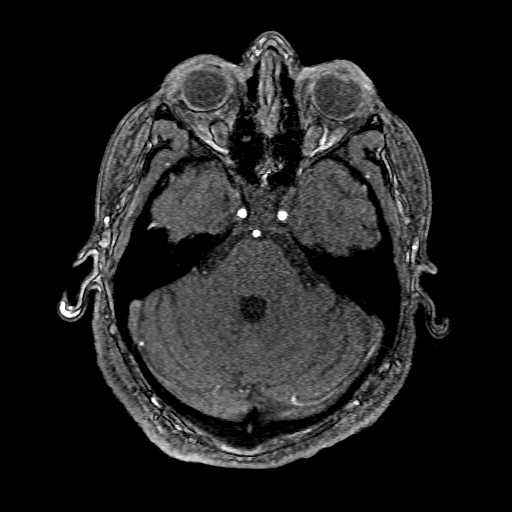
[im 78/136]
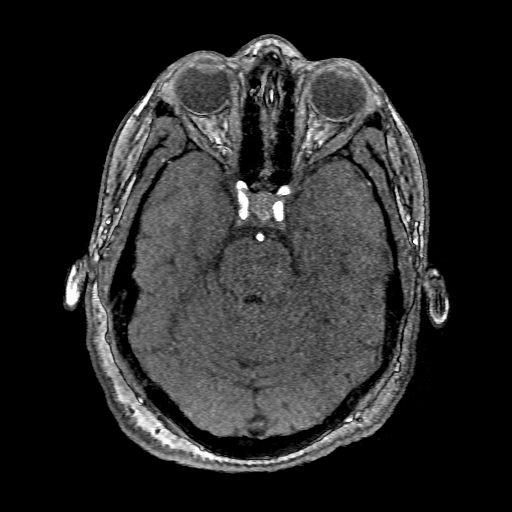
[im 95/136]
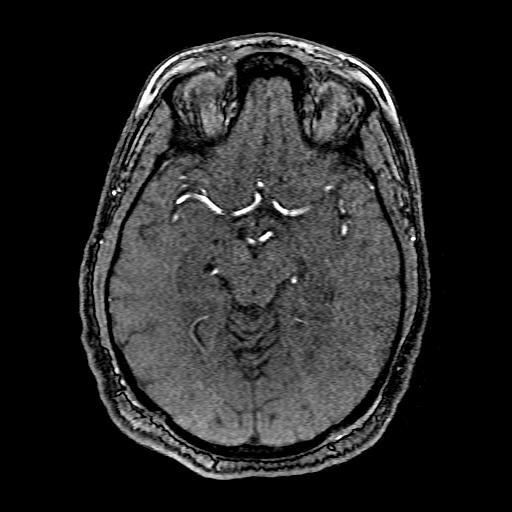
[im 113/136]
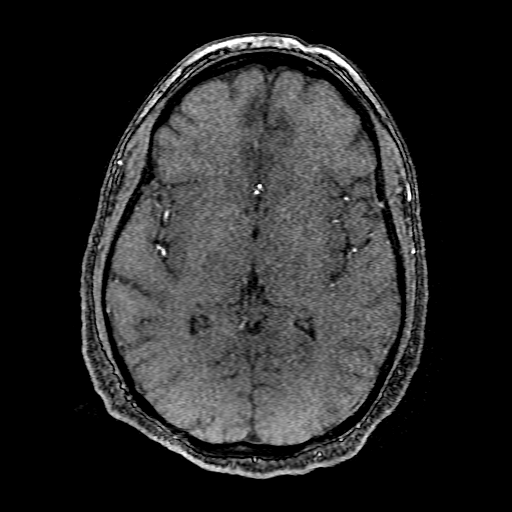
[im 115/136]
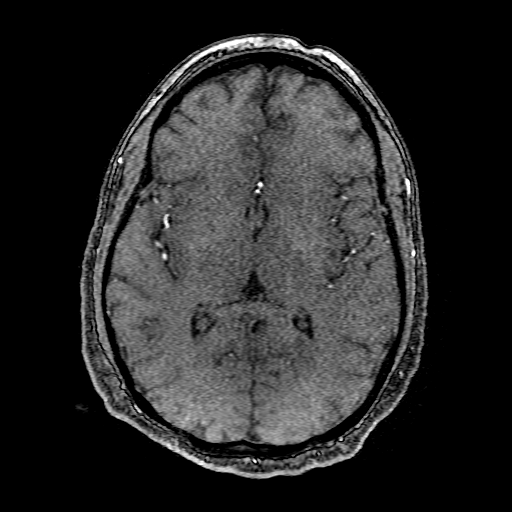
[im 130/136]
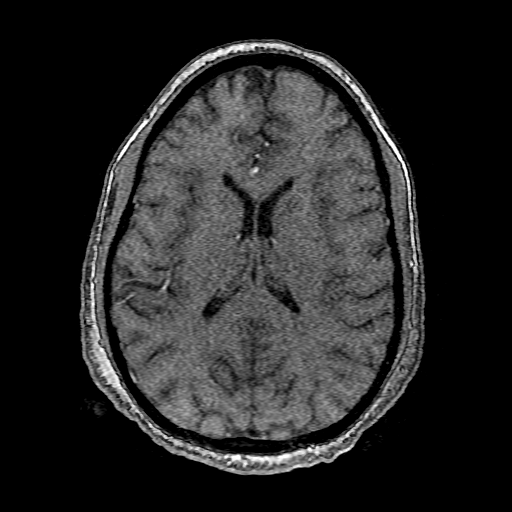

[18 of 48 positions shown; findings below may reference images not displayed]

FINDINGS: MRI HEAD FINDINGS

Brain: There is no evidence of an acute infarct, intracranial
hemorrhage, mass, midline shift, or extra-axial fluid collection.
The ventricles and sulci are normal. The brain is normal in signal.
A dilated perivascular space is incidentally noted inferiorly in the
right basal ganglia. There is also an incidental subcentimeter right
choroidal fissure cyst.

Vascular: Major intracranial vascular flow voids are preserved.

Skull and upper cervical spine: Unremarkable bone marrow signal.

Sinuses/Orbits: Unremarkable orbits. Paranasal sinuses and mastoid
air cells are clear.

Other: None.

MRA HEAD FINDINGS

The intracranial vertebral arteries are widely patent to the basilar
and codominant. Patent PICA and SCA origins are seen bilaterally.
The basilar artery is widely patent. There is a small left posterior
communicating artery. Both PCAs are patent without evidence of a
significant proximal stenosis.

The internal carotid arteries are widely patent from skull base to
carotid termini. ACAs and MCAs are patent without evidence of a
proximal branch occlusion or significant proximal stenosis. No
aneurysm is identified.

MRA NECK FINDINGS

There is mild motion artifact.

There is a standard 3 vessel aortic arch with widely patent arch
vessel origins. The common carotid and cervical internal carotid
arteries are patent and smooth bilaterally without evidence of a
significant stenosis or dissection. The vertebral arteries are
patent and codominant with antegrade flow and no evidence of a
significant stenosis or dissection.
IMPRESSION: Negative head MRI, head MRA, and neck MRA.

These results were called by telephone at the time of interpretation
on [DATE] at [DATE] to Dr. JAMAL, who verbally
acknowledged these results.

## 2021-05-19 IMAGING — MR MR MRA NECK W/O CM
1 series · 18 of 48 positions shown · non-contrast
Comparison: Head CT [DATE]

CLINICAL DATA: Neuro deficit, acute, stroke suspected. Left facial
droop, slight left arm drift, left ear pain, and decreased sensation
in the left face.



[Series 14: ax (id) · axial · 2.8mm · 0.47mm/px · z∈[-219,-35]mm · 18 of 140 slices shown]
[im 1/140]
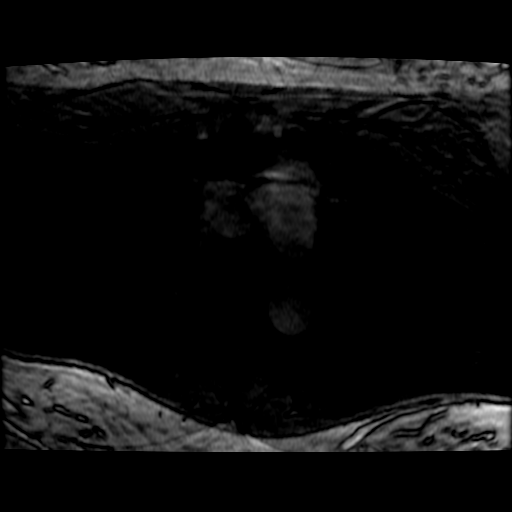
[im 3/140]
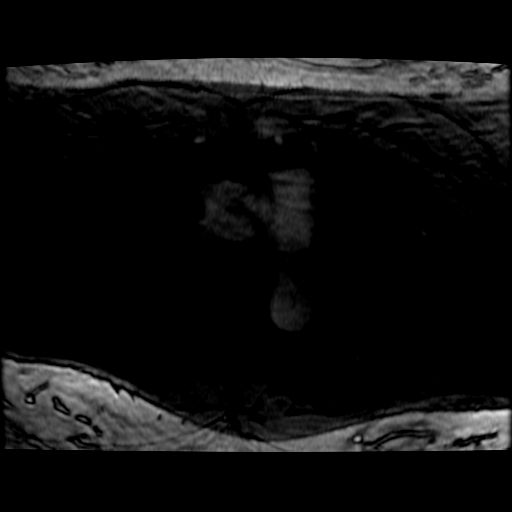
[im 6/140]
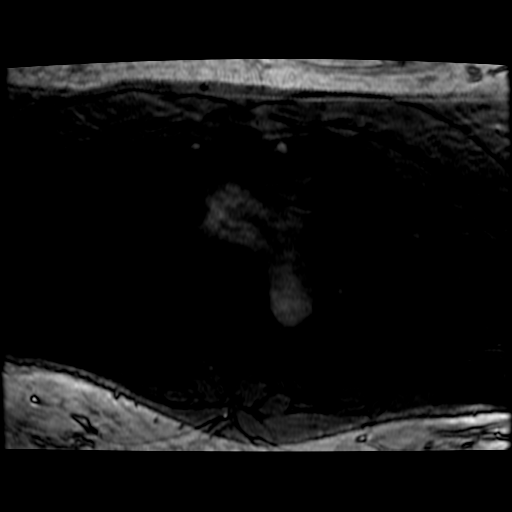
[im 9/140]
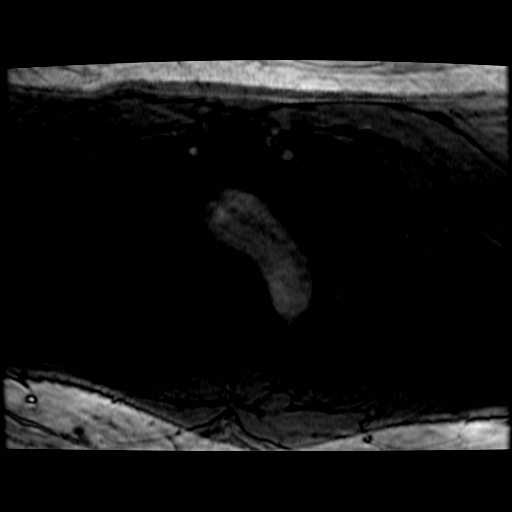
[im 12/140]
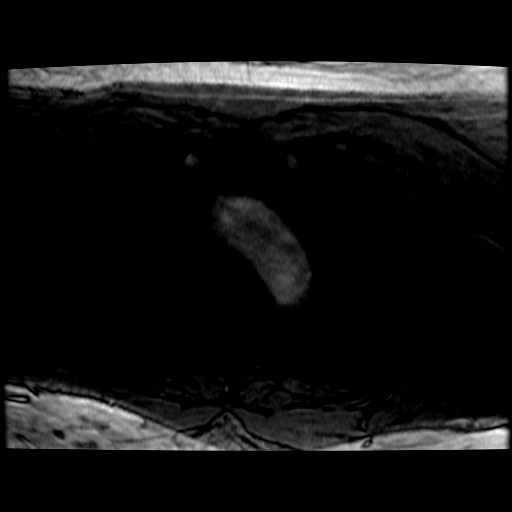
[im 15/140]
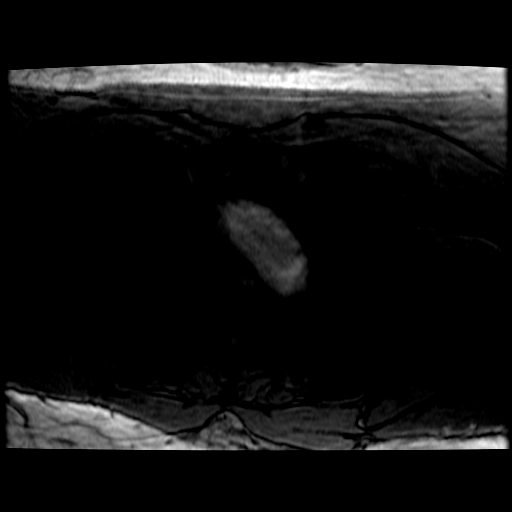
[im 18/140]
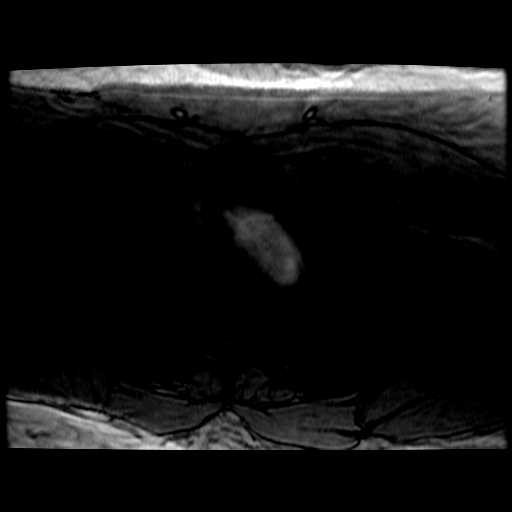
[im 21/140]
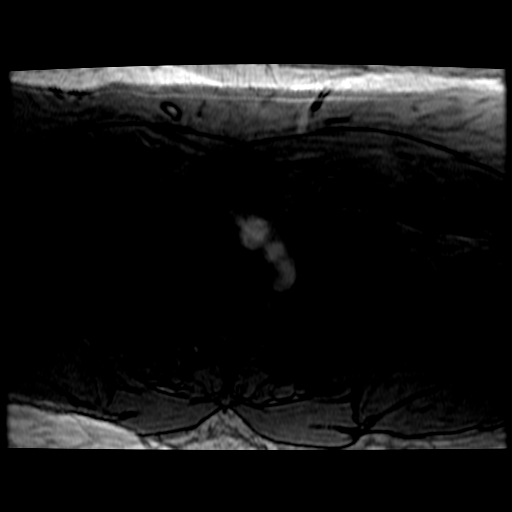
[im 24/140]
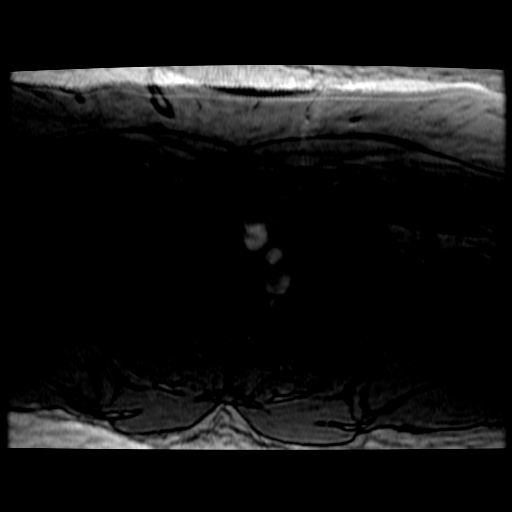
[im 27/140]
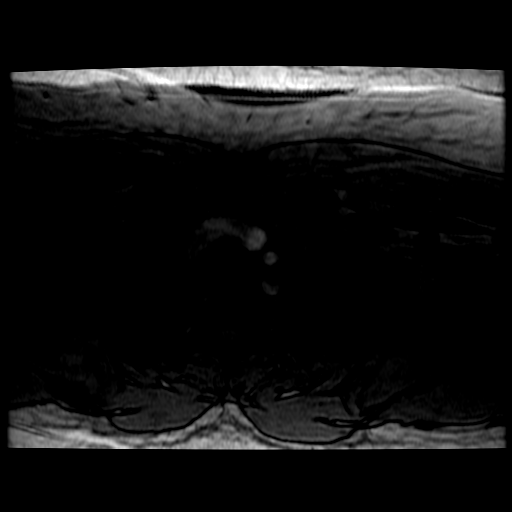
[im 45/140]
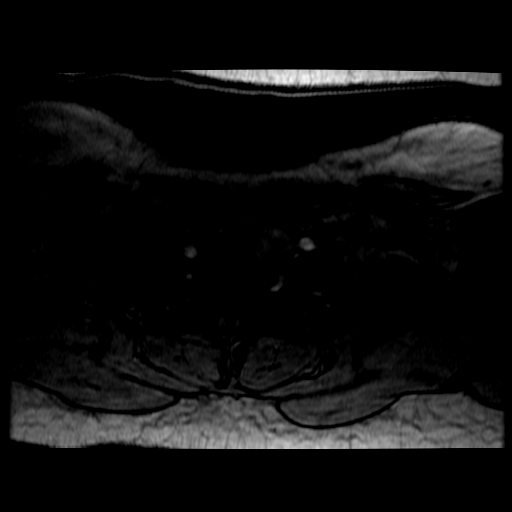
[im 63/140]
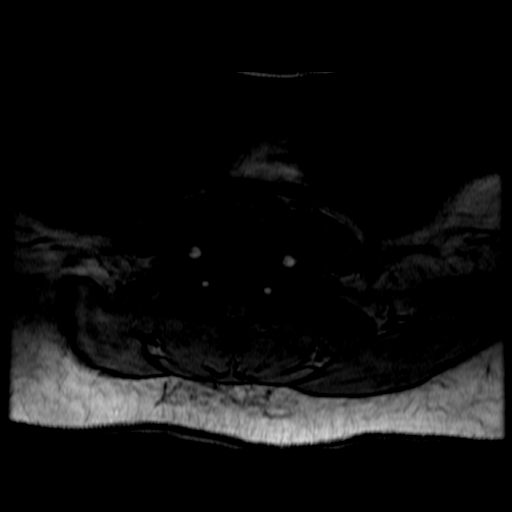
[im 71/140]
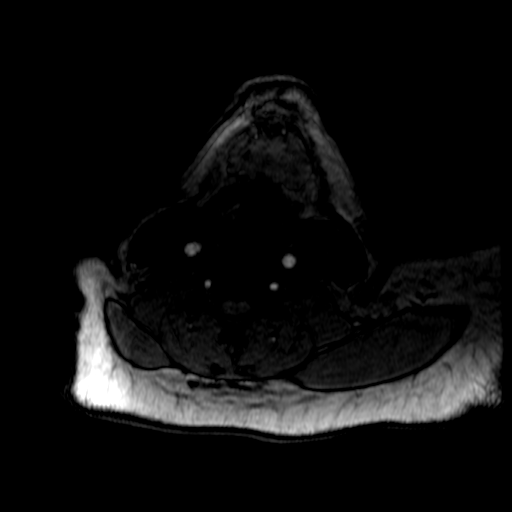
[im 80/140]
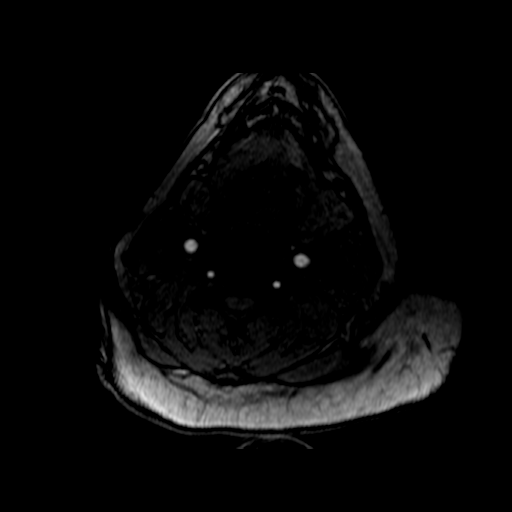
[im 98/140]
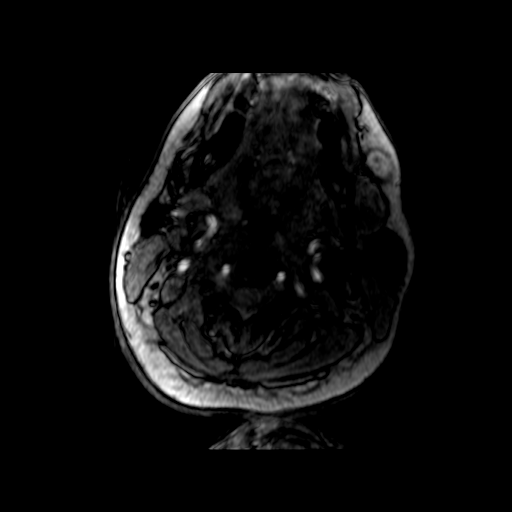
[im 116/140]
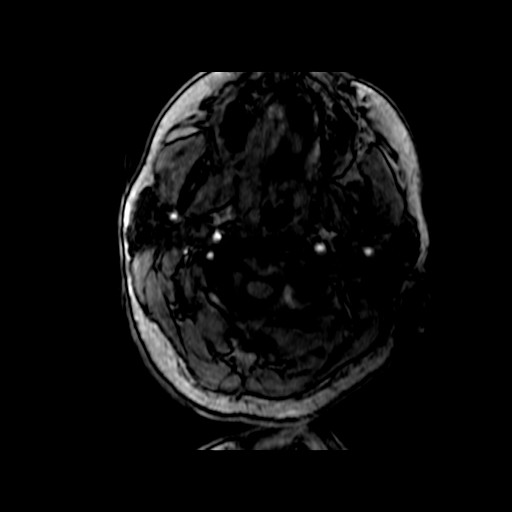
[im 119/140]
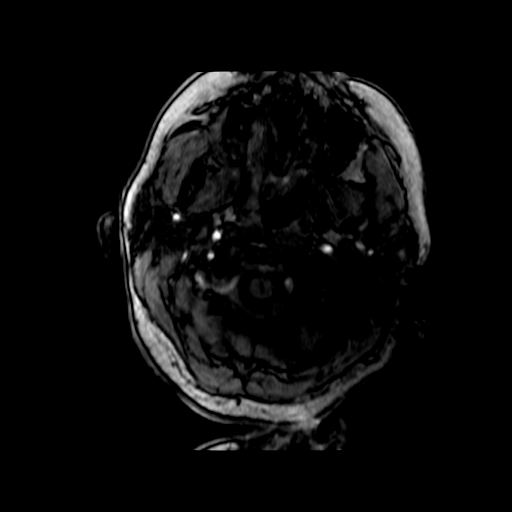
[im 134/140]
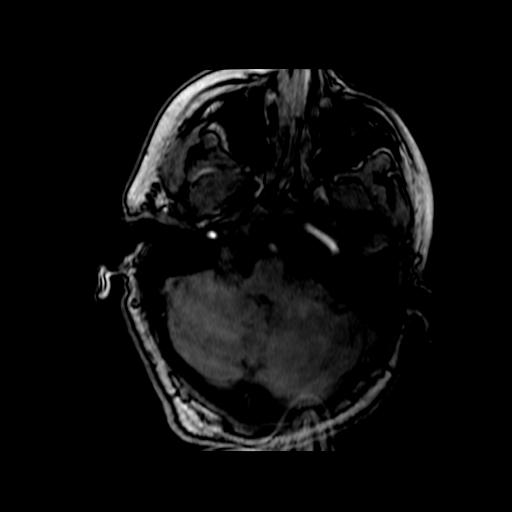

[18 of 48 positions shown; findings below may reference images not displayed]

FINDINGS: MRI HEAD FINDINGS

Brain: There is no evidence of an acute infarct, intracranial
hemorrhage, mass, midline shift, or extra-axial fluid collection.
The ventricles and sulci are normal. The brain is normal in signal.
A dilated perivascular space is incidentally noted inferiorly in the
right basal ganglia. There is also an incidental subcentimeter right
choroidal fissure cyst.

Vascular: Major intracranial vascular flow voids are preserved.

Skull and upper cervical spine: Unremarkable bone marrow signal.

Sinuses/Orbits: Unremarkable orbits. Paranasal sinuses and mastoid
air cells are clear.

Other: None.

MRA HEAD FINDINGS

The intracranial vertebral arteries are widely patent to the basilar
and codominant. Patent PICA and SCA origins are seen bilaterally.
The basilar artery is widely patent. There is a small left posterior
communicating artery. Both PCAs are patent without evidence of a
significant proximal stenosis.

The internal carotid arteries are widely patent from skull base to
carotid termini. ACAs and MCAs are patent without evidence of a
proximal branch occlusion or significant proximal stenosis. No
aneurysm is identified.

MRA NECK FINDINGS

There is mild motion artifact.

There is a standard 3 vessel aortic arch with widely patent arch
vessel origins. The common carotid and cervical internal carotid
arteries are patent and smooth bilaterally without evidence of a
significant stenosis or dissection. The vertebral arteries are
patent and codominant with antegrade flow and no evidence of a
significant stenosis or dissection.
IMPRESSION: Negative head MRI, head MRA, and neck MRA.

These results were called by telephone at the time of interpretation
on [DATE] at [DATE] to Dr. JAMAL, who verbally
acknowledged these results.

## 2021-05-19 MED ORDER — LABETALOL HCL 200 MG PO TABS
ORAL_TABLET | ORAL | Status: AC
  Administered 2021-05-19: 100 mg via ORAL
  Filled 2021-05-19: qty 1

## 2021-05-19 MED ORDER — SENNOSIDES-DOCUSATE SODIUM 8.6-50 MG PO TABS: 1.0000 | ORAL_TABLET | Freq: Every evening | ORAL | Status: DC | PRN

## 2021-05-19 MED ORDER — ACETAMINOPHEN 160 MG/5ML PO SOLN: 650.0000 mg | ORAL | Status: DC | PRN

## 2021-05-19 MED ORDER — FERROUS SULFATE 325 (65 FE) MG PO TABS
325.0000 mg | ORAL_TABLET | Freq: Every day | ORAL | Status: DC
  Administered 2021-05-20 – 2021-05-21 (×2): 325 mg via ORAL
  Filled 2021-05-19 (×2): qty 1

## 2021-05-19 MED ORDER — STROKE: EARLY STAGES OF RECOVERY BOOK: Freq: Once | Status: DC

## 2021-05-19 MED ORDER — SODIUM CHLORIDE 0.9% FLUSH
3.0000 mL | Freq: Once | INTRAVENOUS | Status: AC
  Administered 2021-05-19: 3 mL via INTRAVENOUS

## 2021-05-19 MED ORDER — PRENATAL MULTIVITAMIN CH
1.0000 | ORAL_TABLET | Freq: Every day | ORAL | Status: DC
  Administered 2021-05-20 – 2021-05-22 (×3): 1 via ORAL
  Filled 2021-05-19 (×3): qty 1

## 2021-05-19 MED ORDER — LACTATED RINGERS IV BOLUS
1000.0000 mL | Freq: Once | INTRAVENOUS | Status: AC
  Administered 2021-05-19: 1000 mL via INTRAVENOUS

## 2021-05-19 MED ORDER — ACETAMINOPHEN 325 MG PO TABS: 650.0000 mg | ORAL_TABLET | ORAL | Status: DC | PRN

## 2021-05-19 MED ORDER — LABETALOL HCL 100 MG PO TABS
100.0000 mg | ORAL_TABLET | Freq: Two times a day (BID) | ORAL | Status: DC
  Administered 2021-05-20 (×2): 100 mg via ORAL
  Filled 2021-05-19 (×3): qty 1

## 2021-05-19 MED ORDER — HEPARIN SODIUM (PORCINE) 10000 UNIT/ML IJ SOLN
10000.0000 [IU] | Freq: Two times a day (BID) | INTRAMUSCULAR | Status: AC
  Administered 2021-05-20 – 2021-05-21 (×4): 10000 [IU] via SUBCUTANEOUS
  Filled 2021-05-19 (×4): qty 1

## 2021-05-19 MED ORDER — ACETAMINOPHEN 650 MG RE SUPP: 650.0000 mg | RECTAL | Status: DC | PRN

## 2021-05-19 NOTE — ED Triage Notes (Addendum)
Pt reports L sided facial droop, L ear pain, loss of taste, and difficulty speaking since waking up at 8:30am.  LKW 2:30am today.    Pt with mild L arm drift and aphasia.  VAN +  [redacted] weeks pregnant.  Takes Heparin for DVT. Code Stroke VAN+ activated at triage.  Pt's chief complaint was "abnormal labs"- she denies abnormal labs.

## 2021-05-19 NOTE — ED Provider Notes (Signed)
[facility] EMERGENCY DEPARTMENT Provider Note   CSN: [id number] Arrival date & time: 05/19/21  1343     History Chief Complaint  Patient presents with   Code Stroke    Christine Wong is a 35 y.o. female.  The history is provided by the patient and medical records. No language interpreter was used.  Neurologic Problem This is a new problem. The current episode started 6 to 12 hours ago. The problem occurs constantly. The problem has not changed since onset.Pertinent negatives include no chest pain, no abdominal pain, no headaches and no shortness of breath. Nothing aggravates the symptoms. Nothing relieves the symptoms. She has tried nothing for the symptoms. The treatment provided no relief.      Past Medical History:  Diagnosis Date   Anxiety    DVT (deep venous thrombosis) ([place]) 08/2019   HPV (human papilloma virus) infection    Seasonal allergies    Vaginal Pap smear, abnormal     Patient Active Problem List   Diagnosis Date Noted   DVT (deep venous thrombosis) ([place]) 05/07/2021   Pulmonary embolism ([place]) 05/07/2021   Factor V Leiden ([place]) 05/07/2021    Past Surgical History:  Procedure Laterality Date   WISDOM TOOTH EXTRACTION  2014     OB History     Gravida  3   Para      Term      Preterm      AB  2   Living  0      SAB  2   IAB      Ectopic      Multiple      Live Births  0           Family History  Problem Relation Age of Onset   Hypertension Mother    Breast cancer Mother    Diabetes Father    Kidney failure Father    Heart disease Father    Hypertension Father    Pancreatic cancer Maternal Grandmother    Hypertension Maternal Grandmother    Breast cancer Paternal Grandmother    Ovarian cancer Paternal Grandmother    Colon cancer Maternal Uncle     Social History   Tobacco Use   Smoking status: Former    Types: Cigarettes    Quit date: 2009    Years since quitting: 13.9   Smokeless tobacco:  Never  Vaping Use   Vaping Use: Never used  Substance Use Topics   Alcohol use: Yes    Comment: socially   Drug use: Yes    Types: Marijuana    Comment: socially last use 9 months ago    Home Medications Prior to Admission medications   Medication Sig Start Date End Date Taking? Authorizing Provider  cetirizine (ZYRTEC) 10 MG tablet Take by mouth. Patient not taking: Reported on 05/04/2021    [provider]  enoxaparin (LOVENOX) 150 MG/ML injection Inject 150 mg into the skin daily.    [provider]  ferrous sulfate 325 (65 FE) MG tablet Take 325 mg by mouth daily with breakfast.    [provider]  fluticasone (FLONASE) 50 MCG/ACT nasal spray Place into the nose. 01/11/17 01/11/18  [provider]  heparin 10000 UNIT/ML injection heparin (porcine) 10,000 unit/mL injection solution  Take 1 mL twice a day by injection route.    [provider]  heparin 10000 UNIT/ML injection Inject 1 mL (10,000 Units total) into the skin 2 (two) times daily as  directed 05/13/21     Prenat-Fe Poly-Methfol-FA-DHA (VITAFOL ULTRA) 29-0.6-0.4-200 MG CAPS Take 1 tablet by mouth daily. 01/13/17   Constant, Peggy, MD    Allergies    Peanut-containing drug products and Penicillins  Review of Systems   Review of Systems  Constitutional:  Positive for chills and fatigue. Negative for diaphoresis and fever.  HENT:  Negative for congestion.   Eyes:  Negative for visual disturbance.  Respiratory:  Negative for cough, chest tightness, shortness of breath and wheezing.   Cardiovascular:  Negative for chest pain and palpitations.  Gastrointestinal:  Negative for abdominal pain, constipation, diarrhea, nausea and vomiting.  Genitourinary:  Negative for dysuria, flank pain and frequency.  Musculoskeletal:  Negative for back pain, neck pain and neck stiffness.  Skin:  Negative for rash and wound.  Neurological:  Positive for facial asymmetry, weakness and numbness.  Negative for dizziness, light-headedness and headaches.  Psychiatric/Behavioral:  Negative for agitation and confusion.   All other systems reviewed and are negative.  Physical Exam Updated Vital Signs BP (!) 147/111 (BP Location: Right Arm)   Pulse 78   Temp 98.3 F (36.8 C) (Oral)   Resp (!) 22   LMP 08/22/2020   SpO2 98%   Physical Exam Vitals and nursing note reviewed.  Constitutional:      General: She is not in acute distress.    Appearance: She is well-developed. She is not ill-appearing, toxic-appearing or diaphoretic.  HENT:     Head: Normocephalic and atraumatic.     Nose: No congestion or rhinorrhea.     Mouth/Throat:     Mouth: Mucous membranes are moist.     Pharynx: No oropharyngeal exudate or posterior oropharyngeal erythema.  Eyes:     Extraocular Movements: Extraocular movements intact.     Conjunctiva/sclera: Conjunctivae normal.     Pupils: Pupils are equal, round, and reactive to light.  Cardiovascular:     Rate and Rhythm: Normal rate and regular rhythm.     Heart sounds: No murmur heard. Pulmonary:     Effort: Pulmonary effort is normal. No respiratory distress.     Breath sounds: Normal breath sounds. No wheezing, rhonchi or rales.  Chest:     Chest wall: No tenderness.  Abdominal:     General: Abdomen is flat. There is distension (gravid).     Palpations: Abdomen is soft.     Tenderness: There is no abdominal tenderness. There is no right CVA tenderness, left CVA tenderness, guarding or rebound.  Musculoskeletal:        General: No swelling or tenderness.     Cervical back: Neck supple.  Skin:    General: Skin is warm and dry.     Capillary Refill: Capillary refill takes less than 2 seconds.     Findings: No erythema or rash.  Neurological:     Mental Status: She is alert.     Cranial Nerves: Cranial nerve deficit present.     Sensory: Sensory deficit present.     Motor: Weakness present.     Coordination: Coordination normal.   Psychiatric:        Mood and Affect: Mood normal.    ED Results / Procedures / Treatments   Labs (all labs ordered are listed, but only abnormal results are displayed) Labs Reviewed  APTT - Abnormal; Notable for the following components:      Result Value   aPTT 37 (*)    All other components within normal limits  DIFFERENTIAL - Abnormal; Notable  for the following components:   Neutro Abs 7.8 (*)    Abs Immature Granulocytes 0.08 (*)    All other components within normal limits  COMPREHENSIVE METABOLIC PANEL - Abnormal; Notable for the following components:   Sodium 134 (*)    Glucose, Bld 69 (*)    Albumin 2.9 (*)    Alkaline Phosphatase 239 (*)    All other components within normal limits  LIPID PANEL - Abnormal; Notable for the following components:   Cholesterol 316 (*)    Triglycerides 219 (*)    VLDL 44 (*)    LDL Cholesterol 161 (*)    All other components within normal limits  I-STAT CHEM 8, ED - Abnormal; Notable for the following components:   Sodium 134 (*)    BUN 5 (*)    All other components within normal limits  I-STAT BETA HCG BLOOD, ED ([facility], [facility], [facility] ONLY) - Abnormal; Notable for the following components:   I-stat hCG, quantitative >2,000.0 (*)    All other components within normal limits  I-STAT BETA HCG BLOOD, ED ([facility], [facility], [facility] ONLY) - Abnormal; Notable for the following components:   I-stat hCG, quantitative >2,000.0 (*)    All other components within normal limits  RESP PANEL BY RT-PCR (FLU A&B, COVID) ARPGX2  PROTIME-INR  CBC  URIC ACID  HEMOGLOBIN A1C  HIV ANTIBODY (ROUTINE TESTING W REFLEX)  CBG MONITORING, ED    EKG EKG Interpretation  Date/Time:  Tuesday May 19 2021 15:54:01 EST Ventricular Rate:  76 PR Interval:  126 QRS Duration: 70 QT Interval:  340 QTC Calculation: 382 R Axis:   36 Text Interpretation: Normal sinus rhythm Nonspecific T wave abnormality Abnormal ECG No prior ECG for comparison. No STEMI Confirmed by Antony Blackbird  [phone]) on 05/19/2021 4:57:57 PM  Radiology MR ANGIO HEAD WO CONTRAST  Result Date: 05/19/2021 CLINICAL DATA:  Neuro deficit, acute, stroke suspected. Left facial droop, slight left arm drift, left ear pain, and decreased sensation in the left face. EXAM: MRI HEAD WITHOUT CONTRAST MRA HEAD WITHOUT CONTRAST MRA NECK WITHOUT CONTRAST TECHNIQUE: Multiplanar, multiecho pulse sequences of the brain and surrounding structures were obtained without intravenous contrast. Angiographic images of the Circle of Willis were obtained using MRA technique without intravenous contrast. Angiographic images of the neck were obtained using MRA technique without intravenous contrast. Carotid stenosis measurements (when applicable) are obtained utilizing NASCET criteria, using the distal internal carotid diameter as the denominator. COMPARISON:  Head CT 05/19/2021 FINDINGS: MRI HEAD FINDINGS Brain: There is no evidence of an acute infarct, intracranial hemorrhage, mass, midline shift, or extra-axial fluid collection. The ventricles and sulci are normal. The brain is normal in signal. A dilated perivascular space is incidentally noted inferiorly in the right basal ganglia. There is also an incidental subcentimeter right choroidal fissure cyst. Vascular: Major intracranial vascular flow voids are preserved. Skull and upper cervical spine: Unremarkable bone marrow signal. Sinuses/Orbits: Unremarkable orbits. Paranasal sinuses and mastoid air cells are clear. Other: None. MRA HEAD FINDINGS The intracranial vertebral arteries are widely patent to the basilar and codominant. Patent PICA and SCA origins are seen bilaterally. The basilar artery is widely patent. There is a small left posterior communicating artery. Both PCAs are patent without evidence of a significant proximal stenosis. The internal carotid arteries are widely patent from skull base to carotid termini. ACAs and MCAs are patent without evidence of a proximal branch  occlusion or significant proximal stenosis. No aneurysm is identified. MRA NECK FINDINGS There is  mild motion artifact. There is a standard 3 vessel aortic arch with widely patent arch vessel origins. The common carotid and cervical internal carotid arteries are patent and smooth bilaterally without evidence of a significant stenosis or dissection. The vertebral arteries are patent and codominant with antegrade flow and no evidence of a significant stenosis or dissection. IMPRESSION: Negative head MRI, head MRA, and neck MRA. These results were called by telephone at the time of interpretation on 05/19/2021 at 6:37 pm to Dr. Marda Stalker, who verbally acknowledged these results. Electronically Signed   By: Logan Bores M.D.   On: 05/19/2021 18:39   MR ANGIO NECK WO CONTRAST  Result Date: 05/19/2021 CLINICAL DATA:  Neuro deficit, acute, stroke suspected. Left facial droop, slight left arm drift, left ear pain, and decreased sensation in the left face. EXAM: MRI HEAD WITHOUT CONTRAST MRA HEAD WITHOUT CONTRAST MRA NECK WITHOUT CONTRAST TECHNIQUE: Multiplanar, multiecho pulse sequences of the brain and surrounding structures were obtained without intravenous contrast. Angiographic images of the Circle of Willis were obtained using MRA technique without intravenous contrast. Angiographic images of the neck were obtained using MRA technique without intravenous contrast. Carotid stenosis measurements (when applicable) are obtained utilizing NASCET criteria, using the distal internal carotid diameter as the denominator. COMPARISON:  Head CT 05/19/2021 FINDINGS: MRI HEAD FINDINGS Brain: There is no evidence of an acute infarct, intracranial hemorrhage, mass, midline shift, or extra-axial fluid collection. The ventricles and sulci are normal. The brain is normal in signal. A dilated perivascular space is incidentally noted inferiorly in the right basal ganglia. There is also an incidental subcentimeter right  choroidal fissure cyst. Vascular: Major intracranial vascular flow voids are preserved. Skull and upper cervical spine: Unremarkable bone marrow signal. Sinuses/Orbits: Unremarkable orbits. Paranasal sinuses and mastoid air cells are clear. Other: None. MRA HEAD FINDINGS The intracranial vertebral arteries are widely patent to the basilar and codominant. Patent PICA and SCA origins are seen bilaterally. The basilar artery is widely patent. There is a small left posterior communicating artery. Both PCAs are patent without evidence of a significant proximal stenosis. The internal carotid arteries are widely patent from skull base to carotid termini. ACAs and MCAs are patent without evidence of a proximal branch occlusion or significant proximal stenosis. No aneurysm is identified. MRA NECK FINDINGS There is mild motion artifact. There is a standard 3 vessel aortic arch with widely patent arch vessel origins. The common carotid and cervical internal carotid arteries are patent and smooth bilaterally without evidence of a significant stenosis or dissection. The vertebral arteries are patent and codominant with antegrade flow and no evidence of a significant stenosis or dissection. IMPRESSION: Negative head MRI, head MRA, and neck MRA. These results were called by telephone at the time of interpretation on 05/19/2021 at 6:37 pm to Dr. Marda Stalker, who verbally acknowledged these results. Electronically Signed   By: Logan Bores M.D.   On: 05/19/2021 18:39   MR BRAIN WO CONTRAST  Result Date: 05/19/2021 CLINICAL DATA:  Neuro deficit, acute, stroke suspected. Left facial droop, slight left arm drift, left ear pain, and decreased sensation in the left face. EXAM: MRI HEAD WITHOUT CONTRAST MRA HEAD WITHOUT CONTRAST MRA NECK WITHOUT CONTRAST TECHNIQUE: Multiplanar, multiecho pulse sequences of the brain and surrounding structures were obtained without intravenous contrast. Angiographic images of the Circle of  Willis were obtained using MRA technique without intravenous contrast. Angiographic images of the neck were obtained using MRA technique without intravenous contrast. Carotid stenosis measurements (when applicable) are  obtained utilizing NASCET criteria, using the distal internal carotid diameter as the denominator. COMPARISON:  Head CT 05/19/2021 FINDINGS: MRI HEAD FINDINGS Brain: There is no evidence of an acute infarct, intracranial hemorrhage, mass, midline shift, or extra-axial fluid collection. The ventricles and sulci are normal. The brain is normal in signal. A dilated perivascular space is incidentally noted inferiorly in the right basal ganglia. There is also an incidental subcentimeter right choroidal fissure cyst. Vascular: Major intracranial vascular flow voids are preserved. Skull and upper cervical spine: Unremarkable bone marrow signal. Sinuses/Orbits: Unremarkable orbits. Paranasal sinuses and mastoid air cells are clear. Other: None. MRA HEAD FINDINGS The intracranial vertebral arteries are widely patent to the basilar and codominant. Patent PICA and SCA origins are seen bilaterally. The basilar artery is widely patent. There is a small left posterior communicating artery. Both PCAs are patent without evidence of a significant proximal stenosis. The internal carotid arteries are widely patent from skull base to carotid termini. ACAs and MCAs are patent without evidence of a proximal branch occlusion or significant proximal stenosis. No aneurysm is identified. MRA NECK FINDINGS There is mild motion artifact. There is a standard 3 vessel aortic arch with widely patent arch vessel origins. The common carotid and cervical internal carotid arteries are patent and smooth bilaterally without evidence of a significant stenosis or dissection. The vertebral arteries are patent and codominant with antegrade flow and no evidence of a significant stenosis or dissection. IMPRESSION: Negative head MRI, head MRA,  and neck MRA. These results were called by telephone at the time of interpretation on 05/19/2021 at 6:37 pm to Dr. Marda Stalker, who verbally acknowledged these results. Electronically Signed   By: Logan Bores M.D.   On: 05/19/2021 18:39   CT HEAD CODE STROKE WO CONTRAST  Result Date: 05/19/2021 CLINICAL DATA:  Code stroke. Neuro deficit, acute, stroke suspected. Additional history provided: Weakness. Additional history obtained from [place] facial droop, slight left arm drift, left ear pain, decreased sensation left face. EXAM: CT HEAD WITHOUT CONTRAST TECHNIQUE: Contiguous axial images were obtained from the base of the skull through the vertex without intravenous contrast. COMPARISON:  No pertinent prior exams available for comparison. FINDINGS: Brain: Cerebral volume is normal. Subcentimeter circumscribed hypodensity within the inferior right basal ganglia most consistent with a prominent perivascular space. Suspected small choroid fissure cyst on the right. There is no acute intracranial hemorrhage. No demarcated cortical infarct. No extra-axial fluid collection. No evidence of an intracranial mass. No midline shift. Vascular: No hyperdense vessel. Skull: Normal. Negative for fracture or focal lesion. Sinuses/Orbits: Visualized orbits show no acute finding. Trace mucosal thickening within the right maxillary sinus at the imaged levels. ASPECTS [facility] Stroke Program Early CT Score) - Ganglionic level infarction (caudate, lentiform nuclei, internal capsule, insula, M1-M3 cortex): 7 - Supraganglionic infarction (M4-M6 cortex): 3 Total score (0-10 with 10 being normal): 10 These results were communicated to Dr. Cheral Marker At 4:16 pmon 11/29/2022by text page via the [facility] messaging system. IMPRESSION: No evidence of acute intracranial abnormality. ASPECTS is 10. Electronically Signed   By: Kellie Simmering D.O.   On: 05/19/2021 16:18    Procedures Procedures   Medications  Ordered in ED Medications   stroke: mapping our early stages of recovery book (has no administration in time range)  acetaminophen (TYLENOL) tablet 650 mg (has no administration in time range)    Or  acetaminophen (TYLENOL) 160 MG/5ML solution 650 mg (has no administration in time range)    Or  acetaminophen (TYLENOL) suppository 650 mg (has no administration in time range)  senna-docusate (Senokot-S) tablet 1 tablet (has no administration in time range)  heparin injection 10,000 Units (has no administration in time range)  ferrous sulfate tablet 325 mg (has no administration in time range)  prenatal multivitamin tablet 1 tablet (has no administration in time range)  labetalol (NORMODYNE) tablet 100 mg (100 mg Oral Given 05/19/21 2235)  sodium chloride flush (NS) 0.9 % injection 3 mL (3 mLs Intravenous Given 05/19/21 1853)  lactated ringers bolus 1,000 mL (0 mLs Intravenous Stopped 05/19/21 2204)    ED Course  I have reviewed the triage vital signs and the nursing notes.  Pertinent labs & imaging results that were available during my care of the patient were reviewed by me and considered in my medical decision making (see chart for details).    MDM Rules/Calculators/A&P                           Tamieka Reeb is a 35 y.o. female with a past medical history significant for factor V Leiden with previous pulmonary embolism and DVT on anticoagulation with heparin who is also [redacted] weeks pregnant who presents as a code stroke.  According to patient, she was last normal at 2 AM last night.  She started having symptoms this morning with difficulty speaking, left-sided facial droop, and left arm and left leg numbness and some weakness.  Patient was evaluated when she was going to have a OB exam today and was sent in for code stroke.  Patient reports the symptoms are waxing and waning and denies any history of previous stroke.  She does not have any known history of PFO.  Otherwise she reports he is  doing well recently.  Patient is still feeling her baby moving and denies any loss of fluid or bleeding from the vagina.  No recent trauma.  No headache or neck pain.  No chest pain palpitations shortness of breath, nausea, or vomiting.  Patient had a CT scan given the concern for code stroke.  CT was reassuring however neurology quickly saw the patient and feel she needs MRI and MRA.  Imaging with MRI and MRI did not show acute stroke however given the high risk for clotting and the symptoms the patient is still having, neurology recommends admission.  OB/GYN and rapid OB team were evaluating the patient and feels she is appropriate for medicine admission and they will follow along with.  They checked her cervix and it was closed.  They do say she is having some contractions likely related to the anxiety of being here.  They requested LR administration and they will follow-up  Spoke with neurology who feels that patient does need admission for further TIA for stroke work-up.  The MRI did not show evidence of acute stroke and the CT scan also did not show bleed or acute stroke.  Given the patient's waxing waning but somewhat persistent symptoms and her history of factor V Leiden, previous clots, they do feel she is admission for echo to rule out PFO and further monitoring.  OB/GYN reports that they will be able to follow and suspect that the small amount of contraction she was having is due to stress.  They ordered LR and will continue to monitor but feel she is appropriate for medicine admit for this TIA versus stroke.  Medicine will be called for admission  Final Clinical Impression(s) / ED Diagnoses Final  diagnoses:  Slurred speech  Numbness   Clinical Impression: 1. Slurred speech   2. Numbness     Disposition: Admit  This note was prepared with assistance of Dragon voice recognition software. Occasional wrong-word or sound-a-like substitutions may have occurred due to the inherent  limitations of voice recognition software.      Nikoletta Varma, Canary Brim, MD 05/20/21 [phone]

## 2021-05-19 NOTE — Progress Notes (Signed)
35 year old G 3 P 0020 at 57 w 4 day presented from office.   OB history significant for Factor 5 Leiden mutation on therapeutic anticoagulation (LOVENOX now on Heparin) and planned for IOL at 39 weeks. 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  Advised to go to ER for evaluation - MRI and CT negative for stroke.  Admitted for observation because of neurologic symptoms.  Rapid Response Nurse has been monitoring baby and patient.  Rapid response nurse reports that since arrival to ER facial droop slightly improved.  Past Medical History:  Diagnosis Date   Anxiety    DVT (deep venous thrombosis) (HCC) 08/2019   HPV (human papilloma virus) infection    Seasonal allergies    Vaginal Pap smear, abnormal    Past Surgical History:  Procedure Laterality Date   WISDOM TOOTH EXTRACTION  2014   Peanut-containing drug products and Penicillins Prior to Admission medications   Medication Sig Start Date End Date Taking? Authorizing Provider  docusate sodium (COLACE) 100 MG capsule Take 100 mg by mouth daily as needed for mild constipation.   Yes [provider]  ferrous sulfate 325 (65 FE) MG tablet Take 325 mg by mouth daily with breakfast.   Yes [provider]  heparin 03546 UNIT/ML injection Inject 1 mL (10,000 Units total) into the skin 2 (two) times daily as directed 05/13/21  Yes   Prenat-Fe Poly-Methfol-FA-DHA (VITAFOL ULTRA) 29-0.6-0.4-200 MG CAPS Take 1 tablet by mouth daily. 01/13/17  Yes Constant, Peggy, MD  cetirizine (ZYRTEC) 10 MG tablet Take by mouth. Patient not taking: Reported on 05/04/2021    [provider]  enoxaparin (LOVENOX) 150 MG/ML injection Inject 150 mg into the skin daily. Patient not taking: Reported on 05/19/2021    [provider]  fluticasone (FLONASE) 50 MCG/ACT nasal spray Place into the nose. Patient not taking: Reported on  05/19/2021 01/11/17 01/11/18  [provider]  heparin 56812 UNIT/ML injection Inject 10,000 Units into the skin in the morning and at bedtime. Patient not taking: Reported on 05/19/2021    [provider]   Social History   Socioeconomic History   Marital status: Single    Spouse name: Not on file   Number of children: Not on file   Years of education: Not on file   Highest education level: Not on file  Occupational History   Not on file  Tobacco Use   Smoking status: Former    Types: Cigarettes    Quit date: 2009    Years since quitting: 13.9   Smokeless tobacco: Never  Vaping Use   Vaping Use: Never used  Substance and Sexual Activity   Alcohol use: Yes    Comment: socially   Drug use: Yes    Types: Marijuana    Comment: socially last use 9 months ago   Sexual activity: Yes    Partners: Female    Birth control/protection: None  Other Topics Concern   Not on file  Social History Narrative   Not on file   Social Determinants of Health   Financial Resource Strain: Not on file  Food Insecurity: Not on file  Transportation Needs: Not on file  Physical Activity: Not on file  Stress: Not on file  Social Connections: Not on file   Family History  Problem Relation Age of Onset   Hypertension Mother    Breast  cancer Mother    Diabetes Father    Kidney failure Father    Heart disease Father    Hypertension Father    Pancreatic cancer Maternal Grandmother    Hypertension Maternal Grandmother    Breast cancer Paternal Grandmother    Ovarian cancer Paternal Grandmother    Colon cancer Maternal Uncle    Impression; IUP at 1 w 4 days Factor 5 Leiden Mutation History of DVT and PE Has been fully anticoagulated recently transitioned to heparin 97948 units bid   New onset neurologic symptoms Appreciate medicine care of patient  Will follow with you  Concern will be need to hold anticoagulation for induction of labor. Would delay induction until  neurologic symptoms are fully evaluated.

## 2021-05-19 NOTE — ED Provider Notes (Signed)
Emergency Medicine Provider Triage Evaluation Note  Christine Wong , a 35 y.o. female  was evaluated in triage.  Pt complains of strokelike symptoms, last known normal at approximately 2:30 AM this morning.  Patient has left-sided facial droop, slight left arm drift, left ear pain, and decreased sensation of the left face.  Of note patient is [redacted] weeks pregnant.  She is currently on heparin for DVT and PE related to factor V Leiden.  Review of Systems  Positive: Facial droop, left arm drift, left ear pain Negative: Chest pain, shortness of breath  Physical Exam  BP (!) 147/111 (BP Location: Right Arm)   Pulse 78   Temp 98.3 F (36.8 C) (Oral)   Resp (!) 22   LMP 08/22/2020   SpO2 98%  Gen:   Awake, no distress   Resp:  Normal effort  MSK:   Moves extremities without difficulty  Other:  4/5 left grip strength, 5/5 strength in all her extremities, left-sided facial droop, no speech abnormalities, slight left-sided arm drift  Medical Decision Making  Medically screening exam initiated at 4:00 PM.  Appropriate orders placed.  Renu Asby was informed that the remainder of the evaluation will be completed by another provider, this initial triage assessment does not replace that evaluation, and the importance of remaining in the ED until their evaluation is complete.  Code stroke activated, will send for CT without contrast, will contact rapid OB after scan   Su Monks, PA-C 05/19/21 1602    Gerhard Munch, MD 05/23/21 1421

## 2021-05-19 NOTE — Consult Note (Signed)
Neurology Consultation  Reason for Consult: Left facial droop, slurred speech  Referring Physician: Dr. Sherry Ruffing  CC: Left mouth droop, slurred speech, left-sided weakness  History is obtained from: Patient, chart review  HPI: Christine Wong is a 35 y.o. female that is [redacted] weeks pregnant with a medical history significant for anxiety, remote DVT and PE related to factor V Leiden on heparin who presented to the ED 11/29 for evaluation of left facial droop, left ear pain, loss of taste, and slurred speech since waking up this morning at 08:00. Patient states that she was last at her baseline at 02:00 this morning and woke up at 07:00 to lay on the couch since she could not get comfortable but is unsure if her symptoms were present at this time. She then got up again around 08:00 and noticed that the water she was drinking was falling out of the left side of her mouth and she noticed that her left face was drooping. She states that she went for a stress test today and was noted to be having contractions but was sent to the ED with ongoing concerns for left facial droop, slurred speech, and left upper extremity weakness. In triage, patient was noticed to have a slight left arm drift and a Code Stroke was activated for further neurology evaluation.   LKW: 02:00 TNK given?: no, patient's last known well is 02:00 IR Thrombectomy? No, patient's presentation is not concerning for LVO.  Modified Rankin Scale: 0-Completely asymptomatic and back to baseline post- stroke  ROS: A complete ROS was performed and is negative except as noted in the HPI.   Past Medical History:  Diagnosis Date   Anxiety    DVT (deep venous thrombosis) ([place]) 08/2019   HPV (human papilloma virus) infection    Seasonal allergies    Vaginal Pap smear, abnormal    Past Surgical History:  Procedure Laterality Date   WISDOM TOOTH EXTRACTION  2014   Family History  Problem Relation Age of Onset   Hypertension Mother    Breast  cancer Mother    Diabetes Father    Kidney failure Father    Heart disease Father    Hypertension Father    Pancreatic cancer Maternal Grandmother    Hypertension Maternal Grandmother    Breast cancer Paternal Grandmother    Ovarian cancer Paternal Grandmother    Colon cancer Maternal Uncle    Social History:   reports that she quit smoking about 13 years ago. Her smoking use included cigarettes. She has never used smokeless tobacco. She reports current alcohol use. She reports current drug use. Drug: Marijuana.  Medications  Current Facility-Administered Medications:    sodium chloride flush (NS) 0.9 % injection 3 mL, 3 mL, Intravenous, Once, Tegeler, Gwenyth Allegra, MD  Current Outpatient Medications:    cetirizine (ZYRTEC) 10 MG tablet, Take by mouth. (Patient not taking: Reported on 05/04/2021), Disp: , Rfl:    enoxaparin (LOVENOX) 150 MG/ML injection, Inject 150 mg into the skin daily., Disp: , Rfl:    ferrous sulfate 325 (65 FE) MG tablet, Take 325 mg by mouth daily with breakfast., Disp: , Rfl:    fluticasone (FLONASE) 50 MCG/ACT nasal spray, Place into the nose., Disp: , Rfl:    heparin 10000 UNIT/ML injection, heparin (porcine) 10,000 unit/mL injection solution  Take 1 mL twice a day by injection route., Disp: , Rfl:    heparin 10000 UNIT/ML injection, Inject 1 mL (10,000 Units total) into the skin 2 (two) times daily  as directed, Disp: 60 mL, Rfl: 1   Prenat-Fe Poly-Methfol-FA-DHA (VITAFOL ULTRA) 29-0.6-0.4-200 MG CAPS, Take 1 tablet by mouth daily., Disp: 30 capsule, Rfl: 12  Exam: Current vital signs: BP (!) 147/111 (BP Location: Right Arm)   Pulse 78   Temp 98.3 F (36.8 C) (Oral)   Resp (!) 22   LMP 08/22/2020   SpO2 98%  Vital signs in last 24 hours: Temp:  [98.3 F (36.8 C)] 98.3 F (36.8 C) (11/29 1551) Pulse Rate:  [78] 78 (11/29 1551) Resp:  [22] 22 (11/29 1551) BP: (147)/(111) 147/111 (11/29 1551) SpO2:  [98 %] 98 % (11/29 1551)  GENERAL: Awake, alert,  in no acute distress Psych: Affect appropriate for situation, patient is calm and cooperative with examination Head: Normocephalic and atraumatic, without obvious abnormality EENT: Normal conjunctivae, dry mucous membranes, no OP obstruction LUNGS: Normal respiratory effort. Non-labored breathing on room air CV: Regular rate and rhythm on telemetry ABDOMEN: Rounded, patient is [redacted] weeks pregnant, non-tender Extremities: warm, well perfused, without obvious deformity  NEURO:  Mental Status: Awake, alert, and oriented to person, place, time, and situation. She is able to provide a clear and coherent history of present illness. Speech/Language: speech is slightly dysarthric from baseline.   Naming, repetition, fluency, and comprehension intact without aphasia. No neglect is noted. Cranial Nerves:  II: PERRL 3 mm/brisk. Visual fields full.  III, IV, VI: EOMI without ptosis, nystagmus, or gaze preference.   V: Sensation is intact to light touch and symmetrical to face.  VII: Face is asymmetric with mild left mouth droop  VIII: Hearing is intact to voice IX, X: Palate elevation is symmetric. Phonation normal.  XI: Normal sternocleidomastoid and trapezius muscle strength XII: Tongue protrudes midline without fasciculations.   Motor: 5/5 strength present throughout without vertical drift.  Tone is normal. Bulk is normal.  Sensation: Patient reports a "heavy" sensation to light touch on the left lower extremity.  Coordination: FTN intact bilaterally. HKS intact on the right; some dysmetria of the LLE. DTRs: 3+and symmetric biceps and brachioradialis  Gait: Stable with waddling quality due to pregnancy  NIHSS: 1a Level of Conscious.: 0 1b LOC Questions: 0 1c LOC Commands: 0 2 Best Gaze: 0 3 Visual: 0 4 Facial Palsy: 1 5a Motor Arm - left: 0 5b Motor Arm - Right: 0 6a Motor Leg - Left: 0 6b Motor Leg - Right: 0 7 Limb Ataxia: 1 8 Sensory: 1 9 Best Language: 0 10 Dysarthria: 1 11  Extinct. and Inatten.: 0 TOTAL: 4  Labs I have reviewed labs in epic and the results pertinent to this consultation are:  CBC    Component Value Date/Time   WBC 10.3 05/04/2021 1035   RBC 4.25 05/04/2021 1035   HGB 13.9 05/19/2021 1610   HGB 12.3 05/04/2021 1035   HCT 41.0 05/19/2021 1610   PLT 324 05/04/2021 1035   MCV 86.1 05/04/2021 1035   MCH 28.9 05/04/2021 1035   MCHC 33.6 05/04/2021 1035   RDW 14.9 05/04/2021 1035   LYMPHSABS 1.4 05/04/2021 1035   MONOABS 1.2 (H) 05/04/2021 1035   EOSABS 0.3 05/04/2021 1035   BASOSABS 0.1 05/04/2021 1035   CMP     Component Value Date/Time   NA 134 (L) 05/19/2021 1610   K 4.5 05/19/2021 1610   CL 105 05/19/2021 1610   CO2 21 (L) 05/04/2021 1035   GLUCOSE 70 05/19/2021 1610   BUN 5 (L) 05/19/2021 1610   CREATININE 0.60 05/19/2021 1610   CREATININE  0.67 05/04/2021 1035   CALCIUM 9.7 05/04/2021 1035   PROT 7.0 05/04/2021 1035   ALBUMIN 2.8 (L) 05/04/2021 1035   AST 13 (L) 05/04/2021 1035   ALT 9 05/04/2021 1035   ALKPHOS 213 (H) 05/04/2021 1035   BILITOT 0.2 (L) 05/04/2021 1035   GFRNONAA >60 05/04/2021 1035   Lipid Panel  No results found for: CHOL, TRIG, HDL, CHOLHDL, VLDL, LDLCALC, LDLDIRECT  Lab Results  Component Value Date   HGBA1C 5.2 01/13/2017   Imaging I have reviewed the images obtained:  CT-scan of the brain 11/29: No evidence of acute intracranial abnormality. ASPECTS is 10.  Assessment: 35 y.o. female with history as above who presented to the ED 11/29 for evaluation of left facial droop, slurred speech, left upper extremity weakness, and left lower extremity sensory deficit. Patient's CTH was obtained on arrival without evidence of acute intracranial abnormality. Due to patient's last known well being 02:00 and patient's pregnancy, patient was taken to MRI for further imaging.  - Examination revealed patient with left facial droop, dysarthria, and left-sided sensory deficit without evidence of left upper  extremity weakness on neurology examination. Her initial NIHSS is a 4. - Patient is not a candidate for TNK due to presenting outside of the thrombolytic therapy window and due to patient being pregnant. Patient is not a candidate for IR as her presentation is not consistent with an LVO.  - Patient's stroke risk factors include history of DVT and PE with a history of factor V Leiden.  - Presentation is concerning for an acute stroke, pending MRI for further evaluation. Paradoxical embolization via PFO is on the DDx.  - Other DDx: Differential diagnoses include Migraine Accompaniment (complicated migraine without headache pain) versus conversion disorder versus functional disorder versus MRI negative stroke. Due to patient's risk factors recommend hospitalist admission for further stroke work up, specifically echocardiogram to evaluate for PFO.  Recommendations: - MRI brain without contrast - MRA head and neck without contrast - Will expand stroke work up if MRI reveals acute ischemia.   Addendum: -MRI brain: Negative head MRI, head MRA, and neck MRA. -DDx now includes TIA.   Additional Recommendations:  - Echocardiogram - Hemoglobin A1c and Lipid panel  - Frequent neuro checks - Prophylactic therapy- on heparin per OB - Risk factor modification - Telemetry monitoring - PT consult, OT consult, Speech consult - Stroke team to follow  Pt seen by NP/Neuro and later by MD.  Anibal Henderson, AGAC-NP [facility] Pager: [phone]  I have seen and examined the patient. I have reviewed the assessment and recommendations and made amendations as needed. 35 y.o. female with history as above who presented to the ED 11/29 for evaluation of left facial droop, slurred speech, left upper extremity weakness, and left lower extremity sensory deficit. Patient's CTH was obtained on arrival without evidence of acute intracranial abnormality. Due to patient's last known well being 02:00 and  patient's pregnancy, patient was taken to MRI for further imaging. Examination reveals patient with left facial droop, dysarthria, and left-sided sensory deficit without evidence of left upper extremity weakness on neurology examination. Her initial NIHSS is a 4. DDx as above. Will need stroke work up.  Electronically signed: Dr. Kerney Elbe

## 2021-05-19 NOTE — H&P (Addendum)
Family Medicine Teaching [facility] Admission History and Physical Service Pager: [phone]  Patient name: Christine Wong Medical record number: [id number] Date of birth: 1986-05-03 Age: 35 y.o. Gender: female  Primary Care Provider: Pcp, No Consultants: Neurology, OB/GYN Code Status: FULL CODE  Preferred Emergency Contact:  Mother: Adisynn Suleiman [phone] Daughter: Jennye Moccasin: [phone]  Chief Complaint: left sided weakness and slurred speech  Assessment and Plan: Christine Wong is a 35 y.o. G72P0020 female [redacted]w[redacted]d presenting with left sided facial droop, dysarthria and left-sided weakness concerning for TIA vs CVA. 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   Acute left-sided weakness concerning for TIA vs. Stroke in the setting of Factor V Leiden vs. Complicated migraine Last known normal verified normal 11/29 2AM. Noticed left facial droop, slurred speech, LUE weakness, LLE sensory deficit when she woke up this AM ~0800. She also endorses headache for the last week with blurry vision today. Denies similarity to her usual migraines. She presented to the ED and Code Stroke was activated and Neurology consulted. CT head showed no evidence of acute intracranial abnormality, ASPECTS 10. MRI head, MRA head, MRA neck negative for acute stroke. CBC with differential and CMP appear unremarkable at this time. Neurology recommended Echo for evaluation of PFO given patient is high risk for cerebrovascular event with history of prior PE/DVT and known blood-clotting disorder. Pt had Echo on 05/12/21 noting LVEF 60-65% without regional wall motion abnormalities. Trivial MV regurgitation. No bubble study performed.  She has been on anticoagulation for her history of PE/DVT (08/2019) and factor V leiden. She transitioned from Lovenox to Heparin on 05/15/21 due to getting closer to due date. Imaging  thus far reassuring against CVA but will admit patient for observation and further work up with repeat Echo. Other differential includes complicated migraine as patient does present with left-sided headache and has not taken any medications for it. -admitted to FPTS, attending Dr. Lum Babe, Med Tele -mNIHSS, VS, pulse ox q2x12 hours then q4h -OOB w/ assistance -Continuous cardiac monitoring with pulse ox -PT/OT eval and treat -SLP eval and treat -Echo with bubble study -Neurology following, appreciate recs -Tylenol 650 mg q4h PRN mild pain or headache -Continue subcutaneous heparin   Hx DVT, PE  Heterozygous Factor V Leiden (actively on Heparin) DVT US LE performed on 05/04/21 for lower extremity pain/edema showed no evidence of DVT. Continues to have 1+ pitting edema b/l. Previously on Xarelto outside of pregnancy and then transitioned to therapeutic dose of Lovenox when finding out she was pregnant. Actively transitioned from Lovenox to Heparin on 05/15/21. Reports compliance with medications. No SOB. Wells score for PE is 1.5 for history of prior DVT/PE. Wells score for DVT 1- moderate risk.  - Continue Heparin BID -Could consider repeat imaging if concern for clot or embolism but stable symptoms and presentation from her most recent imaging which was reassuring so will defer  Active pregnancy at [redacted]w[redacted]d, G3P0020  Concern for developing gHTN Cell free DNA indicated low risk for trisomy 21, 18, and 13. She is advanced maternal age (>35). FHT @2200  showed baseline 120, moderate variability, accelerations 15x15, no decels, contractions q2.5-64min. Category I. Contractions not felt by patient and have further spaced out s/p 1L LR bolus. Denies active bleeding or discharge, regularly feeling fetal movement. Not requiring intervention at this time.  Upon admission, patient had elevated blood pressures of 147/111 and 132/108.  4 hours later notably had elevated blood  pressures in the 130s-140s/90s-100s.   LFTs unremarkable, platelets WNL. Concern for gHTN and progression to preE given setting of stroke-like symptoms and undifferentiated headache vs migraine in addition to blurry vision. Discussed with OB, recommended uric acid and starting on labetalol and continuing to monitor. -Labetalol 100 mg twice daily - f/u uric acid -Tylenol 650 mg every 4 hours as needed -Continue ferrous sulfate, prenatal multivitamin -Senokot nightly as needed -Continue to monitor blood pressures -Fetal monitoring per OB, appreciate recs  Hyperlipidemia Lipid panel notable for LDL 161, triglycerides 219, cholesterol 216, HDL 111.  Given setting of pregnancy, statin would not be appropriate at this time.  She does also not meet age criteria.  Consider outpatient management with lifestyle intervention and observation.  Anxiety: chronic, stable Not medically managed at this time.  Mood appropriate at this time.  Not requiring active management currently.  FEN/GI: regular diet (passed bedside swallow) Prophylaxis: Subcutaneous Heparin 10,000U BID  Disposition: Med Tele  History of Present Illness:  Christine Wong is a 35 y.o. female presenting with face drooping and L extremity weakness.  This morning when she was drinking water it started dripping down her mouth, around 8-9AM. She was then on the phone and noticed that she couldn't talk well. She looked at herself in the mirror and realized that her smile was off. She recalls last being normal around 2AM. Also recalls waking up and walking to the couch around 7AM and everything was fine. The strength in her hands seem equal. She also noticed her legs have been more swollen and hands and legs are red. Denies any rash/itching.  She also endorses a L-sided headache that started on 1 week ago "it was a stagnant pain but it started to get worse on Friday-Saturday". It was in her ear, "but also in my jaw". She denies any medication use. When the headache is present, she  rates it to be 10-15/10. "When it is stagnant, it is about a 5/10". She recalls having some elevated blood pressures at her OB visit.  She was told to check her pressures at home but does not due to not having a monitor.  Denies any current blurry vision, double vision, auras.  Notes some blurred vision in her left eye earlier but appears to have resolved.  Denies chest pain, palpitations, abdominal pain or right upper quadrant pain, shortness of breath.  She continues to endorse mild headache.  She started Heparin on Saturday around 2AM. She was recently transitioned from Lovenox as she approaches her due date. She had an appointment with OB for a stress test and was advised to go to the ED for further evaluation.  She does recall that she is GBS positive and will need antibiotics prior to delivery.  She was still feeling baby move throughout the day but has not felt contractions.  Baby's name is [employer].   Review Of Systems: Per HPI with the following additions:   Review of Systems  Constitutional:  Negative for fatigue.  HENT:  Negative for congestion and rhinorrhea.   Eyes:  Negative for visual disturbance.  Respiratory:  Negative for shortness of breath.   Cardiovascular:  Negative for chest pain and palpitations.  Gastrointestinal:  Negative for abdominal pain.  Genitourinary:  Negative for vaginal bleeding and vaginal discharge.  Neurological:  Positive for facial asymmetry, speech difficulty and numbness. Negative for weakness.    Patient Active Problem List   Diagnosis Date Noted   DVT (deep venous thrombosis) ([place]) 05/07/2021  Pulmonary embolism ([place]) 05/07/2021   Factor V Leiden ([place]) 05/07/2021    Past Medical History: Past Medical History:  Diagnosis Date   Anxiety    DVT (deep venous thrombosis) ([place]) 08/2019   HPV (human papilloma virus) infection    Seasonal allergies    Vaginal Pap smear, abnormal     Past Surgical History: Past Surgical History:   Procedure Laterality Date   WISDOM TOOTH EXTRACTION  2014    Social History: Social History   Tobacco Use   Smoking status: Former    Types: Cigarettes    Quit date: 2009    Years since quitting: 13.9   Smokeless tobacco: Never  Vaping Use   Vaping Use: Never used  Substance Use Topics   Alcohol use: Yes    Comment: socially   Drug use: Yes    Types: Marijuana    Comment: socially last use 9 months ago   Additional social history: Former smoker. No illicit drugs. Stopped drinking "a while ago".  Please also refer to relevant sections of EMR.  Family History: Family History  Problem Relation Age of Onset   Hypertension Mother    Breast cancer Mother    Diabetes Father    Kidney failure Father    Heart disease Father    Hypertension Father    Pancreatic cancer Maternal Grandmother    Hypertension Maternal Grandmother    Breast cancer Paternal Grandmother    Ovarian cancer Paternal Grandmother    Colon cancer Maternal Uncle     Allergies and Medications: Allergies  Allergen Reactions   Peanut-Containing Drug Products Anaphylaxis   Penicillins Other (See Comments)    Unknown reaction   No current facility-administered medications on file prior to encounter.   Current Outpatient Medications on File Prior to Encounter  Medication Sig Dispense Refill   cetirizine (ZYRTEC) 10 MG tablet Take by mouth. (Patient not taking: Reported on 05/04/2021)     enoxaparin (LOVENOX) 150 MG/ML injection Inject 150 mg into the skin daily.     ferrous sulfate 325 (65 FE) MG tablet Take 325 mg by mouth daily with breakfast.     fluticasone (FLONASE) 50 MCG/ACT nasal spray Place into the nose.     heparin 10000 UNIT/ML injection heparin (porcine) 10,000 unit/mL injection solution  Take 1 mL twice a day by injection route.     heparin 10000 UNIT/ML injection Inject 1 mL (10,000 Units total) into the skin 2 (two) times daily as directed 60 mL 1   Prenat-Fe Poly-Methfol-FA-DHA (VITAFOL  ULTRA) 29-0.6-0.4-200 MG CAPS Take 1 tablet by mouth daily. 30 capsule 12    Objective: BP 124/62   Pulse 90   Temp 98.3 F (36.8 C) (Oral)   Resp 18   Ht 5\' 2"  (1.575 m)   Wt 103.4 kg   LMP 08/22/2020   SpO2 99%   BMI 41.70 kg/m  Exam: General: WDWN, NAD, pregnant female Eyes: PERRL, EOMI Face: left sided facial droop ENTM: moist mucous membranes, ears not evaluated Neck: normal ROM Cardiovascular: RRR, no murmurs auscultated Respiratory: CTAB, no increased work of breathing Gastrointestinal: bowel sounds present throughout Extremities: 1+ pitting edema of BLE Derm: no rashes or lesions appreciated Neuro: CN 2-12 intact. Left sided facial droop, BUEs strength 5/5 and sensation intact, RLE strength 5/5, LLE strength 4/5, decreased sensation to left-side of face, arm, and leg compared to right, speech is comprehensible but with slight dysarthria. Normal finger-nose-finger. Normal heel to shin on the right but more delayed on  the left. Gait not evaluated.  Psych: normal behavior and mood  Labs and Imaging: CBC BMET  Recent Labs  Lab 05/19/21 1558 05/19/21 1610  WBC 10.3  --   HGB 13.8 13.9  HCT 41.7 41.0  PLT 362  --    Recent Labs  Lab 05/19/21 1558 05/19/21 1610  NA 134* 134*  K 4.6 4.5  CL 103 105  CO2 23  --   BUN 6 5*  CREATININE 0.66 0.60  GLUCOSE 69* 70  CALCIUM 10.0  --      EKG: NSR, no ST elevation, borderline shortened PR interval, normal QRS interval  MR ANGIO HEAD WO CONTRAST  Result Date: 05/19/2021 CLINICAL DATA:  Neuro deficit, acute, stroke suspected. Left facial droop, slight left arm drift, left ear pain, and decreased sensation in the left face. EXAM: MRI HEAD WITHOUT CONTRAST MRA HEAD WITHOUT CONTRAST MRA NECK WITHOUT CONTRAST TECHNIQUE: Multiplanar, multiecho pulse sequences of the brain and surrounding structures were obtained without intravenous contrast. Angiographic images of the Circle of Willis were obtained using MRA technique  without intravenous contrast. Angiographic images of the neck were obtained using MRA technique without intravenous contrast. Carotid stenosis measurements (when applicable) are obtained utilizing NASCET criteria, using the distal internal carotid diameter as the denominator. COMPARISON:  Head CT 05/19/2021 FINDINGS: MRI HEAD FINDINGS Brain: There is no evidence of an acute infarct, intracranial hemorrhage, mass, midline shift, or extra-axial fluid collection. The ventricles and sulci are normal. The brain is normal in signal. A dilated perivascular space is incidentally noted inferiorly in the right basal ganglia. There is also an incidental subcentimeter right choroidal fissure cyst. Vascular: Major intracranial vascular flow voids are preserved. Skull and upper cervical spine: Unremarkable bone marrow signal. Sinuses/Orbits: Unremarkable orbits. Paranasal sinuses and mastoid air cells are clear. Other: None. MRA HEAD FINDINGS The intracranial vertebral arteries are widely patent to the basilar and codominant. Patent PICA and SCA origins are seen bilaterally. The basilar artery is widely patent. There is a small left posterior communicating artery. Both PCAs are patent without evidence of a significant proximal stenosis. The internal carotid arteries are widely patent from skull base to carotid termini. ACAs and MCAs are patent without evidence of a proximal branch occlusion or significant proximal stenosis. No aneurysm is identified. MRA NECK FINDINGS There is mild motion artifact. There is a standard 3 vessel aortic arch with widely patent arch vessel origins. The common carotid and cervical internal carotid arteries are patent and smooth bilaterally without evidence of a significant stenosis or dissection. The vertebral arteries are patent and codominant with antegrade flow and no evidence of a significant stenosis or dissection. IMPRESSION: Negative head MRI, head MRA, and neck MRA. These results were called  by telephone at the time of interpretation on 05/19/2021 at 6:37 pm to Dr. Marda Stalker, who verbally acknowledged these results. Electronically Signed   By: Logan Bores M.D.   On: 05/19/2021 18:39   MR ANGIO NECK WO CONTRAST  Result Date: 05/19/2021 CLINICAL DATA:  Neuro deficit, acute, stroke suspected. Left facial droop, slight left arm drift, left ear pain, and decreased sensation in the left face. EXAM: MRI HEAD WITHOUT CONTRAST MRA HEAD WITHOUT CONTRAST MRA NECK WITHOUT CONTRAST TECHNIQUE: Multiplanar, multiecho pulse sequences of the brain and surrounding structures were obtained without intravenous contrast. Angiographic images of the Circle of Willis were obtained using MRA technique without intravenous contrast. Angiographic images of the neck were obtained using MRA technique without intravenous contrast. Carotid stenosis measurements (  when applicable) are obtained utilizing NASCET criteria, using the distal internal carotid diameter as the denominator. COMPARISON:  Head CT 05/19/2021 FINDINGS: MRI HEAD FINDINGS Brain: There is no evidence of an acute infarct, intracranial hemorrhage, mass, midline shift, or extra-axial fluid collection. The ventricles and sulci are normal. The brain is normal in signal. A dilated perivascular space is incidentally noted inferiorly in the right basal ganglia. There is also an incidental subcentimeter right choroidal fissure cyst. Vascular: Major intracranial vascular flow voids are preserved. Skull and upper cervical spine: Unremarkable bone marrow signal. Sinuses/Orbits: Unremarkable orbits. Paranasal sinuses and mastoid air cells are clear. Other: None. MRA HEAD FINDINGS The intracranial vertebral arteries are widely patent to the basilar and codominant. Patent PICA and SCA origins are seen bilaterally. The basilar artery is widely patent. There is a small left posterior communicating artery. Both PCAs are patent without evidence of a significant proximal  stenosis. The internal carotid arteries are widely patent from skull base to carotid termini. ACAs and MCAs are patent without evidence of a proximal branch occlusion or significant proximal stenosis. No aneurysm is identified. MRA NECK FINDINGS There is mild motion artifact. There is a standard 3 vessel aortic arch with widely patent arch vessel origins. The common carotid and cervical internal carotid arteries are patent and smooth bilaterally without evidence of a significant stenosis or dissection. The vertebral arteries are patent and codominant with antegrade flow and no evidence of a significant stenosis or dissection. IMPRESSION: Negative head MRI, head MRA, and neck MRA. These results were called by telephone at the time of interpretation on 05/19/2021 at 6:37 pm to Dr. Marda Stalker, who verbally acknowledged these results. Electronically Signed   By: Logan Bores M.D.   On: 05/19/2021 18:39   MR BRAIN WO CONTRAST  Result Date: 05/19/2021 CLINICAL DATA:  Neuro deficit, acute, stroke suspected. Left facial droop, slight left arm drift, left ear pain, and decreased sensation in the left face. EXAM: MRI HEAD WITHOUT CONTRAST MRA HEAD WITHOUT CONTRAST MRA NECK WITHOUT CONTRAST TECHNIQUE: Multiplanar, multiecho pulse sequences of the brain and surrounding structures were obtained without intravenous contrast. Angiographic images of the Circle of Willis were obtained using MRA technique without intravenous contrast. Angiographic images of the neck were obtained using MRA technique without intravenous contrast. Carotid stenosis measurements (when applicable) are obtained utilizing NASCET criteria, using the distal internal carotid diameter as the denominator. COMPARISON:  Head CT 05/19/2021 FINDINGS: MRI HEAD FINDINGS Brain: There is no evidence of an acute infarct, intracranial hemorrhage, mass, midline shift, or extra-axial fluid collection. The ventricles and sulci are normal. The brain is normal in  signal. A dilated perivascular space is incidentally noted inferiorly in the right basal ganglia. There is also an incidental subcentimeter right choroidal fissure cyst. Vascular: Major intracranial vascular flow voids are preserved. Skull and upper cervical spine: Unremarkable bone marrow signal. Sinuses/Orbits: Unremarkable orbits. Paranasal sinuses and mastoid air cells are clear. Other: None. MRA HEAD FINDINGS The intracranial vertebral arteries are widely patent to the basilar and codominant. Patent PICA and SCA origins are seen bilaterally. The basilar artery is widely patent. There is a small left posterior communicating artery. Both PCAs are patent without evidence of a significant proximal stenosis. The internal carotid arteries are widely patent from skull base to carotid termini. ACAs and MCAs are patent without evidence of a proximal branch occlusion or significant proximal stenosis. No aneurysm is identified. MRA NECK FINDINGS There is mild motion artifact. There is a standard 3 vessel aortic  arch with widely patent arch vessel origins. The common carotid and cervical internal carotid arteries are patent and smooth bilaterally without evidence of a significant stenosis or dissection. The vertebral arteries are patent and codominant with antegrade flow and no evidence of a significant stenosis or dissection. IMPRESSION: Negative head MRI, head MRA, and neck MRA. These results were called by telephone at the time of interpretation on 05/19/2021 at 6:37 pm to Dr. Marda Stalker, who verbally acknowledged these results. Electronically Signed   By: Logan Bores M.D.   On: 05/19/2021 18:39   CT HEAD CODE STROKE WO CONTRAST  Result Date: 05/19/2021 CLINICAL DATA:  Code stroke. Neuro deficit, acute, stroke suspected. Additional history provided: Weakness. Additional history obtained from [place] facial droop, slight left arm drift, left ear pain, decreased sensation left  face. EXAM: CT HEAD WITHOUT CONTRAST TECHNIQUE: Contiguous axial images were obtained from the base of the skull through the vertex without intravenous contrast. COMPARISON:  No pertinent prior exams available for comparison. FINDINGS: Brain: Cerebral volume is normal. Subcentimeter circumscribed hypodensity within the inferior right basal ganglia most consistent with a prominent perivascular space. Suspected small choroid fissure cyst on the right. There is no acute intracranial hemorrhage. No demarcated cortical infarct. No extra-axial fluid collection. No evidence of an intracranial mass. No midline shift. Vascular: No hyperdense vessel. Skull: Normal. Negative for fracture or focal lesion. Sinuses/Orbits: Visualized orbits show no acute finding. Trace mucosal thickening within the right maxillary sinus at the imaged levels. ASPECTS [facility] Stroke Program Early CT Score) - Ganglionic level infarction (caudate, lentiform nuclei, internal capsule, insula, M1-M3 cortex): 7 - Supraganglionic infarction (M4-M6 cortex): 3 Total score (0-10 with 10 being normal): 10 These results were communicated to Dr. Cheral Marker At 4:16 pmon 11/29/2022by text page via the [facility] messaging system. IMPRESSION: No evidence of acute intracranial abnormality. ASPECTS is 10. Electronically Signed   By: Kellie Simmering D.O.   On: 05/19/2021 16:18     Wells Guiles, DO 05/19/2021, 7:37 PM PGY-1, [place] Intern pager: [phone], text pages welcome  FPTS Upper-Level Resident Addendum   I have independently interviewed and examined the patient. I have discussed the above with the original author and agree with their documentation. My edits for correction/addition/clarification are included where appropriate. Please see also any attending notes.   Sharion Settler, DO PGY-2, [facility] 05/20/2021 2:04 AM  FPTS Service pager: [phone] (text pages welcome through [facility])

## 2021-05-19 NOTE — Progress Notes (Signed)
G3P0 at 38 4/7 weeks reports to [facility] for Code Stroke.  Was seen at her OB today for her normal appointment and was told to come to ER for Code Stroke symptoms.  Patient has left side weakness,some left facial droop.  Complains of "back pain that feels like period cramping".  5/10.  No c/o leaking or bleeding.  SVE in office was closed.  Monitors applied for NST per Dr Vincente Poli.

## 2021-05-19 NOTE — Code Documentation (Signed)
Stroke Response Nurse Documentation Code Documentation  Tiffanie Blassingame is a 35 y.o. female arriving to [facility] ED via Private Vehicle on 05/19/2021 with past medical hx of DVT, anxiety. Currently [redacted] weeks pregnant. On eliquis prior to pregnancy. Now on lovenox. Code stroke was activated by ED.   Patient from home where she was LKW at 0230. She woke up later around 0830 and noted she was unable to drink water. The water was spilling out of her mouth. She went to her OB appointment as scheduled and mentioned this to her provider who told her to go to the ED.  Stroke team at the bedside on patient arrival. Labs drawn and patient cleared for CT by EDP in triage. Patient to CT with team. NIHSS 3, see documentation for details and code stroke times. Patient with left facial droop, left limb ataxia, and left decreased sensation on exam. The following imaging was completed: CT, MRI. Patient is not a candidate for IV Thrombolytic due to out of the window. Patient is not a candidate for IR due to no LVO. MRI negative.  Bedside handoff with ED RN.    Ferman Hamming Stroke Response RN

## 2021-05-20 ENCOUNTER — Observation Stay (HOSPITAL_COMMUNITY): Payer: Medicaid Other

## 2021-05-20 ENCOUNTER — Inpatient Hospital Stay (HOSPITAL_COMMUNITY): Payer: Medicaid Other

## 2021-05-20 DIAGNOSIS — E785 Hyperlipidemia, unspecified: Secondary | ICD-10-CM | POA: Diagnosis present

## 2021-05-20 DIAGNOSIS — M7989 Other specified soft tissue disorders: Secondary | ICD-10-CM | POA: Diagnosis not present

## 2021-05-20 DIAGNOSIS — R4781 Slurred speech: Secondary | ICD-10-CM | POA: Diagnosis not present

## 2021-05-20 DIAGNOSIS — R200 Anesthesia of skin: Secondary | ICD-10-CM | POA: Diagnosis not present

## 2021-05-20 DIAGNOSIS — R2990 Unspecified symptoms and signs involving the nervous system: Secondary | ICD-10-CM | POA: Diagnosis not present

## 2021-05-20 DIAGNOSIS — Z20822 Contact with and (suspected) exposure to covid-19: Secondary | ICD-10-CM | POA: Diagnosis present

## 2021-05-20 DIAGNOSIS — D6851 Activated protein C resistance: Secondary | ICD-10-CM | POA: Diagnosis present

## 2021-05-20 DIAGNOSIS — Z3A38 38 weeks gestation of pregnancy: Secondary | ICD-10-CM

## 2021-05-20 DIAGNOSIS — O9912 Other diseases of the blood and blood-forming organs and certain disorders involving the immune mechanism complicating childbirth: Secondary | ICD-10-CM | POA: Diagnosis present

## 2021-05-20 DIAGNOSIS — O99354 Diseases of the nervous system complicating childbirth: Secondary | ICD-10-CM | POA: Diagnosis present

## 2021-05-20 DIAGNOSIS — Z86711 Personal history of pulmonary embolism: Secondary | ICD-10-CM | POA: Diagnosis not present

## 2021-05-20 DIAGNOSIS — O99214 Obesity complicating childbirth: Secondary | ICD-10-CM | POA: Diagnosis present

## 2021-05-20 DIAGNOSIS — O134 Gestational [pregnancy-induced] hypertension without significant proteinuria, complicating childbirth: Secondary | ICD-10-CM | POA: Diagnosis present

## 2021-05-20 DIAGNOSIS — O99284 Endocrine, nutritional and metabolic diseases complicating childbirth: Secondary | ICD-10-CM | POA: Diagnosis present

## 2021-05-20 DIAGNOSIS — Z86718 Personal history of other venous thrombosis and embolism: Secondary | ICD-10-CM | POA: Diagnosis not present

## 2021-05-20 DIAGNOSIS — Z87891 Personal history of nicotine dependence: Secondary | ICD-10-CM | POA: Diagnosis not present

## 2021-05-20 DIAGNOSIS — G510 Bell's palsy: Secondary | ICD-10-CM

## 2021-05-20 DIAGNOSIS — I639 Cerebral infarction, unspecified: Secondary | ICD-10-CM | POA: Diagnosis present

## 2021-05-20 DIAGNOSIS — G43909 Migraine, unspecified, not intractable, without status migrainosus: Secondary | ICD-10-CM | POA: Diagnosis present

## 2021-05-20 DIAGNOSIS — O99824 Streptococcus B carrier state complicating childbirth: Secondary | ICD-10-CM | POA: Diagnosis present

## 2021-05-20 DIAGNOSIS — O9942 Diseases of the circulatory system complicating childbirth: Secondary | ICD-10-CM | POA: Diagnosis present

## 2021-05-20 DIAGNOSIS — Z880 Allergy status to penicillin: Secondary | ICD-10-CM | POA: Diagnosis not present

## 2021-05-20 DIAGNOSIS — Z7901 Long term (current) use of anticoagulants: Secondary | ICD-10-CM | POA: Diagnosis not present

## 2021-05-20 DIAGNOSIS — R531 Weakness: Secondary | ICD-10-CM | POA: Diagnosis present

## 2021-05-20 DIAGNOSIS — R29704 NIHSS score 4: Secondary | ICD-10-CM | POA: Diagnosis not present

## 2021-05-20 LAB — RAPID URINE DRUG SCREEN, HOSP PERFORMED
Amphetamines: NOT DETECTED
Barbiturates: NOT DETECTED
Benzodiazepines: NOT DETECTED
Cocaine: NOT DETECTED
Opiates: NOT DETECTED
Tetrahydrocannabinol: NOT DETECTED

## 2021-05-20 LAB — URIC ACID: Uric Acid, Serum: 5.5 mg/dL (ref 2.5–7.1)

## 2021-05-20 LAB — HIV ANTIBODY (ROUTINE TESTING W REFLEX): HIV Screen 4th Generation wRfx: NONREACTIVE

## 2021-05-20 LAB — HEMOGLOBIN A1C
Hgb A1c MFr Bld: 5.4 % (ref 4.8–5.6)
Mean Plasma Glucose: 108 mg/dL

## 2021-05-20 LAB — PROTEIN / CREATININE RATIO, URINE
Creatinine, Urine: 76.74 mg/dL
Protein Creatinine Ratio: 0.21 mg/mg{Cre} — ABNORMAL HIGH (ref 0.00–0.15)
Total Protein, Urine: 16 mg/dL

## 2021-05-20 MED ORDER — PREDNISONE 20 MG PO TABS: 60.0000 mg | ORAL_TABLET | Freq: Every day | ORAL | Status: DC

## 2021-05-20 MED ORDER — ACETAMINOPHEN 160 MG/5ML PO SOLN
650.0000 mg | ORAL | Status: DC | PRN
  Filled 2021-05-20: qty 20.3

## 2021-05-20 MED ORDER — VALACYCLOVIR HCL 500 MG PO TABS
1000.0000 mg | ORAL_TABLET | Freq: Three times a day (TID) | ORAL | Status: DC
  Administered 2021-05-20 – 2021-05-25 (×14): 1000 mg via ORAL
  Filled 2021-05-20 (×17): qty 2

## 2021-05-20 MED ORDER — PREDNISONE 50 MG PO TABS
60.0000 mg | ORAL_TABLET | Freq: Every day | ORAL | Status: DC
  Administered 2021-05-21 – 2021-05-25 (×5): 60 mg via ORAL
  Filled 2021-05-20 (×2): qty 1
  Filled 2021-05-20: qty 3
  Filled 2021-05-20 (×2): qty 1

## 2021-05-20 MED ORDER — ACETAMINOPHEN 325 MG PO TABS
650.0000 mg | ORAL_TABLET | ORAL | Status: DC | PRN
  Administered 2021-05-20: 650 mg via ORAL
  Filled 2021-05-20 (×2): qty 2

## 2021-05-20 MED ORDER — ACETAMINOPHEN 650 MG RE SUPP: 650.0000 mg | RECTAL | Status: DC | PRN

## 2021-05-20 NOTE — Progress Notes (Signed)
EEG complete - results pending 

## 2021-05-20 NOTE — Progress Notes (Signed)
Left upper extremity venous duplex has been completed. Preliminary results can be found in CV Proc through chart review.   05/20/21 10:50 AM Olen Cordial RVT

## 2021-05-20 NOTE — Progress Notes (Signed)
S: Patient reports feeling overall fine. HA has been responsive to tylenol - 4/10. No change in vision, worsening LE edema, or epigastric/RUQ pain. Denies contractions, LOF and bleeding. +FM.   O:  Vitals:   05/20/21 0630 05/20/21 0700  BP: (!) 128/96 117/89  Pulse: 96 99  Resp: 18 15  Temp:    SpO2: 97% 98%   NAD, A&O NWOB Abd soft, nondistended, gravid   A/P: 35 yo G3P0 @ 38.5 wga admitted with left sided weakness, slurred speech, and facial swelling concerning for acute intracranial process. 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 She has been anti-coagulated therapeutically this entire pregnancy and follows closely with heme/onc.   # L weakness - poss TIA vs stroke vs complicated migraine: - Appreciate internal med and neuro management - MRI/CT negative for acute intravranial process - ECHO planned to exclude PFO  # Mild BP elevation: No persistent severe range Bps - BP elevation is possibly due to other process given other symptoms. NO severe range Bps thus stroke s/s PIH is less likely. However, cannot completely yet exclude PIH as a cause. Bps currently normal to mild range.  - PIH labs WNL - MFM consultation if BP elevation persists - will plan to start IOL at 39.0 wga once acute process is excluded or sooner prn  # FVL:  - followed by heme/onc - continue heparin BID - lovenox pp  # Term pregnancy: IOL at 55 wga is planned. However, given acute neurologic issues,  - Heparin will need to be stopped at least 12-24 hours prior to IOL - for two stage IOL - SVE in office 05/19/21 closed/thick/high, vertex - GBS positive - PCN in labor - therapeutic lovenox planned pp - NST daily  We will continue to follow this patient.  Rosie Fate MD (on call for [facility] until 05/21/21 at 06:30)

## 2021-05-20 NOTE — Progress Notes (Addendum)
STROKE TEAM PROGRESS NOTE   SUBJECTIVE (INTERVAL HISTORY) Her mom is on the phone.  Overall her condition is stable. She stated that she has had HA since last Wednesday, comes and goes, 2-3 time a day, left frontal temporal and occipital, sharp pain, 20/10, photophobia but no phonophobia or N/V, lasting 2-3 hours and resolves. At "off" time, she still has 3-4/10 constant mild HA. She has hx of migraine but more b/l frontal HA, sharp pain, photophobia and phonophobia but no N/V. Average once every two month. Takes ibuprofen and goes to sleep, after sleep the migraine resolves. This time HA not her typical HA.  Yesterday morning, she had left facial drooling and then found to have left facial droop. At that time she had 3-4/10 HA, not severe HA. She lost her taste on the left. Overnight, she has left eye tearing, and still has left facial droop. Her MRI and MRA all negative.   However, on exam, she was found to have both upper and lower facial droop, more consistent with left bell's palsy.    OBJECTIVE Temp:  [97.7 F (36.5 C)-98.3 F (36.8 C)] 97.7 F (36.5 C) (11/30 1433) Pulse Rate:  [78-120] 95 (11/30 1433) Cardiac Rhythm: Normal sinus rhythm (11/30 1439) Resp:  [13-28] 14 (11/30 1433) BP: (97-147)/(62-111) 113/83 (11/30 1433) SpO2:  [93 %-99 %] 97 % (11/30 1433) Weight:  [103.4 kg] 103.4 kg (11/29 1800)  No results for input(s): GLUCAP in the last 168 hours. Recent Labs  Lab 05/19/21 1558 05/19/21 1610  NA 134* 134*  K 4.6 4.5  CL 103 105  CO2 23  --   GLUCOSE 69* 70  BUN 6 5*  CREATININE 0.66 0.60  CALCIUM 10.0  --    Recent Labs  Lab 05/19/21 1558  AST 16  ALT 13  ALKPHOS 239*  BILITOT 0.3  PROT 6.7  ALBUMIN 2.9*   Recent Labs  Lab 05/19/21 1558 05/19/21 1610  WBC 10.3  --   NEUTROABS 7.8*  --   HGB 13.8 13.9  HCT 41.7 41.0  MCV 87.6  --   PLT 362  --    No results for input(s): CKTOTAL, CKMB, CKMBINDEX, TROPONINI in the last 168 hours. Recent Labs     05/19/21 1558  LABPROT 12.3  INR 0.9   No results for input(s): COLORURINE, LABSPEC, PHURINE, GLUCOSEU, HGBUR, BILIRUBINUR, KETONESUR, PROTEINUR, UROBILINOGEN, NITRITE, LEUKOCYTESUR in the last 72 hours.  Invalid input(s): APPERANCEUR     Component Value Date/Time   CHOL 316 (H) 05/19/2021 1852   TRIG 219 (H) 05/19/2021 1852   HDL 111 05/19/2021 1852   CHOLHDL 2.8 05/19/2021 1852   VLDL 44 (H) 05/19/2021 1852   LDLCALC 161 (H) 05/19/2021 1852   Lab Results  Component Value Date   HGBA1C 5.2 01/13/2017      Component Value Date/Time   LABOPIA NONE DETECTED 05/20/2021 1100   COCAINSCRNUR NONE DETECTED 05/20/2021 1100   LABBENZ NONE DETECTED 05/20/2021 1100   AMPHETMU NONE DETECTED 05/20/2021 1100   THCU NONE DETECTED 05/20/2021 1100   LABBARB NONE DETECTED 05/20/2021 1100    No results for input(s): ETH in the last 168 hours.  I have personally reviewed the radiological images below and agree with the radiology interpretations.  DG Chest 2 View  Result Date: 05/11/2021 CLINICAL DATA:  Shortness of breath EXAM: CHEST - 2 VIEW COMPARISON:  None. FINDINGS: The heart size and mediastinal contours are within normal limits. Both lungs are clear. The visualized skeletal structures  are unremarkable. IMPRESSION: No active cardiopulmonary disease. Electronically Signed   By: Jasmine Pang M.D.   On: 05/11/2021 15:15   MR ANGIO HEAD WO CONTRAST  Result Date: 05/19/2021 CLINICAL DATA:  Neuro deficit, acute, stroke suspected. Left facial droop, slight left arm drift, left ear pain, and decreased sensation in the left face. EXAM: MRI HEAD WITHOUT CONTRAST MRA HEAD WITHOUT CONTRAST MRA NECK WITHOUT CONTRAST TECHNIQUE: Multiplanar, multiecho pulse sequences of the brain and surrounding structures were obtained without intravenous contrast. Angiographic images of the Circle of Willis were obtained using MRA technique without intravenous contrast. Angiographic images of the neck were obtained  using MRA technique without intravenous contrast. Carotid stenosis measurements (when applicable) are obtained utilizing NASCET criteria, using the distal internal carotid diameter as the denominator. COMPARISON:  Head CT 05/19/2021 FINDINGS: MRI HEAD FINDINGS Brain: There is no evidence of an acute infarct, intracranial hemorrhage, mass, midline shift, or extra-axial fluid collection. The ventricles and sulci are normal. The brain is normal in signal. A dilated perivascular space is incidentally noted inferiorly in the right basal ganglia. There is also an incidental subcentimeter right choroidal fissure cyst. Vascular: Major intracranial vascular flow voids are preserved. Skull and upper cervical spine: Unremarkable bone marrow signal. Sinuses/Orbits: Unremarkable orbits. Paranasal sinuses and mastoid air cells are clear. Other: None. MRA HEAD FINDINGS The intracranial vertebral arteries are widely patent to the basilar and codominant. Patent PICA and SCA origins are seen bilaterally. The basilar artery is widely patent. There is a small left posterior communicating artery. Both PCAs are patent without evidence of a significant proximal stenosis. The internal carotid arteries are widely patent from skull base to carotid termini. ACAs and MCAs are patent without evidence of a proximal branch occlusion or significant proximal stenosis. No aneurysm is identified. MRA NECK FINDINGS There is mild motion artifact. There is a standard 3 vessel aortic arch with widely patent arch vessel origins. The common carotid and cervical internal carotid arteries are patent and smooth bilaterally without evidence of a significant stenosis or dissection. The vertebral arteries are patent and codominant with antegrade flow and no evidence of a significant stenosis or dissection. IMPRESSION: Negative head MRI, head MRA, and neck MRA. These results were called by telephone at the time of interpretation on 05/19/2021 at 6:37 pm to Dr.  Lynden Oxford, who verbally acknowledged these results. Electronically Signed   By: Sebastian Ache M.D.   On: 05/19/2021 18:39   MR ANGIO NECK WO CONTRAST  Result Date: 05/19/2021 CLINICAL DATA:  Neuro deficit, acute, stroke suspected. Left facial droop, slight left arm drift, left ear pain, and decreased sensation in the left face. EXAM: MRI HEAD WITHOUT CONTRAST MRA HEAD WITHOUT CONTRAST MRA NECK WITHOUT CONTRAST TECHNIQUE: Multiplanar, multiecho pulse sequences of the brain and surrounding structures were obtained without intravenous contrast. Angiographic images of the Circle of Willis were obtained using MRA technique without intravenous contrast. Angiographic images of the neck were obtained using MRA technique without intravenous contrast. Carotid stenosis measurements (when applicable) are obtained utilizing NASCET criteria, using the distal internal carotid diameter as the denominator. COMPARISON:  Head CT 05/19/2021 FINDINGS: MRI HEAD FINDINGS Brain: There is no evidence of an acute infarct, intracranial hemorrhage, mass, midline shift, or extra-axial fluid collection. The ventricles and sulci are normal. The brain is normal in signal. A dilated perivascular space is incidentally noted inferiorly in the right basal ganglia. There is also an incidental subcentimeter right choroidal fissure cyst. Vascular: Major intracranial vascular flow voids are preserved.  Skull and upper cervical spine: Unremarkable bone marrow signal. Sinuses/Orbits: Unremarkable orbits. Paranasal sinuses and mastoid air cells are clear. Other: None. MRA HEAD FINDINGS The intracranial vertebral arteries are widely patent to the basilar and codominant. Patent PICA and SCA origins are seen bilaterally. The basilar artery is widely patent. There is a small left posterior communicating artery. Both PCAs are patent without evidence of a significant proximal stenosis. The internal carotid arteries are widely patent from skull base to  carotid termini. ACAs and MCAs are patent without evidence of a proximal branch occlusion or significant proximal stenosis. No aneurysm is identified. MRA NECK FINDINGS There is mild motion artifact. There is a standard 3 vessel aortic arch with widely patent arch vessel origins. The common carotid and cervical internal carotid arteries are patent and smooth bilaterally without evidence of a significant stenosis or dissection. The vertebral arteries are patent and codominant with antegrade flow and no evidence of a significant stenosis or dissection. IMPRESSION: Negative head MRI, head MRA, and neck MRA. These results were called by telephone at the time of interpretation on 05/19/2021 at 6:37 pm to Dr. Lynden Oxford, who verbally acknowledged these results. Electronically Signed   By: Sebastian Ache M.D.   On: 05/19/2021 18:39   MR BRAIN WO CONTRAST  Result Date: 05/19/2021 CLINICAL DATA:  Neuro deficit, acute, stroke suspected. Left facial droop, slight left arm drift, left ear pain, and decreased sensation in the left face. EXAM: MRI HEAD WITHOUT CONTRAST MRA HEAD WITHOUT CONTRAST MRA NECK WITHOUT CONTRAST TECHNIQUE: Multiplanar, multiecho pulse sequences of the brain and surrounding structures were obtained without intravenous contrast. Angiographic images of the Circle of Willis were obtained using MRA technique without intravenous contrast. Angiographic images of the neck were obtained using MRA technique without intravenous contrast. Carotid stenosis measurements (when applicable) are obtained utilizing NASCET criteria, using the distal internal carotid diameter as the denominator. COMPARISON:  Head CT 05/19/2021 FINDINGS: MRI HEAD FINDINGS Brain: There is no evidence of an acute infarct, intracranial hemorrhage, mass, midline shift, or extra-axial fluid collection. The ventricles and sulci are normal. The brain is normal in signal. A dilated perivascular space is incidentally noted inferiorly in the  right basal ganglia. There is also an incidental subcentimeter right choroidal fissure cyst. Vascular: Major intracranial vascular flow voids are preserved. Skull and upper cervical spine: Unremarkable bone marrow signal. Sinuses/Orbits: Unremarkable orbits. Paranasal sinuses and mastoid air cells are clear. Other: None. MRA HEAD FINDINGS The intracranial vertebral arteries are widely patent to the basilar and codominant. Patent PICA and SCA origins are seen bilaterally. The basilar artery is widely patent. There is a small left posterior communicating artery. Both PCAs are patent without evidence of a significant proximal stenosis. The internal carotid arteries are widely patent from skull base to carotid termini. ACAs and MCAs are patent without evidence of a proximal branch occlusion or significant proximal stenosis. No aneurysm is identified. MRA NECK FINDINGS There is mild motion artifact. There is a standard 3 vessel aortic arch with widely patent arch vessel origins. The common carotid and cervical internal carotid arteries are patent and smooth bilaterally without evidence of a significant stenosis or dissection. The vertebral arteries are patent and codominant with antegrade flow and no evidence of a significant stenosis or dissection. IMPRESSION: Negative head MRI, head MRA, and neck MRA. These results were called by telephone at the time of interpretation on 05/19/2021 at 6:37 pm to Dr. Lynden Oxford, who verbally acknowledged these results. Electronically Signed   By:  Sebastian Ache M.D.   On: 05/19/2021 18:39   [place] MFM FETAL BPP WO NON STRESS  Result Date: 05/05/2021 ----------------------------------------------------------------------  OBSTETRICS REPORT                       (Signed Final 05/05/2021 10:12 am) ---------------------------------------------------------------------- Patient Info  ID #:       [id number]                          D.O.B.:  Apr 14, 1986 (35 yrs)  Name:       Christine Wong                   Visit Date: 05/05/2021 08:52 am ---------------------------------------------------------------------- Performed By  Attending:        Ma Rings MD         Ref. Address:     [facility]                                                             [address]                                                             [place]                                                             [address]  Performed By:     Tommie Raymond BS,       Location:         [facility]                    RD[facility], [facility]T                                [facility]  Referred By:      Lyn Henri MD ---------------------------------------------------------------------- Orders  #  Description                           Code        Ordered By  1  [place] MFM OB FOLLOW UP                   [phone]    YU FANG  2  [place] MFM FETAL BPP WO NON               76819.01    YU FANG     STRESS ----------------------------------------------------------------------  #  Order #                     Accession #                Episode #  1  [id number]                   [id number]                 [id number]  2  [id number]                   [id number]                 [id number] ---------------------------------------------------------------------- Indications  Medical complication of pregnancy (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 Cord Insertion:      Previously Visualized  Amniotic Fluid  AFI FV:      Within normal limits  AFI Sum(cm)     %Tile       Largest Pocket(cm)  18.8            71          7.3  RUQ(cm)       RLQ(cm)       LUQ(cm)        LLQ(cm)  6.1           2.9           7.3  2.4 ---------------------------------------------------------------------- Biophysical Evaluation  Amniotic F.V:   Pocket => 2 cm             F. Tone:        Observed  F. Movement:    Observed                   Score:          8/8  F. Breathing:   Observed ---------------------------------------------------------------------- Biometry  BPD:      88.2  mm     G. Age:  35w 5d         36  %    CI:        70.82   %    70 - 86                                                          FL/HC:      20.8   %    20.8 - 22.6  HC:       334   mm     G. Age:  38w 1d         61  %    HC/AC:      1.02        0.92 - 1.05  AC:      327.6  mm     G. Age:  36w 5d         65  %    FL/BPD:     78.8   %    71 - 87  FL:       69.5  mm     G. Age:  35w 5d         24  %    FL/AC:      21.2   %    20 - 24  Est. FW:    2945  gm      6 lb 8 oz     51  % ---------------------------------------------------------------------- OB History  Blood Type:   O+  Gravidity:    3         Term:   0        Prem:   0        SAB:   2  TOP:          0       Ectopic:  0        Living: 0 ---------------------------------------------------------------------- Gestational Age  LMP:           36w 4d        Date:  08/22/20                 EDD:   05/29/21  U/S Today:     36w 4d                                        EDD:   05/29/21  Best:          36w 4d     Det. By:  LMP  (08/22/20)          EDD:   05/29/21  ---------------------------------------------------------------------- Anatomy  Cranium:  Appears normal         Aortic Arch:            Previously seen  Cavum:                 Appears normal         Ductal Arch:            Previously seen  Ventricles:            Previously seen        Diaphragm:              Appears normal  Choroid Plexus:        Previously seen        Stomach:                Appears normal, left                                                                        sided  Cerebellum:            Previously seen        Abdomen:                Previously seen  Posterior Fossa:       Previously seen        Abdominal Wall:         Previously seen  Nuchal Fold:           Not applicable (>20    Cord Vessels:           Previously seen                         wks GA)  Face:                  Orbits and profile     Kidneys:                Appear normal                         previously seen  Lips:                  Previously seen        Bladder:                Appears normal  Thoracic:              Appears normal         Spine:                  Previously seen  Heart:                 Appears normal; EIF    Upper Extremities:      Previously seen  RVOT:                  Previously seen        Lower Extremities:      Previously seen  LVOT:                  Previously seen  Other:  Fetus appears to be female. Nasal bone, Lenses, VC, 3VV and 3VTV          previously visualized. Heels/feet and open hands/5th digits previously          visualized. Technically difficult due to fetal position. ---------------------------------------------------------------------- Cervix Uterus Adnexa  Cervix  Normal appearance by transabdominal scan.  Uterus  No abnormality visualized.  Right Ovary  Within normal limits.  Left Ovary  Not visualized.  Cul De Sac  No free fluid seen.  Adnexa  No abnormality visualized. 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  Her pregnancy has  also been complicated by advanced maternal age and  maternal obesity.  She denies any problems since her last  exam.  She remains on Lovenox 150 mg daily.  She was informed that the fetal growth and amniotic fluid  level appears appropriate for her gestational age.  A biophysical profile performed today was 8 out of 8.  In regards to the management of her anticoagulation and  delivery, she should probably continue Lovenox up until the  day before her scheduled delivery date, which should occur  at around 39 weeks.  As she is treated with a high dose of  anticoagulation, she may be eligible to receive regional  anesthesia if it has been 24 hours or more since she received  the anticoagulation medication.  Due to her underlying medical conditions, she should  continue weekly NSTs in your office until delivery.  No further exams were scheduled in our office. ----------------------------------------------------------------------                   Ma Rings, MD Electronically Signed Final Report   05/05/2021 10:12 am ----------------------------------------------------------------------  ECHOCARDIOGRAM COMPLETE  Result Date: 05/12/2021    ECHOCARDIOGRAM REPORT   Patient Name:   Christine Wong Date of Exam: 05/12/2021 Medical Rec #:  [id number]      Height:       62.5 in Accession #:    [id number]     Weight:       226.4 lb Date of Birth:  03/14/1986      BSA:          2.027 m Patient Age:    35 years       BP:           110/90 mmHg Patient Gender: F              HR:           91 bpm. Exam Location:  [address] Procedure: 2D Echo, 3D Echo, Cardiac Doppler, Color Doppler and Strain Analysis Indications:    R01.1 Murmur  History:        Patient has no prior history of Echocardiogram examinations.                 Risk Factors:[redacted] weeks pregnant.  Sonographer:     Daphine Deutscher RDCS Referring Phys: [phone] MARY E BRANCH IMPRESSIONS  1. Left ventricular ejection fraction, by estimation, is 60 to 65%. Left ventricular ejection fraction by 3D volume is 56 %. The left ventricle has normal function. The left ventricle has no regional wall motion abnormalities. Left ventricular diastolic  parameters were normal. The average left ventricular global longitudinal strain is -27.5 %. The global longitudinal strain is normal.  2. Right ventricular systolic  function is normal. The right ventricular size is normal. Tricuspid regurgitation signal is inadequate for assessing PA pressure.  3. The mitral valve is normal in structure. Trivial mitral valve regurgitation. No evidence of mitral stenosis.  4. The aortic valve is tricuspid. Aortic valve regurgitation is trivial. No aortic stenosis is present. Normal coronary artery origins.  5. The inferior vena cava is normal in size with greater than 50% respiratory variability, suggesting right atrial pressure of 3 mmHg. Conclusion(s)/Recommendation(s): Normal biventricular function without evidence of hemodynamically significant valvular heart disease. FINDINGS  Left Ventricle: Left ventricular ejection fraction, by estimation, is 60 to 65%. Left ventricular ejection fraction by 3D volume is 56 %. The left ventricle has normal function. The left ventricle has no regional wall motion abnormalities. The average left ventricular global longitudinal strain is -27.5 %. The global longitudinal strain is normal. The left ventricular internal cavity size was normal in size. There is no left ventricular hypertrophy. Left ventricular diastolic parameters were normal. Right Ventricle: The right ventricular size is normal. No increase in right ventricular wall thickness. Right ventricular systolic function is normal. Tricuspid regurgitation signal is inadequate for assessing PA pressure. The tricuspid regurgitant velocity is 1.68 m/s, and with an  assumed right atrial pressure of 3 mmHg, the estimated right ventricular systolic pressure is 14.3 mmHg. Left Atrium: Left atrial size was normal in size. Right Atrium: Right atrial size was normal in size. Pericardium: There is no evidence of pericardial effusion. Mitral Valve: The mitral valve is normal in structure. Trivial mitral valve regurgitation. No evidence of mitral valve stenosis. Tricuspid Valve: The tricuspid valve is normal in structure. Tricuspid valve regurgitation is trivial. No evidence of tricuspid stenosis. Aortic Valve: The aortic valve is tricuspid. Aortic valve regurgitation is trivial. No aortic stenosis is present. Normal coronary artery origins. Pulmonic Valve: The pulmonic valve was normal in structure. Pulmonic valve regurgitation is trivial. No evidence of pulmonic stenosis. Aorta: The aortic root is normal in size and structure. Venous: The inferior vena cava is normal in size with greater than 50% respiratory variability, suggesting right atrial pressure of 3 mmHg. IAS/Shunts: No atrial level shunt detected by color flow Doppler.  LEFT VENTRICLE PLAX 2D LVIDd:         4.40 cm         Diastology LVIDs:         3.10 cm         LV e' medial:    7.18 cm/s LV PW:         0.80 cm         LV E/e' medial:  10.4 LV IVS:        0.80 cm         LV e' lateral:   14.30 cm/s LVOT diam:     2.10 cm         LV E/e' lateral: 5.2 LV SV:         60 LV SV Index:   29              2D LVOT Area:     3.46 cm        Longitudinal                                Strain  2D Strain GLS  -27.1 %                                (A2C):                                2D Strain GLS  -27.7 %                                (A3C):                                2D Strain GLS  -27.8 %                                (A4C):                                2D Strain GLS  -27.5 %                                Avg:                                 3D Volume EF                                LV 3D  EF:    Left                                             ventricul                                             ar                                             ejection                                             fraction                                             by 3D                                             volume is  56 %.                                 3D Volume EF:                                3D EF:        56 %                                LV EDV:       123 ml                                LV ESV:       54 ml                                LV SV:        70 ml RIGHT VENTRICLE RV Basal diam:  3.60 cm RV S prime:     14.30 cm/s TAPSE (M-mode): 2.0 cm LEFT ATRIUM             Index        RIGHT ATRIUM          Index LA diam:        3.20 cm 1.58 cm/m   RA Area:     8.45 cm LA Vol (A2C):   38.8 ml 19.14 ml/m  RA Volume:   16.40 ml 8.09 ml/m LA Vol (A4C):   29.8 ml 14.70 ml/m LA Biplane Vol: 36.4 ml 17.96 ml/m  AORTIC VALVE LVOT Vmax:   101.50 cm/s LVOT Vmean:  68.050 cm/s LVOT VTI:    0.172 m  AORTA Ao Root diam: 3.00 cm Ao Asc diam:  2.70 cm MITRAL VALVE               TRICUSPID VALVE MV Area (PHT): 4.15 cm    TR Peak grad:   11.3 mmHg MV Decel Time: 183 msec    TR Vmax:        168.00 cm/s MV E velocity: 74.60 cm/s MV A velocity: 68.00 cm/s  SHUNTS MV E/A ratio:  1.10        Systemic VTI:  0.17 m                            Systemic Diam: 2.10 cm Weston Brass MD Electronically signed by Weston Brass MD Signature Date/Time: 05/12/2021/4:45:30 PM    Final    CT HEAD CODE STROKE WO CONTRAST  Result Date: 05/19/2021 CLINICAL DATA:  Code stroke. Neuro deficit, acute, stroke suspected. Additional history provided: Weakness. Additional history obtained from electronic MEDICAL RECORD NUMBERLeft-sided facial droop, slight left arm drift, left ear pain, decreased sensation left face. EXAM: CT HEAD WITHOUT CONTRAST TECHNIQUE: Contiguous axial images were obtained  from the base of the skull through the vertex without intravenous contrast. COMPARISON:  No pertinent prior exams available for comparison. FINDINGS: Brain: Cerebral volume is normal. Subcentimeter circumscribed hypodensity within the inferior right basal ganglia most consistent with a prominent perivascular space. Suspected small choroid fissure cyst on the right. There is no acute intracranial hemorrhage. No demarcated cortical infarct. No  extra-axial fluid collection. No evidence of an intracranial mass. No midline shift. Vascular: No hyperdense vessel. Skull: Normal. Negative for fracture or focal lesion. Sinuses/Orbits: Visualized orbits show no acute finding. Trace mucosal thickening within the right maxillary sinus at the imaged levels. ASPECTS [facility] Stroke Program Early CT Score) - Ganglionic level infarction (caudate, lentiform nuclei, internal capsule, insula, M1-M3 cortex): 7 - Supraganglionic infarction (M4-M6 cortex): 3 Total score (0-10 with 10 being normal): 10 These results were communicated to Dr. Otelia Limes At 4:16 pmon 11/29/2022by text page via the [facility] messaging system. IMPRESSION: No evidence of acute intracranial abnormality. ASPECTS is 10. Electronically Signed   By: Jackey Loge D.O.   On: 05/19/2021 16:18   VAS [place] LOWER EXTREMITY VENOUS (DVT)  Result Date: 05/04/2021  Lower Venous DVT Study Patient Name:  Christine Wong  Date of Exam:   05/04/2021 Medical Rec #: [id number]       Accession #:    [id number] Date of Birth: 05-16-1986       Patient Gender: F Patient Age:   50 years Exam Location:  [facility] Procedure:      VAS [place] LOWER EXTREMITY VENOUS (DVT) Referring Phys: Jeanie Sewer --------------------------------------------------------------------------------  Indications: Pain, and Swelling.  Risk Factors: Factor V Leiden HX of PE & LLE DVT, currently pregnant. Anticoagulation: Lovenox. Comparison Study: No previous exams Performing Technologist: Jody Hill RVT, RDMS   Examination Guidelines: A complete evaluation includes B-mode imaging, spectral Doppler, color Doppler, and power Doppler as needed of all accessible portions of each vessel. Bilateral testing is considered an integral part of a complete examination. Limited examinations for reoccurring indications may be performed as noted. The reflux portion of the exam is performed with the patient in reverse Trendelenburg.  +-----+---------------+---------+-----------+----------+--------------+ RIGHTCompressibilityPhasicitySpontaneityPropertiesThrombus Aging +-----+---------------+---------+-----------+----------+--------------+ CFV  Full           Yes      Yes                                 +-----+---------------+---------+-----------+----------+--------------+   +---------+---------------+---------+-----------+----------+--------------+ LEFT     CompressibilityPhasicitySpontaneityPropertiesThrombus Aging +---------+---------------+---------+-----------+----------+--------------+ CFV      Full           Yes      Yes                                 +---------+---------------+---------+-----------+----------+--------------+ SFJ      Full                                                        +---------+---------------+---------+-----------+----------+--------------+ FV Prox  Full           Yes      Yes                                 +---------+---------------+---------+-----------+----------+--------------+ FV Mid   Full           Yes      Yes                                 +---------+---------------+---------+-----------+----------+--------------+ FV DistalFull  Yes      Yes                                 +---------+---------------+---------+-----------+----------+--------------+ PFV      Full                                                        +---------+---------------+---------+-----------+----------+--------------+ POP      Full           Yes       Yes                                 +---------+---------------+---------+-----------+----------+--------------+ PTV      Full                                                        +---------+---------------+---------+-----------+----------+--------------+ PERO     Full                                                        +---------+---------------+---------+-----------+----------+--------------+ Rouleaux flow noted in CFV.   Summary: RIGHT: - No evidence of common femoral vein obstruction.  LEFT: - There is no evidence of deep vein thrombosis in the lower extremity. - There is no evidence of superficial venous thrombosis.  - No cystic structure found in the popliteal fossa.  *See table(s) above for measurements and observations. Electronically signed by Sherald Hess MD on 05/04/2021 at 12:51:54 PM.    Final    VAS [place] UPPER EXTREMITY VENOUS DUPLEX  Result Date: 05/20/2021 UPPER VENOUS STUDY  Patient Name:  Christine Wong  Date of Exam:   05/20/2021 Medical Rec #: [id number]       Accession #:    [id number] Date of Birth: June 15, 1986       Patient Gender: F Patient Age:   47 years Exam Location:  [facility] Procedure:      VAS [place] UPPER EXTREMITY VENOUS DUPLEX Referring Phys: MARSHALL CHAMBLISS --------------------------------------------------------------------------------  Indications: Swelling Risk Factors: Current pregnancy. Comparison Study: No prior studies. Performing Technologist: Chanda Busing RVT  Examination Guidelines: A complete evaluation includes B-mode imaging, spectral Doppler, color Doppler, and power Doppler as needed of all accessible portions of each vessel. Bilateral testing is considered an integral part of a complete examination. Limited examinations for reoccurring indications may be performed as noted.  Right Findings: +----------+------------+---------+-----------+----------+-------+ RIGHT     CompressiblePhasicitySpontaneousPropertiesSummary  +----------+------------+---------+-----------+----------+-------+ Subclavian    Full       Yes       Yes                      +----------+------------+---------+-----------+----------+-------+  Left Findings: +----------+------------+---------+-----------+----------+-------+ LEFT      CompressiblePhasicitySpontaneousPropertiesSummary +----------+------------+---------+-----------+----------+-------+ IJV           Full       Yes  Yes                      +----------+------------+---------+-----------+----------+-------+ Subclavian    Full       Yes       Yes                      +----------+------------+---------+-----------+----------+-------+ Axillary      Full       Yes       Yes                      +----------+------------+---------+-----------+----------+-------+ Brachial      Full       Yes       Yes                      +----------+------------+---------+-----------+----------+-------+ Radial        Full                                          +----------+------------+---------+-----------+----------+-------+ Ulnar         Full                                          +----------+------------+---------+-----------+----------+-------+ Cephalic      Full                                          +----------+------------+---------+-----------+----------+-------+ Basilic       Full                                          +----------+------------+---------+-----------+----------+-------+  Summary:  Right: No evidence of thrombosis in the subclavian.  Left: No evidence of deep vein thrombosis in the upper extremity. No evidence of superficial vein thrombosis in the upper extremity.  *See table(s) above for measurements and observations.     Preliminary    [place] MFM OB FOLLOW UP  Result Date: 05/05/2021 ----------------------------------------------------------------------  OBSTETRICS REPORT                       (Signed Final 05/05/2021  10:12 am) ---------------------------------------------------------------------- Patient Info  ID #:       [id number]                          D.O.B.:  1986-05-13 (35 yrs)  Name:       Christine Wong                  Visit Date: 05/05/2021 08:52 am ---------------------------------------------------------------------- Performed By  Attending:        Ma Rings MD         Ref. Address:     [facility]                                                             [address]                                                             [place]                                                             [address]  Performed By:     Tommie Raymond BS,       Location[facility]                    RDMS, RVT                                [facility]  Referred By:      Lyn Henri MD ---------------------------------------------------------------------- Orders  #  Description                           Code        Ordered By  1  [place] MFM OB FOLLOW UP                   76816.01    YU FANG  2  [place] MFM FETAL BPP WO NON               60454.09    YU FANG     STRESS ----------------------------------------------------------------------  #  Order #  Accession #                Episode #  1  [id number]                   [id number]                 [id number]  2  [id number]                   [id number]                 [id number] ---------------------------------------------------------------------- Indications  Medical complication of pregnancy (Factor V    O26.90  Leiden, Hx DVT/PE)  Obesity complicating pregnancy, third          O99.213  trimester (BMI 37)  [redacted] weeks gestation of pregnancy                 Z3A.24  Advanced maternal age multigravida 18+,        O51.523  third trimester  Anxiety during pregnancy, third trimester      O99.343, F41.9 ---------------------------------------------------------------------- Fetal Evaluation  Num Of Fetuses:         1  Fetal Heart Rate(bpm):  124  Cardiac Activity:       Observed  Presentation:           Cephalic  Placenta:               Anterior  P. Cord Insertion:      Previously Visualized  Amniotic Fluid  AFI FV:      Within normal limits  AFI Sum(cm)     %Tile       Largest Pocket(cm)  18.8            71          7.3  RUQ(cm)       RLQ(cm)       LUQ(cm)        LLQ(cm)  6.1           2.9           7.3            2.4 ---------------------------------------------------------------------- Biophysical Evaluation  Amniotic F.V:   Pocket => 2 cm             F. Tone:        Observed  F. Movement:    Observed                   Score:          8/8  F. Breathing:   Observed ---------------------------------------------------------------------- Biometry  BPD:      88.2  mm     G. Age:  35w 5d         36  %    CI:        70.82   %    70 - 86                                                          FL/HC:      20.8   %    20.8 - 22.6  HC:       334   mm     G. Age:  38w 1d  61  %    HC/AC:      1.02        0.92 - 1.05  AC:      327.6  mm     G. Age:  36w 5d         65  %    FL/BPD:     78.8   %    71 - 87  FL:       69.5  mm     G. Age:  35w 5d         24  %    FL/AC:      21.2   %    20 - 24  Est. FW:    2945  gm      6 lb 8 oz     51  % ---------------------------------------------------------------------- OB History  Blood Type:   O+  Gravidity:    3         Term:   0        Prem:   0        SAB:   2  TOP:          0       Ectopic:  0        Living: 0 ---------------------------------------------------------------------- Gestational Age  LMP:           36w 4d        Date:  08/22/20                 EDD:   05/29/21  U/S Today:     36w 4d                                         EDD:   05/29/21  Best:          36w 4d     Det. By:  LMP  (08/22/20)          EDD:   05/29/21 ---------------------------------------------------------------------- Anatomy  Cranium:               Appears normal         Aortic Arch:            Previously seen  Cavum:                 Appears normal         Ductal Arch:            Previously seen  Ventricles:            Previously seen        Diaphragm:              Appears normal  Choroid Plexus:        Previously seen        Stomach:                Appears normal, left                                                                        sided  Cerebellum:  Previously seen        Abdomen:                Previously seen  Posterior Fossa:       Previously seen        Abdominal Wall:         Previously seen  Nuchal Fold:           Not applicable (>20    Cord Vessels:           Previously seen                         wks GA)  Face:                  Orbits and profile     Kidneys:                Appear normal                         previously seen  Lips:                  Previously seen        Bladder:                Appears normal  Thoracic:              Appears normal         Spine:                  Previously seen  Heart:                 Appears normal; EIF    Upper Extremities:      Previously seen  RVOT:                  Previously seen        Lower Extremities:      Previously seen  LVOT:                  Previously seen  Other:  Fetus appears to be female. Nasal bone, Lenses, VC, 3VV and 3VTV          previously visualized. Heels/feet and open hands/5th digits previously          visualized. Technically difficult due to fetal position. ---------------------------------------------------------------------- Cervix Uterus Adnexa  Cervix  Normal appearance by transabdominal scan.  Uterus  No abnormality visualized.  Right Ovary  Within normal limits.  Left Ovary  Not visualized.  Cul De Sac  No free fluid seen.  Adnexa  No abnormality visualized.  ---------------------------------------------------------------------- Comments  This patient was seen for a follow up growth scan as she is  heterozygous for the factor V Leiden gene mutation with a  history of a prior thromboembolic event.  Her pregnancy has  also been complicated by advanced maternal age and  maternal obesity.  She denies any problems since her last  exam.  She remains on Lovenox 150 mg daily.  She was informed that the fetal growth and amniotic fluid  level appears appropriate for her gestational age.  A biophysical profile performed today was 8 out of 8.  In regards to the management of her anticoagulation and  delivery, she should probably continue Lovenox up until the  day before her scheduled delivery date, which should occur  at around 39 weeks.  As she is  treated with a high dose of  anticoagulation, she may be eligible to receive regional  anesthesia if it has been 24 hours or more since she received  the anticoagulation medication.  Due to her underlying medical conditions, she should  continue weekly NSTs in your office until delivery.  No further exams were scheduled in our office. ----------------------------------------------------------------------                   Ma Rings, MD Electronically Signed Final Report   05/05/2021 10:12 am ----------------------------------------------------------------------    PHYSICAL EXAM  Temp:  [97.7 F (36.5 C)-98.3 F (36.8 C)] 97.7 F (36.5 C) (11/30 1433) Pulse Rate:  [78-120] 95 (11/30 1433) Resp:  [13-28] 14 (11/30 1433) BP: (97-147)/(62-111) 113/83 (11/30 1433) SpO2:  [93 %-99 %] 97 % (11/30 1433) Weight:  [103.4 kg] 103.4 kg (11/29 1800)  General - Well nourished, well developed, in no apparent distress.  Ophthalmologic - fundi not visualized due to noncooperation.  Cardiovascular - Regular rhythm and rate.  Mental Status -  Level of arousal and orientation to time, place, and person were intact. 38+ week  pregnant Language including expression, naming, repetition, comprehension was assessed and found intact. Fund of Knowledge was assessed and was intact.  Cranial Nerves II - XII - II - Visual field intact OU. III, IV, VI - Extraocular movements intact. V - Facial sensation intact bilaterally. VII - left upper and lower facial palsy, left eye tearing with left tongue lost of taste VIII - Hearing & vestibular intact bilaterally. X - Palate elevates symmetrically. XI - Chin turning & shoulder shrug intact bilaterally. XII - Tongue protrusion intact.  Motor Strength - The patient's strength was normal in all extremities and pronator drift was absent.  Bulk was normal and fasciculations were absent.   Motor Tone - Muscle tone was assessed at the neck and appendages and was normal.  Reflexes - The patient's reflexes were symmetrical in all extremities and she had no pathological reflexes.  Sensory - Light touch, temperature/pinprick were assessed and were symmetrical.    Coordination - The patient had normal movements in the hands and feet with no ataxia or dysmetria.  Tremor was absent.  Gait and Station - deferred.   ASSESSMENT/PLAN Ms. Lagretta Loseke is a 35 y.o. 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 No tPA given due to outside window.    Left bell's palsy  CT head no acute finding MRI  negative MRA  unremarkable LDL 161 HgbA1c pending LUE venous doppler no DVT Heparin subq 10,000 U bid for VTE prophylaxis Heparin subq 10,000 U bid prior to admission, now on Heparin subq 10,000 U bid. AC management per OB and oncology. Discussed with OB Dr. Elon Spanner, will start prednisone 60mg  daily x 7 days and acyclovir 1g tid x 7 days. Discussed with Dr. Elon Spanner and in agreement If difficulty with left eye closure, will need artificial tears during the day and ointment at  night Follow up with [facility] in 4 weeks.   Factor V Leiden History of DVT/PE Following with oncology as outpatient Lifelong anticoagulation On Xarelto before pregnancy, on Lovenox during pregnancy, now on heparin 10,000 units twice daily subcu near delivery Continue current regimen  Hyperlipidemia Home meds:  none  LDL 161, goal < 70 Recommend to avoid statin near delivery and breast feeding  Continue statin after breast feeding and  no further pregnancy planned.  Other Stroke Risk Factors 38 week pregnancy Migraine - management per primary team  Other Active Problems Anxiety   Hospital day # 0  Neurology will sign off. Please call with questions. Pt will follow up with stroke clinic NP at [facility] in about 4 weeks. Thanks for the consult.   Marvel Plan, MD PhD Stroke Neurology 05/20/2021 2:56 PM    To contact Stroke Continuity provider, please refer to [email or web address]. After hours, contact General Neurology

## 2021-05-20 NOTE — TOC CAGE-AID Note (Signed)
Transition of Care [facility]) - CAGE-AID Screening   Patient Details  Name: Christine Wong MRN: [id number] Date of Birth: [birth year]  Transition of Care [facility]) CM/SW Contact:    Haeden Hudock C Tarpley-Carter, LCSWA Phone Number: 05/20/2021, 10:03 AM   Clinical Narrative: Pt is unable to participate in Cage Aid.  Yarixa Lightcap Tarpley-Carter, MSW, LCSW-A Pronouns:  She/Her/Hers [facility] Clinical Social Worker Direct Number:  [phone] [email or web address]  CAGE-AID Screening: Substance Abuse Screening unable to be completed due to: : Patient unable to participate             Substance Abuse Education Offered: No

## 2021-05-20 NOTE — Progress Notes (Signed)
RROB went to do daily NST on pt.  FHR is reactive and reassuring.  Pt reports positive fetal movement.  She says UCs eased off early this morning and denies LOF or VB.  Pts speech at this time is not slurred, and she is moving left side of body well.  There is still some facial drooping noted.  Dr Elon Spanner made aware.  No further orders given at this time.

## 2021-05-20 NOTE — Hospital Course (Addendum)
Christine Wong is a 35 y.o. female who was admitted to [facility] on 11/29 for left-sided facial droop, weakness and dysarthria concerning for TIA vs CVA. Her hospital course is outlined below. Refer to the H&P for additional information.   Complex Migraine  Bells Palsy  Code stroke activated in the ED. CT head was negative, ASPECTS 10/10. MRI head and MRA head and neck were also reassuring. CBC and CMP were unremarkable. Neurology consulted and patient underwent further work up with EEG showing no seizures for epileptiform discharges. LUE venous doppler U/S was obtained due to concerns of unilateral swelling and erythema and was negative. Given reassuring labs and imaging studies and patient's unilateral left facial droop, it was thought that patients symptoms were caused from Bell's Palsy. Neurology recommended a 7 day course of valacyclovir and prednisone. OBGYN was consulted for management of pregnancy, and fetal heart tones remained reassuring throughout hospitalization. Patient will be transferred to L&D due to neurology medical clearance for induction of labor. Patient is medically stable for transfer.   Elevated Blood Pressures She had multiple elevated BP readings >140/90, concerning for gestational hypertension and Pre-E. CMP without any abnormalities. She was started on PO Labetalol 100 mg BID with improvement in pressures.  Uric acid and UPC was normal. OB recommends close monitoring of patient's blood pressures.    PCP Follow Up  Follow up with OBGYN  Follow up with Hematologist  Follow up elevated blood pressures Continue prednisone x 7 days - end date 05/28/2021 Continue valcyclovir 1,000 mg  x 7 days - end date 05/27/2021

## 2021-05-20 NOTE — Evaluation (Signed)
Physical Therapy Evaluation and discharge  Patient Details Name: Christine Wong MRN: [id number] DOB: 1985-10-26 Today's Date: 05/20/2021  History of Present Illness  Pt is a 35 y/o female admitted secondary to L facial droop and L extremity sensation deficits. Imaging negative. Pt currently [redacted] weeks pregnant.  PMH includes DVT/PE.  Clinical Impression  Patient evaluated by Physical Therapy with no further acute PT needs identified. All education has been completed and the patient has no further questions. Pt overall at a mod I level. Reporting LLE felt "different", but did not seem to affect mobility. Educated about walking program and to discuss with MD about using compression socks to help with swelling. Also educated about sleeping position. Pt reports she lives with her mother who can assist if needed. See below for any follow-up Physical Therapy or equipment needs. PT is signing off. Thank you for this referral. If needs change, please re-consult.         Recommendations for follow up therapy are one component of a multi-disciplinary discharge planning process, led by the attending physician.  Recommendations may be updated based on patient status, additional functional criteria and insurance authorization.  Follow Up Recommendations No PT follow up    Assistance Recommended at Discharge PRN  Functional Status Assessment Patient has had a recent decline in their functional status and demonstrates the ability to make significant improvements in function in a reasonable and predictable amount of time.  Equipment Recommendations  None recommended by PT    Recommendations for Other Services       Precautions / Restrictions Precautions Precautions: None Restrictions Weight Bearing Restrictions: No      Mobility  Bed Mobility Overal bed mobility: Modified Independent             General bed mobility comments: Increased time required    Transfers Overall transfer level:  Modified independent                      Ambulation/Gait Ambulation/Gait assistance: Modified independent (Device/Increase time) Gait Distance (Feet): 100 Feet Assistive device: None Gait Pattern/deviations: Step-through pattern;Decreased stride length Gait velocity: Decreased     General Gait Details: Waddle type gait, but no overt LOB. Pt reporting the LLE felt "different" but no functional abnormailities noted during gait.  Stairs            Wheelchair Mobility    Modified Rankin (Stroke Patients Only)       Balance Overall balance assessment: No apparent balance deficits (not formally assessed)                                           Pertinent Vitals/Pain Pain Assessment: No/denies pain    Home Living Family/patient expects to be discharged to:: Private residence Living Arrangements: Parent Available Help at Discharge: Family;Available 24 hours/day Type of Home: Apartment Home Access: Level entry       Home Layout: One level Home Equipment: None      Prior Function Prior Level of Function : Independent/Modified Independent                     Hand Dominance        Extremity/Trunk Assessment   Upper Extremity Assessment Upper Extremity Assessment: LUE deficits/detail LUE Deficits / Details: Increased swelling in L hand. pt report it did not feel numb, but felt "different"  Lower Extremity Assessment Lower Extremity Assessment: LLE deficits/detail;RLE deficits/detail RLE Deficits / Details: Increased swelling noted LLE Deficits / Details: Increased swelling noted. Pt reports decreased sensation on top of L foot    Cervical / Trunk Assessment Cervical / Trunk Assessment: Normal  Communication   Communication: No difficulties  Cognition Arousal/Alertness: Awake/alert Behavior During Therapy: WFL for tasks assessed/performed Overall Cognitive Status: Within Functional Limits for tasks assessed                                           General Comments General comments (skin integrity, edema, etc.): Educated about discussing compression sock usage with her MD to help with swelling. Educated about positioning to help with swelling as well.    Exercises     Assessment/Plan    PT Assessment Patient does not need any further PT services  PT Problem List         PT Treatment Interventions      PT Goals (Current goals can be found in the Care Plan section)  Acute Rehab PT Goals Patient Stated Goal: to go home PT Goal Formulation: With patient Time For Goal Achievement: 05/20/21 Potential to Achieve Goals: Good    Frequency     Barriers to discharge        Co-evaluation               AM-PAC PT "6 Clicks" Mobility  Outcome Measure Help needed turning from your back to your side while in a flat bed without using bedrails?: None Help needed moving from lying on your back to sitting on the side of a flat bed without using bedrails?: None Help needed moving to and from a bed to a chair (including a wheelchair)?: None Help needed standing up from a chair using your arms (e.g., wheelchair or bedside chair)?: None Help needed to walk in hospital room?: None Help needed climbing 3-5 steps with a railing? : A Little 6 Click Score: 23    End of Session   Activity Tolerance: Patient tolerated treatment well Patient left: in bed;with call bell/phone within reach (on stretcher in ED) Nurse Communication: Mobility status PT Visit Diagnosis: Other abnormalities of gait and mobility (R26.89)    Time: 1884-1660 PT Time Calculation (min) (ACUTE ONLY): 13 min   Charges:   PT Evaluation $PT Eval Low Complexity: 1 Low          Cindee Salt, DPT  Acute Rehabilitation Services  Pager: [phone] Office: [phone]   Lehman Prom 05/20/2021, 3:36 PM

## 2021-05-20 NOTE — Progress Notes (Addendum)
Family Medicine Teaching Service Daily Progress Note Intern Pager: [phone]  Patient name: Christine Wong Medical record number: [id number] Date of birth: 1986/04/23 Age: 35 y.o. Gender: female  Primary Care Provider: Pcp, No Consultants: OB and Neurology  Code Status: FULL  Pt Overview and Major Events to Date:  11/29 Admitted to FPTS   Assessment and Plan: Christine Wong is a 35 y.o. G27P0020 female [redacted]w[redacted]d presenting with left sided facial droop, dysarthria and left-sided weakness concerning for TIA vs CVA. 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   Acute left-sided weakness concerning for TIA vs. Stroke in the setting of Factor V Leiden vs. Complicated migraine Patient has no new complaints this morning, but her symptoms still have not resolved. Hx of migraines with ear and eye pain aura. Her onset of symptoms for this acute event began on Saturday with sharp ear pain and eye pain. This presentation is suspicious for complicated migraine. She reports decreased sensation on the left side of her face V1-V3. Her smile droops on the left and she reports drooling out of her left side. She is able to raise her eyebrows with decreased movement on the left. Her extremities are 5/5 strength bilaterally.  - neurology following, appreciate recommendations  - differential including Bell's palsy?, will ask neuro and OB for recommendations regarding addition of steroids.  - UPC ordered, with elevated BPs and new onset symptoms cannot exclude preeclampsia from differential.  - Tylenol PRN for HA  - cont Heparin   Hx DVT, PE  Heterozygous Factor V Leiden (actively on Heparin) Patient reports no increasing leg pain or redness. She has no new neurological symptoms, but still endorses swelling and redness in her left arm and leg. With history of blood clots, thrombosis could still be a possibility.  - cont  heparin  - echo pending  - monitor LE for redness and increased pain, may repeat LE venous doppler if symptoms worsen - ordered LUE doppler for concerns of left arm erythema and swelling and hypercoagulable state.   Active pregnancy at [redacted]w[redacted]d, [id number]  Concern for developing gHTN Patient has felt baby move well overnight. She is having intermittent cramping with no vaginal bleeding or leaking fluids.  - OB following, will consider induction once Neurology has s/o  - consider preeclampsia with elevated BPs, HA, and blurry vision, ordered UPC.   FEN/GI: regular diet  PPx: subcutaneous heparin 10,000 U BID  Disposition: med tele   Subjective:  Patient is resting comfortably. She has concerns for reaction to heparin since she began medication on Saturday, and her symptoms started on Sunday - ear pain, eye pain, and swelling in her left arm and leg.  She was previously on Lovenox for DVT on 05/04/2021.   Objective: Temp:  [98.3 F (36.8 C)] 98.3 F (36.8 C) (11/29 1551) Pulse Rate:  [78-120] 99 (11/30 0700) Resp:  [13-28] 15 (11/30 0700) BP: (97-147)/(62-111) 117/89 (11/30 0700) SpO2:  [93 %-99 %] 98 % (11/30 0700) Weight:  [103.4 kg] 103.4 kg (11/29 1800) Physical Exam: General: Resting comfortably, NAD  Cardiovascular: tachycardic with regular rhythm, no murmurs  Respiratory: clear, no wheezing, crackles or rales  Abdomen: gravid, no abdominal pain Extremities: bilateral LE edema with mild increased swelling and erythema on the left LE, no calf pain. Mild swelling in the right UE with mild erythema.  Neuro: grip strength and BUE strength 5/5 and equal bilaterally, sensation decreased on left  Laboratory: Recent Labs  Lab 05/19/21 1558 05/19/21 1610  WBC 10.3  --   HGB 13.8 13.9  HCT 41.7 41.0  PLT 362  --    Recent Labs  Lab 05/19/21 1558 05/19/21 1610  NA 134* 134*  K 4.6 4.5  CL 103 105  CO2 23  --   BUN 6 5*  CREATININE 0.66 0.60  CALCIUM 10.0  --   PROT 6.7   --   BILITOT 0.3  --   ALKPHOS 239*  --   ALT 13  --   AST 16  --   GLUCOSE 69* 70   Imaging/Diagnostic Tests: Echo pending  RUE venous doppler - negative for deep and superficial thrombosis    Glendale Chard, Medical Student 05/20/2021, 7:31 AM OMS4, [facility] FPTS Intern pager: [phone], text pages welcome  Upper Level Addendum: I have seen and evaluated this patient along with Glendale Chard and reviewed the above note, making necessary revisions as appropriate. These are denoted by green text. I agree with the medical decision making and physical exam as noted above. Fayette Pho, MD PGY-2 [facility] Family [facility] Residency

## 2021-05-20 NOTE — Progress Notes (Signed)
OT Cancellation Note  Patient Details Name: Christine Wong MRN: [id number] DOB: 07-02-1985   Cancelled Treatment:    Reason Eval/Treat Not Completed: OT screened, no needs identified, will sign off.   Hillard Danker, OT/L  Acute Rehab [phone]  Lenice Llamas 05/20/2021, 1:07 PM

## 2021-05-20 NOTE — Progress Notes (Signed)
FPTS Interim Progress Note  S:Pt transferred to 3W. Provided with cell phone charger she left in ED.   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 OB plans for induction on Friday.  - last heparin dose on 12/1 AM to prepare for IOL   14/1, DO 05/20/2021, 6:01 PM PGY-3, [facility] Family Medicine Service pager [phone]

## 2021-05-20 NOTE — Procedures (Signed)
Patient Name: Christine Wong  MRN: [id number]  Epilepsy Attending: Charlsie Quest  Referring Physician/Provider: Dr Marvel Plan Date: 05/20/2021 Duration: 22.20 mins  Patient history: 35 y.o. female admitted for left facial droop, drooling, lost of taste and HA. EEG to evaluate for seizure  Level of alertness: Awake, asleep  AEDs during EEG study: None  Technical aspects: This EEG study was done with scalp electrodes positioned according to the 10-20 International system of electrode placement. Electrical activity was acquired at a sampling rate of 500Hz  and reviewed with a high frequency filter of 70Hz  and a low frequency filter of 1Hz . EEG data were recorded continuously and digitally stored.   Description: The posterior dominant rhythm consists of 10 Hz activity of moderate voltage (25-35 uV) seen predominantly in posterior head regions, symmetric and reactive to eye opening and eye closing. Sleep was characterized by vertex waves, sleep spindles (12 to 14 Hz), maximal frontocentral region. Hyperventilation and photic stimulation were not performed.     IMPRESSION: This study is within normal limits. No seizures or epileptiform discharges were seen throughout the recording.  Ellie Spickler 

## 2021-05-20 NOTE — ED Notes (Signed)
Breakfast order placed ?

## 2021-05-20 NOTE — Plan of Care (Signed)
  Problem: Education: Goal: Expressions of having a comfortable level of knowledge regarding the disease process will increase Outcome: Progressing   Problem: Coping: Goal: Ability to adjust to condition or change in health will improve Outcome: Progressing Goal: Ability to identify appropriate support needs will improve Outcome: Progressing   Problem: Health Behavior/Discharge Planning: Goal: Compliance with prescribed medication regimen will improve Outcome: Progressing   Problem: Medication: Goal: Risk for medication side effects will decrease Outcome: Progressing   Problem: Clinical Measurements: Goal: Complications related to the disease process, condition or treatment will be avoided or minimized Outcome: Progressing Goal: Diagnostic test results will improve Outcome: Progressing   Problem: Ischemic Stroke/TIA Tissue Perfusion: Goal: Complications of ischemic stroke/TIA will be minimized Outcome: Progressing   Problem: Self-Care: Goal: Ability to participate in self-care as condition permits will improve Outcome: Progressing

## 2021-05-21 LAB — CBC
HCT: 38.1 % (ref 36.0–46.0)
Hemoglobin: 12.9 g/dL (ref 12.0–15.0)
MCH: 29.5 pg (ref 26.0–34.0)
MCHC: 33.9 g/dL (ref 30.0–36.0)
MCV: 87.2 fL (ref 80.0–100.0)
Platelets: 366 10*3/uL (ref 150–400)
RBC: 4.37 MIL/uL (ref 3.87–5.11)
RDW: 15.4 % (ref 11.5–15.5)
WBC: 13 10*3/uL — ABNORMAL HIGH (ref 4.0–10.5)
nRBC: 0 % (ref 0.0–0.2)

## 2021-05-21 LAB — TYPE AND SCREEN
ABO/RH(D): O POS
Antibody Screen: NEGATIVE

## 2021-05-21 MED ORDER — LIDOCAINE HCL (PF) 1 % IJ SOLN
30.0000 mL | INTRAMUSCULAR | Status: DC | PRN
  Filled 2021-05-21: qty 30

## 2021-05-21 MED ORDER — ZOLPIDEM TARTRATE 5 MG PO TABS
5.0000 mg | ORAL_TABLET | Freq: Every evening | ORAL | Status: DC | PRN
  Administered 2021-05-21: 5 mg via ORAL
  Filled 2021-05-21: qty 1

## 2021-05-21 MED ORDER — TERBUTALINE SULFATE 1 MG/ML IJ SOLN
0.2500 mg | Freq: Once | INTRAMUSCULAR | Status: AC | PRN
  Administered 2021-05-22: 0.25 mg via SUBCUTANEOUS
  Filled 2021-05-21 (×2): qty 1

## 2021-05-21 MED ORDER — OXYCODONE-ACETAMINOPHEN 5-325 MG PO TABS: 1.0000 | ORAL_TABLET | ORAL | Status: DC | PRN

## 2021-05-21 MED ORDER — ACETAMINOPHEN 325 MG PO TABS: 650.0000 mg | ORAL_TABLET | ORAL | Status: DC | PRN

## 2021-05-21 MED ORDER — BUTORPHANOL TARTRATE 1 MG/ML IJ SOLN
1.0000 mg | INTRAMUSCULAR | Status: DC | PRN
  Administered 2021-05-22 (×2): 1 mg via INTRAVENOUS
  Filled 2021-05-21 (×2): qty 1

## 2021-05-21 MED ORDER — SOD CITRATE-CITRIC ACID 500-334 MG/5ML PO SOLN
30.0000 mL | ORAL | Status: DC | PRN
  Administered 2021-05-22: 30 mL via ORAL
  Filled 2021-05-21 (×2): qty 30

## 2021-05-21 MED ORDER — LACTATED RINGERS IV SOLN: 500.0000 mL | INTRAVENOUS | Status: DC | PRN

## 2021-05-21 MED ORDER — MISOPROSTOL 25 MCG QUARTER TABLET
25.0000 ug | ORAL_TABLET | ORAL | Status: DC | PRN
  Administered 2021-05-21 – 2021-05-22 (×2): 25 ug via VAGINAL
  Filled 2021-05-21 (×3): qty 1

## 2021-05-21 MED ORDER — VANCOMYCIN HCL IN DEXTROSE 1-5 GM/200ML-% IV SOLN
1000.0000 mg | Freq: Two times a day (BID) | INTRAVENOUS | Status: DC
  Administered 2021-05-22: 1000 mg via INTRAVENOUS
  Filled 2021-05-21 (×2): qty 200

## 2021-05-21 MED ORDER — ONDANSETRON HCL 4 MG/2ML IJ SOLN: 4.0000 mg | Freq: Four times a day (QID) | INTRAMUSCULAR | Status: DC | PRN

## 2021-05-21 MED ORDER — OXYTOCIN-SODIUM CHLORIDE 30-0.9 UT/500ML-% IV SOLN
2.5000 [IU]/h | INTRAVENOUS | Status: DC
  Filled 2021-05-21: qty 500

## 2021-05-21 MED ORDER — LACTATED RINGERS IV SOLN: INTRAVENOUS | Status: DC

## 2021-05-21 MED ORDER — DOCUSATE SODIUM 100 MG PO CAPS: 100.0000 mg | ORAL_CAPSULE | Freq: Every day | ORAL | Status: DC | PRN

## 2021-05-21 MED ORDER — OXYCODONE-ACETAMINOPHEN 5-325 MG PO TABS: 2.0000 | ORAL_TABLET | ORAL | Status: DC | PRN

## 2021-05-21 MED ORDER — OXYTOCIN BOLUS FROM INFUSION: 333.0000 mL | Freq: Once | INTRAVENOUS | Status: DC

## 2021-05-21 NOTE — Progress Notes (Signed)
Monitors applied for NST.  Patient reports good fetal movement.  No leaking or bleeding.  Is aware of POC for start of induction tonight at [facility].  Last dose of heparin was 12/1 at 10am.

## 2021-05-21 NOTE — Progress Notes (Signed)
FPTS Brief Progress Note  S: Patient sleeping comfortably.  O: BP 97/61 (BP Location: Left Arm)   Pulse 98   Temp 98.6 F (37 C) (Oral)   Resp 18   Ht 5\' 2"  (1.575 m)   Wt 103.4 kg   LMP 08/22/2020   SpO2 98%   BMI 41.70 kg/m   General: NAD, sleeping comfortably  A/P: Vitals unremarkable. Continue plan per day team. - Orders reviewed. Labs for AM ordered, which was adjusted as needed.   10/22/2020, DO 05/21/2021, 1:31 AM PGY-1, [facility] Night Resident  Please page [phone] with questions.

## 2021-05-21 NOTE — Discharge Summary (Addendum)
Family Medicine Teaching [facility] Transition Summary  Patient name: Christine Wong Medical record number: [id number] Date of birth: 11-10-1985 Age: 35 y.o. Gender: female Date of Admission: 05/19/2021  Date of Transition to L&D: 05/21/2021 Admitting Physician: Katha Cabal, DO  Primary Care Provider: Pcp, No Consultants: Neurology and OB  Indication for Hospitalization: Stroke Rule Out   Transition Diagnoses/Problem List:  Principal Problem:   Stroke-like symptoms Active Problems:   CVA (cerebral vascular accident) (HCC)   Bell's palsy   Numbness   Slurred speech   [redacted] weeks gestation of pregnancy    Disposition: L&D  Transition Condition: medically stable for transition to L&D   Transition Exam:  Blood pressure 106/79, pulse 97, temperature 98.1 F (36.7 C), temperature source Oral, resp. rate 19, height 5\' 2"  (1.575 m), weight 103.4 kg, last menstrual period 08/22/2020, SpO2 98 %.  General: Patient is well appearing in NAD  Cardio: regular rate and rhythm  Pulm: respiratory effort normal. lungs clear, with no wheezing, crackles, or rhonchi  Abdominal: soft, non-tender, no guarding or palpable masses  Extremities: no peripheral edema Skin: no rashes or lacerations  Neuro: alert and oriented x 4, no focal deficits, no facial droop  Brief Hospital Course:  Christine Wong is a 35 y.o. female who was admitted to [facility] on 11/29 for left-sided facial droop, weakness and dysarthria concerning for TIA vs CVA. Her hospital course is outlined below. Refer to the H&P for additional information.   Complex Migraine  Bells Palsy  Code stroke activated in the ED. CT head was negative, ASPECTS 10/10. MRI head and MRA head and neck were also reassuring. CBC and CMP were unremarkable. Neurology consulted and patient underwent further work up with EEG showing no seizures for epileptiform discharges. LUE venous doppler U/S was obtained due to concerns of unilateral  swelling and erythema and was negative. Given reassuring labs and imaging studies and patient's unilateral left facial droop, it was thought that patients symptoms were caused from Bell's Palsy. Neurology recommended a 7 day course of valacyclovir and prednisone. OBGYN was consulted for management of pregnancy, and fetal heart tones remained reassuring throughout hospitalization. Patient will be transferred to L&D due to neurology medical clearance for induction of labor. Patient is medically stable for transfer.   Elevated Blood Pressures She had multiple elevated BP readings >140/90, concerning for gestational hypertension and Pre-E. CMP without any abnormalities. She was started on PO Labetalol 100 mg BID with improvement in pressures.  Uric acid and UPC was normal. OB recommends close monitoring of patient's blood pressures.    PCP Follow Up  Follow up with OBGYN  Follow up with Hematologist  Follow up elevated blood pressures Continue prednisone x 7 days - end date 05/28/2021 Continue valcyclovir 1,000 mg  x 7 days - end date 05/27/2021  Significant Procedures:   EEG 05/20/2021 IMPRESSION: This study is within normal limits. No seizures or epileptiform discharges were seen throughout the recording.  Significant Labs and Imaging:  Recent Labs  Lab 05/19/21 1558 05/19/21 1610  WBC 10.3  --   HGB 13.8 13.9  HCT 41.7 41.0  PLT 362  --    Recent Labs  Lab 05/19/21 1558 05/19/21 1610  NA 134* 134*  K 4.6 4.5  CL 103 105  CO2 23  --   GLUCOSE 69* 70  BUN 6 5*  CREATININE 0.66 0.60  CALCIUM 10.0  --   ALKPHOS 239*  --   AST 16  --  ALT 13  --   ALBUMIN 2.9*  --    CT Head w/o contrast 05/19/2021 IMPRESSION: No evidence of acute intracranial abnormality. ASPECTS is 10.  MRI Angio Head, Brain and Angio Neck w/o Contrast 05/19/2021 IMPRESSION: Negative head MRI, head MRA, and neck MRA.  Results/Tests Pending at Time of Transition: none   Follow-Up Appointments:   Follow-up Information     [facility]. Schedule an appointment as soon as possible for a visit in 1 month(s).   Specialty: Neurology Contact information: [address] [place] [address] [phone]                Glendale Chard, Medical Student 05/21/2021, 12:58 PM OMS4, [facility]   Resident Attestation  I saw and evaluated the patient, performing the key elements of the service.I  personally performed or re-performed the history, physical exam, and medical decision making activities of this service and have verified that the service and findings are accurately documented in the student's note. I developed the management plan that is described in the medical student's note, and I agree with the content, with my edits above.    Derrel Nip, PGY3

## 2021-05-21 NOTE — H&P (Signed)
Christine Wong is a 35 y.o. female presenting for induction of labor due to gestational hypertension.  Also, recently admitted for left facial bell's palsy.  Neurological evaluation otherwise normal.  She does have a hx of Factor V heterozygote mutation with previous LLE VTE and PE.  Has been therapeutically anticoagulated with her last dose of heparin BID 10K U 1000am today  Pre pregnancy on xarelto.  Will need PP and long term anticoagulation. Several elevated BPs at hospital meet the diagnosis of gestational HTN and normal labs except mildly elevated Pr/Cr ratio.  Pregnancy otherwise uncomplicated.  GBS + and resistant to Clindamycin (and she's PCN allergic). OB History     Gravida  3   Para      Term      Preterm      AB  2   Living  0      SAB  2   IAB      Ectopic      Multiple      Live Births  0          Past Medical History:  Diagnosis Date   Anxiety    DVT (deep venous thrombosis) (HCC) 08/2019   HPV (human papilloma virus) infection    Seasonal allergies    Vaginal Pap smear, abnormal    Past Surgical History:  Procedure Laterality Date   WISDOM TOOTH EXTRACTION  2014   Family History: family history includes Breast cancer in her mother and paternal grandmother; Colon cancer in her maternal uncle; Diabetes in her father; Heart disease in her father; Hypertension in her father, maternal grandmother, and mother; Kidney failure in her father; Ovarian cancer in her paternal grandmother; Pancreatic cancer in her maternal grandmother. Social History:  reports that she quit smoking about 13 years ago. Her smoking use included cigarettes. She has never used smokeless tobacco. She reports current alcohol use. She reports current drug use. Drug: Marijuana.     Maternal Diabetes: No Genetic Screening: Normal Maternal Ultrasounds/Referrals: Normal Fetal Ultrasounds or other Referrals:  Referred to Materal Fetal Medicine  Maternal Substance Abuse:  No Significant  Maternal Medications:  Meds include: Other: Heparin/lovenox Significant Maternal Lab Results:  Group B Strep positive Other Comments:   see HPI  Review of Systems History   Blood pressure 127/82, pulse 99, temperature 97.9 F (36.6 C), temperature source Oral, resp. rate 20, height 5\' 2"  (1.575 m), weight 103.4 kg, last menstrual period 08/22/2020, SpO2 98 %. Exam Physical Exam  Vitals and nursing note reviewed. Exam conducted with a chaperone present.  Constitutional:      Appearance: Normal appearance.  HENT:     Head: Normocephalic.  Eyes:     Pupils: Pupils are equal, round, and reactive to light.  Cardiovascular:     Rate and Rhythm: Normal rate and regular rhythm.     Pulses: Normal pulses.  Abdominal:     General: Abdomen is Gravid, nontender Neurological:     Mental Status: She is alert.  DTRs 1/4 no clonus Prenatal labs: ABO, Rh:   Antibody:   Rubella:   RPR:    HBsAg:    HIV: Non Reactive (11/29 2047)  GBS:   + and clinda resistant  Assessment/Plan: IUP at  38 6/7 weeks Mild Gestational HTN - no severe features.  BPs stable off labetalol Plan IOL with cytotec and pitocin/AROM when able GBS + will use vancomycin in active labor and start before AROM Bells Palsy - continue with prednisone and valtrex  per Neurology recommendation Thrombophillia - now off therapeutic heparin (last dose 10am today).  Restart Lovenox therapeutically PP   Christine Wong 05/21/2021, 5:59 PM

## 2021-05-21 NOTE — Progress Notes (Addendum)
Family Medicine Teaching Service Daily Progress Note Intern Pager: [phone]  Patient name: Christine Wong Medical record number: [id number] Date of birth: 15-Feb-1986 Age: 35 y.o. Gender: female  Primary Care Provider: Pcp, No Consultants: OB and Neurology  Code Status: FULL  Pt Overview and Major Events to Date:  11/29 Admitted to FPTS, Neurology and [facility] Consulted  11/30 Neurology s/o  12/2 IOL   Assessment and Plan: Juna Caban is a 35 y.o. G26P0020 female [redacted]w[redacted]d presenting with left sided facial droop, dysarthria and left-sided weakness concerning for TIA vs CVA. 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   Left Bells Palsy  Complicated Migraine  Patient's reports her facial droop has improved from yesterday. She is reporting occasional eye dryness and tearing. She denies HA, CP, and SOB. She denies increased swelling in her arms and legs.   - per neurology recommendations, prednisone 60 mg x 7 days, and acyclovir 1 g TID x 7 days for Bells Palsy  Hx DVT, PE  Heterozygous Factor V Leiden (actively on Heparin) Stable. Patient denies unilateral peripheral swelling, SOB or CP.  - Last dose of heparin this morning to prep for labor induction on 12/2  Active pregnancy at [redacted]w[redacted]d, G3P0020  Concern for developing gHTN Baby is moving well, intermittent cramping unchanged in intensity, no vaginal bleeding.  - OB following, plan to induce tomorrow 12/2   FEN/GI: regular diet PPx: subcutaneous hepatin 10,000 U BID  Dispo:L&D  Subjective:  Patient is doing well this morning and has no new complaints. Her left facial droop is improving. She denies SOB, CP, increased peripheral edema, and HA. She has felt baby move well throughout the night, continues to have intermittent cramping, but denies vaginal bleeding.   Objective: Temp:  [97.7 F (36.5 C)-98.6 F (37 C)] 98.3 F (36.8 C) (12/01  0405) Pulse Rate:  [85-106] 85 (12/01 0405) Resp:  [14-21] 18 (12/01 0405) BP: (97-132)/(61-89) 107/75 (12/01 0405) SpO2:  [97 %-98 %] 97 % (12/01 0405) Physical Exam: General: well appearing in NAD  Cardiovascular: regular rate and rhythm, no murmurs appreciated  Respiratory: clear, no wheezing, rhonchi or crackles  Abdomen: gravid, non-tender  Extremities: mild bilateral peripheral LE edema.  Neuro: left facial droop moderately improved from 11/30. Strength 5/5 bilateral UE and LE.  Laboratory: Recent Labs  Lab 05/19/21 1558 05/19/21 1610  WBC 10.3  --   HGB 13.8 13.9  HCT 41.7 41.0  PLT 362  --    Recent Labs  Lab 05/19/21 1558 05/19/21 1610  NA 134* 134*  K 4.6 4.5  CL 103 105  CO2 23  --   BUN 6 5*  CREATININE 0.66 0.60  CALCIUM 10.0  --   PROT 6.7  --   BILITOT 0.3  --   ALKPHOS 239*  --   ALT 13  --   AST 16  --   GLUCOSE 69* 70     Imaging/Diagnostic Tests:  EEG 11/30  IMPRESSION: This study is within normal limits. No seizures or epileptiform discharges were seen throughout the recording.    Glendale Chard, Medical Student 05/21/2021, 6:44 AM [facility] Family Medicine FPTS Intern pager: [phone], text pages welcome  Resident Attestation  I saw and evaluated the patient, performing the key elements of the service.I  personally performed or re-performed the history, physical exam, and medical decision making activities of this service and have verified that the service and  findings are accurately documented in the student's note. I developed the management plan that is described in the medical student's note, and I agree with the content, with my edits above.    Derrel Nip, PGY3

## 2021-05-21 NOTE — Progress Notes (Signed)
NST reactive and reassuring for 38 6/[redacted] weeks gestation.

## 2021-05-21 NOTE — Progress Notes (Signed)
Patient ID: Christine Wong, female   DOB: 1985/09/02, 35 y.o.   MRN: [id number] Christine Wong feels well except concerned about her Bells palsy sxs Reports GFM, no ctxs, ROM or vaginal bleeding VS 05/21/21 0839 97.9 F (36.6 C) 87 89 20 127/83 Lying 98 % -- -- -- -- LN  05/21/21 0405 98.3 F (36.8 C) 85 -- 18 107/75 Lying 97 % Room Air -- -- -- PO   Abd Gravid nt Ext trace edema and DTRs 1/4  Labs no significant labs abnormalities.  Pr/Cr slightly elevated  IUP at 38 6/7 Bell's palsy - on acyclovir/steroids.  No evidence of stroke or VTE Factor V heterozygote mutation/hx of PE/VTE - will d/c anticoagulants after this mornings dose in anticipation of induction of labor beginning tonight or tomorrow (based on bed availability in L&D).  Place SCDs once off heparin Discussed IOL with Christine Wong and her questions were answered Labs and BPs may support diagnosis of mild gestational HTN without severe features.  No neurological sxs or severe features that would warrant Magnesium at this time.  Plan IOL for delivery. DL

## 2021-05-21 NOTE — Plan of Care (Signed)
  Problem: Coping: Goal: Ability to adjust to condition or change in health will improve Outcome: Progressing Goal: Ability to identify appropriate support needs will improve Outcome: Progressing   Problem: Clinical Measurements: Goal: Complications related to the disease process, condition or treatment will be avoided or minimized Outcome: Progressing Goal: Diagnostic test results will improve Outcome: Progressing   Problem: Ischemic Stroke/TIA Tissue Perfusion: Goal: Complications of ischemic stroke/TIA will be minimized Outcome: Progressing

## 2021-05-21 NOTE — Evaluation (Signed)
Speech Language Pathology Evaluation Patient Details Name: Christine Wong MRN: [id number] DOB: 1986/06/07 Today's Date: 05/21/2021 Time: 1358-1410 SLP Time Calculation (min) (ACUTE ONLY): 12 min  Problem List:  Patient Active Problem List   Diagnosis Date Noted   CVA (cerebral vascular accident) (HCC) 05/20/2021   Bell's palsy    Numbness    Slurred speech    [redacted] weeks gestation of pregnancy    Stroke-like symptoms 05/19/2021   Stroke-like symptom    History of deep vein thrombosis (DVT) of lower extremity    History of pulmonary embolism    DVT (deep venous thrombosis) (HCC) 05/07/2021   Pulmonary embolism (HCC) 05/07/2021   Factor V Leiden (HCC) 05/07/2021   Past Medical History:  Past Medical History:  Diagnosis Date   Anxiety    DVT (deep venous thrombosis) (HCC) 08/2019   HPV (human papilloma virus) infection    Seasonal allergies    Vaginal Pap smear, abnormal    Past Surgical History:  Past Surgical History:  Procedure Laterality Date   WISDOM TOOTH EXTRACTION  2014   HPI:      Assessment / Plan / Recommendation Clinical Impression  Pt presents with left LMN CN VII impairment secondary to Bell's Palsy.  Speech is intelligible and clear excluding occasional imprecision of labial-based consonants.  Pt reported some anterior liquid bolus loss and needing to chew items on the right side of her mouth.  Reiterated basic information about Bell's Palsy and its impact; recovery.  We discussed the benefit of continuing to use the left side of the mouth when eating/drinking to engage the sensorimotor circuitry as much as possible.  No further SLP f/u is needed. Pt will f/u with neurology at some point after her delivery.  She verbalized understanding.    SLP Assessment  SLP Recommendation/Assessment: Patient does not need any further Speech Lanaguage Pathology Services SLP Visit Diagnosis: Dysarthria and anarthria (R47.1)    Recommendations for follow up therapy are one  component of a multi-disciplinary discharge planning process, led by the attending physician.  Recommendations may be updated based on patient status, additional functional criteria and insurance authorization.    Follow Up Recommendations  No SLP follow up                       SLP Evaluation Cognition  Overall Cognitive Status: Within Functional Limits for tasks assessed Orientation Level: Oriented X4       Comprehension  Auditory Comprehension Overall Auditory Comprehension: Appears within functional limits for tasks assessed    Expression Expression Primary Mode of Expression: Verbal Verbal Expression Overall Verbal Expression: Appears within functional limits for tasks assessed   Oral / Motor  Oral Motor/Sensory Function Overall Oral Motor/Sensory Function: Moderate impairment Facial ROM: Reduced left;Suspected CN VII (facial) dysfunction Facial Symmetry: Suspected CN VII (facial) dysfunction;Abnormal symmetry left Facial Strength: Suspected CN VII (facial) dysfunction;Reduced left Facial Sensation: Within Functional Limits Lingual Symmetry: Within Functional Limits Lingual Strength: Within Functional Limits Motor Speech Overall Motor Speech: Appears within functional limits for tasks assessed   GO                   Christine Wong L. Christine Frederic, MA CCC/SLP Acute Rehabilitation Services Office number [phone] Pager [phone]  Christine Wong Christine Wong 05/21/2021, 2:20 PM

## 2021-05-22 ENCOUNTER — Inpatient Hospital Stay (HOSPITAL_COMMUNITY): Payer: Medicaid Other | Admitting: Anesthesiology

## 2021-05-22 ENCOUNTER — Encounter (HOSPITAL_COMMUNITY)
Admission: AD | Disposition: A | Discharge status: Home / Self Care | Payer: Self-pay | Source: Home / Self Care | Attending: Obstetrics & Gynecology

## 2021-05-22 ENCOUNTER — Encounter (HOSPITAL_COMMUNITY): Payer: Self-pay | Admitting: Obstetrics and Gynecology

## 2021-05-22 LAB — CBC
HCT: 41.6 % (ref 36.0–46.0)
Hemoglobin: 13.7 g/dL (ref 12.0–15.0)
MCH: 29 pg (ref 26.0–34.0)
MCHC: 32.9 g/dL (ref 30.0–36.0)
MCV: 87.9 fL (ref 80.0–100.0)
Platelets: 390 10*3/uL (ref 150–400)
RBC: 4.73 MIL/uL (ref 3.87–5.11)
RDW: 15.9 % — ABNORMAL HIGH (ref 11.5–15.5)
WBC: 14.1 10*3/uL — ABNORMAL HIGH (ref 4.0–10.5)
nRBC: 0 % (ref 0.0–0.2)

## 2021-05-22 LAB — RPR: RPR Ser Ql: NONREACTIVE

## 2021-05-22 SURGERY — Surgical Case
Anesthesia: Epidural

## 2021-05-22 MED ORDER — FENTANYL-BUPIVACAINE-NACL 0.5-0.125-0.9 MG/250ML-% EP SOLN
12.0000 mL/h | EPIDURAL | Status: DC | PRN
  Administered 2021-05-22: 12 mL/h via EPIDURAL
  Filled 2021-05-22: qty 250

## 2021-05-22 MED ORDER — CLINDAMYCIN PHOSPHATE 900 MG/50ML IV SOLN
INTRAVENOUS | Status: AC
  Filled 2021-05-22: qty 50

## 2021-05-22 MED ORDER — LIDOCAINE HCL (PF) 1 % IJ SOLN
INTRAMUSCULAR | Status: DC | PRN
  Administered 2021-05-22 (×2): 5 mL via EPIDURAL

## 2021-05-22 MED ORDER — ONDANSETRON HCL 4 MG/2ML IJ SOLN: 4.0000 mg | Freq: Three times a day (TID) | INTRAMUSCULAR | Status: DC | PRN

## 2021-05-22 MED ORDER — SODIUM CHLORIDE 0.9 % IV SOLN
500.0000 mg | Freq: Once | INTRAVENOUS | Status: AC
  Administered 2021-05-22: 500 mg via INTRAVENOUS

## 2021-05-22 MED ORDER — GENTAMICIN SULFATE 40 MG/ML IJ SOLN
INTRAVENOUS | Status: DC | PRN
  Administered 2021-05-22: 360 mg via INTRAVENOUS

## 2021-05-22 MED ORDER — NALBUPHINE HCL 10 MG/ML IJ SOLN: 5.0000 mg | INTRAMUSCULAR | Status: DC | PRN

## 2021-05-22 MED ORDER — LACTATED RINGERS IV SOLN
500.0000 mL | Freq: Once | INTRAVENOUS | Status: AC
  Administered 2021-05-22: 500 mL via INTRAVENOUS

## 2021-05-22 MED ORDER — NALBUPHINE HCL 10 MG/ML IJ SOLN: 5.0000 mg | Freq: Once | INTRAMUSCULAR | Status: DC | PRN

## 2021-05-22 MED ORDER — CLINDAMYCIN PHOSPHATE 900 MG/50ML IV SOLN: 900.0000 mg | Freq: Once | INTRAVENOUS | Status: DC

## 2021-05-22 MED ORDER — EPHEDRINE 5 MG/ML INJ: 10.0000 mg | INTRAVENOUS | Status: DC | PRN

## 2021-05-22 MED ORDER — ONDANSETRON HCL 4 MG/2ML IJ SOLN
INTRAMUSCULAR | Status: DC | PRN
  Administered 2021-05-22: 4 mg via INTRAVENOUS

## 2021-05-22 MED ORDER — KETOROLAC TROMETHAMINE 30 MG/ML IJ SOLN: 30.0000 mg | Freq: Four times a day (QID) | INTRAMUSCULAR | Status: DC | PRN

## 2021-05-22 MED ORDER — LACTATED RINGERS IV SOLN: 500.0000 mL | Freq: Once | INTRAVENOUS | Status: DC

## 2021-05-22 MED ORDER — MORPHINE SULFATE (PF) 0.5 MG/ML IJ SOLN
INTRAMUSCULAR | Status: AC
  Filled 2021-05-22: qty 10

## 2021-05-22 MED ORDER — SODIUM CHLORIDE 0.9 % IV SOLN
INTRAVENOUS | Status: AC
  Filled 2021-05-22: qty 500

## 2021-05-22 MED ORDER — OXYTOCIN-SODIUM CHLORIDE 30-0.9 UT/500ML-% IV SOLN
INTRAVENOUS | Status: AC
  Filled 2021-05-22: qty 500

## 2021-05-22 MED ORDER — MORPHINE SULFATE (PF) 0.5 MG/ML IJ SOLN
INTRAMUSCULAR | Status: DC | PRN
  Administered 2021-05-22: 3 mg via EPIDURAL

## 2021-05-22 MED ORDER — NALOXONE HCL 0.4 MG/ML IJ SOLN: 0.4000 mg | INTRAMUSCULAR | Status: DC | PRN

## 2021-05-22 MED ORDER — ONDANSETRON HCL 4 MG/2ML IJ SOLN: 4.0000 mg | Freq: Four times a day (QID) | INTRAMUSCULAR | Status: DC | PRN

## 2021-05-22 MED ORDER — MEPERIDINE HCL 25 MG/ML IJ SOLN: 6.2500 mg | INTRAMUSCULAR | Status: DC | PRN

## 2021-05-22 MED ORDER — SODIUM CHLORIDE 0.9% FLUSH: 3.0000 mL | INTRAVENOUS | Status: DC | PRN

## 2021-05-22 MED ORDER — FENTANYL CITRATE (PF) 100 MCG/2ML IJ SOLN
INTRAMUSCULAR | Status: DC | PRN
  Administered 2021-05-22: 100 ug via EPIDURAL

## 2021-05-22 MED ORDER — KETOROLAC TROMETHAMINE 30 MG/ML IJ SOLN
30.0000 mg | Freq: Four times a day (QID) | INTRAMUSCULAR | Status: DC | PRN
  Administered 2021-05-23: 30 mg via INTRAMUSCULAR

## 2021-05-22 MED ORDER — FENTANYL CITRATE (PF) 100 MCG/2ML IJ SOLN
INTRAMUSCULAR | Status: AC
  Filled 2021-05-22: qty 2

## 2021-05-22 MED ORDER — MEPERIDINE HCL 25 MG/ML IJ SOLN
INTRAMUSCULAR | Status: AC
  Filled 2021-05-22: qty 1

## 2021-05-22 MED ORDER — GENTAMICIN SULFATE 40 MG/ML IJ SOLN
5.0000 mg/kg | INTRAVENOUS | Status: DC
  Filled 2021-05-22 (×2): qty 9

## 2021-05-22 MED ORDER — CLINDAMYCIN PHOSPHATE 900 MG/50ML IV SOLN
INTRAVENOUS | Status: DC | PRN
  Administered 2021-05-22: 900 mg via INTRAVENOUS

## 2021-05-22 MED ORDER — DEXAMETHASONE SODIUM PHOSPHATE 4 MG/ML IJ SOLN
INTRAMUSCULAR | Status: AC
  Filled 2021-05-22: qty 1

## 2021-05-22 MED ORDER — LABETALOL HCL 200 MG PO TABS
200.0000 mg | ORAL_TABLET | Freq: Two times a day (BID) | ORAL | Status: DC
  Administered 2021-05-22: 200 mg via ORAL
  Filled 2021-05-22: qty 1

## 2021-05-22 MED ORDER — SODIUM CHLORIDE 0.9 % IR SOLN
Status: DC | PRN
  Administered 2021-05-22 (×2): 1

## 2021-05-22 MED ORDER — MORPHINE SULFATE (PF) 0.5 MG/ML IJ SOLN
INTRAMUSCULAR | Status: DC | PRN
  Administered 2021-05-22: .5 mg via INTRAVENOUS

## 2021-05-22 MED ORDER — NALOXONE HCL 4 MG/10ML IJ SOLN
1.0000 ug/kg/h | INTRAVENOUS | Status: DC | PRN
  Filled 2021-05-22: qty 5

## 2021-05-22 MED ORDER — ARTIFICIAL TEARS OPHTHALMIC OINT
TOPICAL_OINTMENT | OPHTHALMIC | Status: DC | PRN
  Filled 2021-05-22: qty 3.5

## 2021-05-22 MED ORDER — ONDANSETRON HCL 4 MG/2ML IJ SOLN
INTRAMUSCULAR | Status: AC
  Filled 2021-05-22: qty 2

## 2021-05-22 MED ORDER — FENTANYL CITRATE (PF) 100 MCG/2ML IJ SOLN: 25.0000 ug | INTRAMUSCULAR | Status: DC | PRN

## 2021-05-22 MED ORDER — OXYCODONE HCL 5 MG/5ML PO SOLN: 5.0000 mg | Freq: Once | ORAL | Status: DC | PRN

## 2021-05-22 MED ORDER — MISOPROSTOL 50MCG HALF TABLET
50.0000 ug | ORAL_TABLET | ORAL | Status: DC | PRN
  Administered 2021-05-22: 50 ug via BUCCAL
  Filled 2021-05-22: qty 1

## 2021-05-22 MED ORDER — DEXAMETHASONE SODIUM PHOSPHATE 4 MG/ML IJ SOLN
INTRAMUSCULAR | Status: DC | PRN
  Administered 2021-05-22: 4 mg via INTRAVENOUS

## 2021-05-22 MED ORDER — MISOPROSTOL 50MCG HALF TABLET
ORAL_TABLET | ORAL | Status: AC
  Filled 2021-05-22: qty 1

## 2021-05-22 MED ORDER — DIPHENHYDRAMINE HCL 25 MG PO CAPS: 25.0000 mg | ORAL_CAPSULE | ORAL | Status: DC | PRN

## 2021-05-22 MED ORDER — OXYTOCIN-SODIUM CHLORIDE 30-0.9 UT/500ML-% IV SOLN
INTRAVENOUS | Status: DC | PRN
  Administered 2021-05-22: 30 [IU] via INTRAVENOUS

## 2021-05-22 MED ORDER — DIPHENHYDRAMINE HCL 50 MG/ML IJ SOLN: 12.5000 mg | INTRAMUSCULAR | Status: DC | PRN

## 2021-05-22 MED ORDER — OXYCODONE HCL 5 MG PO TABS: 5.0000 mg | ORAL_TABLET | Freq: Once | ORAL | Status: DC | PRN

## 2021-05-22 MED ORDER — SCOPOLAMINE 1 MG/3DAYS TD PT72
1.0000 | MEDICATED_PATCH | Freq: Once | TRANSDERMAL | Status: DC
  Administered 2021-05-23: 1.5 mg via TRANSDERMAL

## 2021-05-22 MED ORDER — PHENYLEPHRINE 40 MCG/ML (10ML) SYRINGE FOR IV PUSH (FOR BLOOD PRESSURE SUPPORT)
80.0000 ug | PREFILLED_SYRINGE | INTRAVENOUS | Status: DC | PRN
  Filled 2021-05-22: qty 10

## 2021-05-22 MED ORDER — PHENYLEPHRINE 40 MCG/ML (10ML) SYRINGE FOR IV PUSH (FOR BLOOD PRESSURE SUPPORT): 80.0000 ug | PREFILLED_SYRINGE | INTRAVENOUS | Status: DC | PRN

## 2021-05-22 MED ORDER — DIPHENHYDRAMINE HCL 25 MG PO CAPS
50.0000 mg | ORAL_CAPSULE | Freq: Four times a day (QID) | ORAL | Status: DC | PRN
  Administered 2021-05-22: 50 mg via ORAL
  Filled 2021-05-22: qty 2

## 2021-05-22 SURGICAL SUPPLY — 33 items
BENZOIN TINCTURE PRP APPL 2/3 (GAUZE/BANDAGES/DRESSINGS) ×2 IMPLANT
CHLORAPREP W/TINT 26ML (MISCELLANEOUS) ×2 IMPLANT
CLAMP CORD UMBIL (MISCELLANEOUS) IMPLANT
CLOTH BEACON ORANGE TIMEOUT ST (SAFETY) ×2 IMPLANT
DERMABOND ADVANCED (GAUZE/BANDAGES/DRESSINGS)
DERMABOND ADVANCED .7 DNX12 (GAUZE/BANDAGES/DRESSINGS) IMPLANT
DRSG OPSITE POSTOP 4X10 (GAUZE/BANDAGES/DRESSINGS) ×2 IMPLANT
ELECT REM PT RETURN 9FT ADLT (ELECTROSURGICAL) ×2
ELECTRODE REM PT RTRN 9FT ADLT (ELECTROSURGICAL) ×1 IMPLANT
EXTRACTOR VACUUM KIWI (MISCELLANEOUS) IMPLANT
GLOVE BIO SURGEON STRL SZ 6 (GLOVE) ×2 IMPLANT
GLOVE BIOGEL PI IND STRL 6 (GLOVE) ×2 IMPLANT
GLOVE BIOGEL PI IND STRL 7.0 (GLOVE) ×1 IMPLANT
GLOVE BIOGEL PI INDICATOR 6 (GLOVE) ×2
GLOVE BIOGEL PI INDICATOR 7.0 (GLOVE) ×1
GOWN STRL REUS W/TWL LRG LVL3 (GOWN DISPOSABLE) ×4 IMPLANT
KIT ABG SYR 3ML LUER SLIP (SYRINGE) ×2 IMPLANT
MAT PREVALON FULL STRYKER (MISCELLANEOUS) ×2 IMPLANT
NEEDLE HYPO 25X5/8 SAFETYGLIDE (NEEDLE) ×2 IMPLANT
NS IRRIG 1000ML POUR BTL (IV SOLUTION) ×2 IMPLANT
PACK C SECTION WH (CUSTOM PROCEDURE TRAY) ×2 IMPLANT
PAD OB MATERNITY 4.3X12.25 (PERSONAL CARE ITEMS) ×2 IMPLANT
PENCIL SMOKE EVAC W/HOLSTER (ELECTROSURGICAL) IMPLANT
STRIP CLOSURE SKIN 1/2X4 (GAUZE/BANDAGES/DRESSINGS) ×2 IMPLANT
SUT CHROMIC 0 CTX 36 (SUTURE) ×6 IMPLANT
SUT MON AB 2-0 CT1 27 (SUTURE) ×2 IMPLANT
SUT PDS AB 0 CT1 27 (SUTURE) IMPLANT
SUT PLAIN 0 NONE (SUTURE) IMPLANT
SUT VIC AB 0 CT1 36 (SUTURE) ×2 IMPLANT
SUT VIC AB 4-0 KS 27 (SUTURE) IMPLANT
TOWEL OR 17X24 6PK STRL BLUE (TOWEL DISPOSABLE) ×2 IMPLANT
TRAY FOLEY W/BAG SLVR 14FR LF (SET/KITS/TRAYS/PACK) IMPLANT
WATER STERILE IRR 1000ML POUR (IV SOLUTION) ×2 IMPLANT

## 2021-05-22 NOTE — Progress Notes (Signed)
Christine Wong is a 35 y.o. G3P0020 at [redacted]w[redacted]d by ultrasound admitted for induction of labor due to [facility].  Subjective: Feeling more intensity with CTX in lower abdomen and back.  No HA, vision change, RUQ pain, CP/SOB.  Active FM.  Objective: BP (!) 145/97   Pulse 89   Temp 97.7 F (36.5 C) (Oral)   Resp 16   Ht 5\' 2"  (1.575 m)   Wt 103.4 kg   LMP 08/22/2020   SpO2 96%   BMI 41.70 kg/m  I/O last 3 completed shifts: In: 725 [P.O.:725] Out: -  No intake/output data recorded.  FHT:  FHR: 135 bpm, variability: moderate,  accelerations:  Present,  decelerations:  Absent UC:   regular, every 2 minutes SVE:   Dilation: Closed Effacement (%): Thick Station: -3 Exam by:: Dr. [email or web address]  Labs: Lab Results  Component Value Date   WBC 13.0 (H) 05/21/2021   HGB 12.9 05/21/2021   HCT 38.1 05/21/2021   MCV 87.2 05/21/2021   PLT 366 05/21/2021    Assessment / Plan: Induction of labor for GHTN; s/p VMP x 2 with last dose at 0322.  Labor:  CTX too frequent for third dose of VMP at this time.  Patient would benefit from more cervical ripening.  Patient strongly desires shower and to eat.  Will allow a meal and shower and reassess CTX following that time.   Will start vancomycin for GBS positive. Preeclampsia:   n/a.  GHTN with mild range BPs and asymptomatic.  Will continue to monitor and start magnesium for severe range BPs or symptoms. Fetal Wellbeing:  Category I Pain Control:  Labor support without medications I/D:  n/a Anticipated MOD:  NSVD Bells Palsy: continue Valtrex and prednison FVL and h/o DVT/PE: Last UFH dose 10 AM yesterday.  SCDs are on.  Planning therapeutic LMWH postpartum.  14/06/2020 05/22/2021, 10:22 AM

## 2021-05-22 NOTE — Progress Notes (Signed)
Christine Wong is a 35 y.o. G3P0020 at [redacted]w[redacted]d by ultrasound admitted for induction of labor due to [facility].  Subjective: Feeling more intense CTX.  Received one dose of Stadol.  Objective: BP (!) 139/94   Pulse 91   Temp 98.1 F (36.7 C) (Oral)   Resp 16   Ht 5\' 2"  (1.575 m)   Wt 103.4 kg   LMP 08/22/2020   SpO2 96%   BMI 41.70 kg/m  I/O last 3 completed shifts: In: 725 [P.O.:725] Out: -  No intake/output data recorded.  FHT:  FHR: 130 bpm, variability: moderate,  accelerations:  Present,  decelerations:  Absent UC:   irregular, every 1-4 minutes SVE:   Dilation: 1 Effacement (%): 20 Station: -3 Exam by:: Dr. [email or web address]  Labs: Lab Results  Component Value Date   WBC 13.0 (H) 05/21/2021   HGB 12.9 05/21/2021   HCT 38.1 05/21/2021   MCV 87.2 05/21/2021   PLT 366 05/21/2021    Assessment / Plan: Induction of labor for GHTN  Labor: Progressing normally and s/p 4th dose of cytotec (given now) Preeclampsia:   n/a Fetal Wellbeing:  Category I Pain Control:  Labor support without medications I/D:  n/a Anticipated MOD:  NSVD GHTN: Labetalol 200 BID restarted with improvement in BPs  Kelson Queenan 05/22/2021, 6:07 PM

## 2021-05-22 NOTE — Anesthesia Preprocedure Evaluation (Signed)
Anesthesia Evaluation  Patient identified by MRN, date of birth, ID band Patient awake    Reviewed: Allergy & Precautions, H&P , NPO status , Patient's Chart, lab work & pertinent test results  Airway Mallampati: II   Neck ROM: full    Dental   Pulmonary former smoker,    breath sounds clear to auscultation       Cardiovascular  Rhythm:regular Rate:Normal  H/o LLE DVT and PE. On Xarelto pre-pregnancy.  Recently on BID SQ heparin (last dose 05/21/21 10am).     Neuro/Psych PSYCHIATRIC DISORDERS Anxiety Recently diagnosed with left facial Bell's palsy  Neuromuscular disease    GI/Hepatic   Endo/Other  Morbid obesity  Renal/GU      Musculoskeletal   Abdominal   Peds  Hematology H/o Factor V Leiden   Anesthesia Other Findings   Reproductive/Obstetrics (+) Pregnancy                             Anesthesia Physical Anesthesia Plan  ASA: 2  Anesthesia Plan: Epidural   Post-op Pain Management:    Induction: Intravenous  PONV Risk Score and Plan: 2 and Treatment may vary due to age or medical condition  Airway Management Planned: Natural Airway  Additional Equipment:   Intra-op Plan:   Post-operative Plan:   Informed Consent: I have reviewed the patients History and Physical, chart, labs and discussed the procedure including the risks, benefits and alternatives for the proposed anesthesia with the patient or authorized representative who has indicated his/her understanding and acceptance.     Dental advisory given  Plan Discussed with: Anesthesiologist  Anesthesia Plan Comments:         Anesthesia Quick Evaluation

## 2021-05-22 NOTE — Anesthesia Procedure Notes (Signed)
Epidural Patient location during procedure: OB Start time: 05/22/2021 7:50 PM End time: 05/22/2021 8:02 PM  Staffing Anesthesiologist: Achille Rich, MD Performed: anesthesiologist   Preanesthetic Checklist Completed: patient identified, IV checked, site marked, risks and benefits discussed, monitors and equipment checked, pre-op evaluation and timeout performed  Epidural Patient position: sitting Prep: DuraPrep Patient monitoring: heart rate, cardiac monitor, continuous pulse ox and blood pressure Approach: midline Location: L2-L3 Injection technique: LOR saline  Needle:  Needle type: Tuohy  Needle gauge: 17 G Needle length: 9 cm Needle insertion depth: 6 cm Catheter type: closed end flexible Catheter size: 19 Gauge Catheter at skin depth: 12 cm Test dose: negative and Other  Assessment Events: blood not aspirated, injection not painful, no injection resistance and negative IV test  Additional Notes Informed consent obtained prior to proceeding including risk of failure, 1% risk of PDPH, risk of minor discomfort and bruising.  Discussed rare but serious complications including epidural abscess, permanent nerve injury, epidural hematoma.  Discussed alternatives to epidural analgesia and patient desires to proceed.  Timeout performed pre-procedure verifying patient name, procedure, and platelet count.  Patient tolerated procedure well. Reason for block:procedure for pain

## 2021-05-22 NOTE — Progress Notes (Signed)
Christine Wong is a 35 y.o. G3P0020 at [redacted]w[redacted]d by ultrasound admitted for induction of labor due to [facility].  Subjective: Continues to feel painful CTX.  Active FM.  No HA, vision change, RUQ pain, CP/SOB.  Objective: BP 118/76   Pulse 97   Temp 98.1 F (36.7 C) (Oral)   Resp 16   Ht 5\' 2"  (1.575 m)   Wt 103.4 kg   LMP 08/22/2020   SpO2 96%   BMI 41.70 kg/m  I/O last 3 completed shifts: In: 725 [P.O.:725] Out: -  No intake/output data recorded.  FHT:  FHR: 135 bpm, variability: moderate,  accelerations:  Present,  decelerations:  Absent UC:   regular, every 2-3 minutes SVE:   Dilation: Closed Effacement (%): Thick Station: -3 Exam by:: Dr. [email or web address]  Labs: Lab Results  Component Value Date   WBC 13.0 (H) 05/21/2021   HGB 12.9 05/21/2021   HCT 38.1 05/21/2021   MCV 87.2 05/21/2021   PLT 366 05/21/2021    Assessment / Plan: IOL for GHTN  Labor:  Just received OMP; s/p VMP x 2 Preeclampsia:   n/a Fetal Wellbeing:  Category I Pain Control:  Labor support without medications I/D:  n/a Anticipated MOD:  NSVD Bells Palsy: continue Valtrex and prednison FVL and h/o DVT/PE: Last UFH dose 10 AM yesterday.  SCDs are on.  Planning therapeutic LMWH postpartum.  14/06/2020 05/22/2021, 2:16 PM

## 2021-05-22 NOTE — Progress Notes (Signed)
I received call from RN at 2056 with report of repetitive late decelerations since CLEA was placed.  RN had tried position changes and fluid bolus.  Patient has not received cytotec since 1404 buccal and it was removed after SROM at 1820 when CTX became very intense.  I ordered terbutaline to be given.  On arrival, patient is comfortable and cvx 1-2 cm.  I assisted RN to reposition patient.  Patient is counseled that if we are unable to correct late decelerations, we will proceed to C/S for non-reassuring fetal monitoring.  Will closely monitor.    Mitchel Honour, DO

## 2021-05-22 NOTE — Progress Notes (Signed)
Patient continues to have repetitive late decelerations despite position changes, fluid bolus, terbutaline.  Recommend C/S for non-reassuring fetal monitoring.  Patient is counseled re: risk of bleeding, infection, scarring and damage to surrounding structures.  She is informed of implications in future pregnancies to include uterine rupture and abnormal placentation.  All questions were answered and patient wishes to proceed.   Mitchel Honour, DO

## 2021-05-22 NOTE — Progress Notes (Signed)
Pt in shower, IV wrapped with education on needing to be back on monitor soon.

## 2021-05-23 ENCOUNTER — Encounter (HOSPITAL_COMMUNITY): Payer: Self-pay | Admitting: Obstetrics & Gynecology

## 2021-05-23 DIAGNOSIS — O139 Gestational [pregnancy-induced] hypertension without significant proteinuria, unspecified trimester: Secondary | ICD-10-CM | POA: Diagnosis present

## 2021-05-23 LAB — CBC
HCT: 36.2 % (ref 36.0–46.0)
HCT: 33.9 % — ABNORMAL LOW (ref 36.0–46.0)
Hemoglobin: 12.1 g/dL (ref 12.0–15.0)
Hemoglobin: 11.4 g/dL — ABNORMAL LOW (ref 12.0–15.0)
MCH: 29.4 pg (ref 26.0–34.0)
MCH: 29.5 pg (ref 26.0–34.0)
MCHC: 33.4 g/dL (ref 30.0–36.0)
MCHC: 33.6 g/dL (ref 30.0–36.0)
MCV: 87.9 fL (ref 80.0–100.0)
MCV: 87.8 fL (ref 80.0–100.0)
Platelets: 295 10*3/uL (ref 150–400)
Platelets: 275 10*3/uL (ref 150–400)
RBC: 4.12 MIL/uL (ref 3.87–5.11)
RBC: 3.86 MIL/uL — ABNORMAL LOW (ref 3.87–5.11)
RDW: 15.9 % — ABNORMAL HIGH (ref 11.5–15.5)
RDW: 15.9 % — ABNORMAL HIGH (ref 11.5–15.5)
WBC: 20 10*3/uL — ABNORMAL HIGH (ref 4.0–10.5)
WBC: 16.7 10*3/uL — ABNORMAL HIGH (ref 4.0–10.5)
nRBC: 0 % (ref 0.0–0.2)
nRBC: 0 % (ref 0.0–0.2)

## 2021-05-23 LAB — COMPREHENSIVE METABOLIC PANEL
ALT: 12 U/L (ref 0–44)
AST: 19 U/L (ref 15–41)
Albumin: 2.3 g/dL — ABNORMAL LOW (ref 3.5–5.0)
Alkaline Phosphatase: 170 U/L — ABNORMAL HIGH (ref 38–126)
Anion gap: 9 (ref 5–15)
BUN: 6 mg/dL (ref 6–20)
CO2: 21 mmol/L — ABNORMAL LOW (ref 22–32)
Calcium: 8.9 mg/dL (ref 8.9–10.3)
Chloride: 107 mmol/L (ref 98–111)
Creatinine, Ser: 0.76 mg/dL (ref 0.44–1.00)
GFR, Estimated: >60 mL/min (ref >60)
Glucose, Bld: 128 mg/dL — ABNORMAL HIGH (ref 70–99)
Potassium: 4 mmol/L (ref 3.5–5.1)
Sodium: 137 mmol/L (ref 135–145)
Total Bilirubin: 0.5 mg/dL (ref 0.3–1.2)
Total Protein: 5.3 g/dL — ABNORMAL LOW (ref 6.5–8.1)

## 2021-05-23 MED ORDER — SCOPOLAMINE 1 MG/3DAYS TD PT72
MEDICATED_PATCH | TRANSDERMAL | Status: AC
  Filled 2021-05-23: qty 1

## 2021-05-23 MED ORDER — ACETAMINOPHEN 500 MG PO TABS
1000.0000 mg | ORAL_TABLET | Freq: Four times a day (QID) | ORAL | Status: DC
  Administered 2021-05-23 – 2021-05-25 (×9): 1000 mg via ORAL
  Filled 2021-05-23 (×12): qty 2

## 2021-05-23 MED ORDER — OXYCODONE HCL 5 MG PO TABS: 5.0000 mg | ORAL_TABLET | ORAL | Status: DC | PRN

## 2021-05-23 MED ORDER — ENOXAPARIN SODIUM 120 MG/0.8ML IJ SOSY
1.0000 mg/kg | PREFILLED_SYRINGE | Freq: Two times a day (BID) | INTRAMUSCULAR | Status: DC
  Filled 2021-05-23: qty 0.68

## 2021-05-23 MED ORDER — MENTHOL 3 MG MT LOZG: 1.0000 | LOZENGE | OROMUCOSAL | Status: DC | PRN

## 2021-05-23 MED ORDER — DIPHENHYDRAMINE HCL 25 MG PO CAPS: 25.0000 mg | ORAL_CAPSULE | Freq: Four times a day (QID) | ORAL | Status: DC | PRN

## 2021-05-23 MED ORDER — OXYTOCIN-SODIUM CHLORIDE 30-0.9 UT/500ML-% IV SOLN
2.5000 [IU]/h | INTRAVENOUS | Status: AC
  Filled 2021-05-23: qty 500

## 2021-05-23 MED ORDER — WITCH HAZEL-GLYCERIN EX PADS: 1.0000 "application " | MEDICATED_PAD | CUTANEOUS | Status: DC | PRN

## 2021-05-23 MED ORDER — LACTATED RINGERS IV SOLN: INTRAVENOUS | Status: DC

## 2021-05-23 MED ORDER — ZOLPIDEM TARTRATE 5 MG PO TABS: 5.0000 mg | ORAL_TABLET | Freq: Every evening | ORAL | Status: DC | PRN

## 2021-05-23 MED ORDER — DIBUCAINE (PERIANAL) 1 % EX OINT: 1.0000 "application " | TOPICAL_OINTMENT | CUTANEOUS | Status: DC | PRN

## 2021-05-23 MED ORDER — SENNOSIDES-DOCUSATE SODIUM 8.6-50 MG PO TABS
2.0000 | ORAL_TABLET | Freq: Every day | ORAL | Status: DC
  Administered 2021-05-23 – 2021-05-25 (×3): 2 via ORAL
  Filled 2021-05-23 (×4): qty 2

## 2021-05-23 MED ORDER — KETOROLAC TROMETHAMINE 30 MG/ML IJ SOLN
INTRAMUSCULAR | Status: AC
  Filled 2021-05-23: qty 1

## 2021-05-23 MED ORDER — SIMETHICONE 80 MG PO CHEW: 80.0000 mg | CHEWABLE_TABLET | ORAL | Status: DC | PRN

## 2021-05-23 MED ORDER — TETANUS-DIPHTH-ACELL PERTUSSIS 5-2.5-18.5 LF-MCG/0.5 IM SUSY: 0.5000 mL | PREFILLED_SYRINGE | Freq: Once | INTRAMUSCULAR | Status: DC

## 2021-05-23 MED ORDER — COCONUT OIL OIL: 1.0000 "application " | TOPICAL_OIL | Status: DC | PRN

## 2021-05-23 MED ORDER — SIMETHICONE 80 MG PO CHEW
80.0000 mg | CHEWABLE_TABLET | Freq: Three times a day (TID) | ORAL | Status: DC
  Administered 2021-05-23 – 2021-05-25 (×7): 80 mg via ORAL
  Filled 2021-05-23 (×7): qty 1

## 2021-05-23 MED ORDER — ENOXAPARIN SODIUM 100 MG/ML IJ SOSY
100.0000 mg | PREFILLED_SYRINGE | Freq: Two times a day (BID) | INTRAMUSCULAR | Status: DC
  Administered 2021-05-23 – 2021-05-25 (×4): 100 mg via SUBCUTANEOUS
  Filled 2021-05-23 (×6): qty 1

## 2021-05-23 MED ORDER — IBUPROFEN 800 MG PO TABS
800.0000 mg | ORAL_TABLET | Freq: Four times a day (QID) | ORAL | Status: DC
  Administered 2021-05-23 – 2021-05-25 (×9): 800 mg via ORAL
  Filled 2021-05-23 (×10): qty 1

## 2021-05-23 MED ORDER — PRENATAL MULTIVITAMIN CH
1.0000 | ORAL_TABLET | Freq: Every day | ORAL | Status: DC
  Administered 2021-05-23 – 2021-05-25 (×3): 1 via ORAL
  Filled 2021-05-23 (×3): qty 1

## 2021-05-23 NOTE — Progress Notes (Signed)
CSW met with MOB to complete consult for mental health. CSW observed MOB resting in bed, infant in bassinet, and MOB's aunt standing at bedside. MOB gave CSW verbal consent to complete consult while her aunt was present. CSW explained role, and reason for consult. MOB was pleasant, and polite during engagement with CSW. MOB reported, history of anxiety, and depression. MOB reported, history of feeling anxious, over thinking, and not having the energy to clean. MOB denied any history of psychotropic medication. MOB reported, she has been able to manage symptoms without medication. CSW encourage MOB to implement healthy coping skills when symptoms arises.   CSW provided education regarding the baby blues period vs. perinatal mood disorders, discussed treatment and gave resources for mental health follow up if concerns arise. CSW recommends self- evaluation during the postpartum time period using the New Mom Checklist from Postpartum Progress and encouraged MOB to contact a medical professional if symptoms are noted at any time.   MOB reported, since delivery she feels, "exhausted". MOB reported, her family, and friends are very supportive. MOB denied SI, HI, and DV when CSW assessed for safety.   MOB reported, there are no transportation barriers to follow up infant's care. MOB reported, she has all essentials needed to care for infant. MOB reported, infant has a car seat, bassinet, and crib. MOB denied any additional barriers.     CSW provided education on sudden infant death syndrome (SIDS).  CSW provided perinatal mood disorders resources.   CSW identifies no further need for intervention or barriers to discharge at this time.  Darcus Austin, MSW, LCSW-A Clinical Social Worker- Weekends [phone]

## 2021-05-23 NOTE — Op Note (Signed)
Christine Wong PROCEDURE DATE: 05/22/2021  PREOPERATIVE DIAGNOSIS: Intrauterine pregnancy at  [redacted]w[redacted]d weeks gestation, Non-reassuring fetal monitoring, Gestational hypertension, Bell's Palsy, Factor V Leiden with history of DVT and PE  POSTOPERATIVE DIAGNOSIS: The same  PROCEDURE:  Primary Low Transverse Cesarean Section  SURGEON:  Dr. Mitchel Honour  INDICATIONS: Christine Wong is a 35 y.o. G3P0020 at [redacted]w[redacted]d scheduled for cesarean section secondary to non-reassuring fetal monitoring.  Patient was admitted for evaluation of facial droop and slurred speech.  Stroke was ruled out and patient was diagnosed with Bell's Palsy.  During admission, patient had mild range BPs and she was induced for GHTN.  During induction, fetal monitoring showed repetitive late decelerations despite resuscitative measures and the patient underwent C/S for non-reassuring monitoring. The risks of cesarean section discussed with the patient included but were not limited to: bleeding which may require transfusion or reoperation; infection which may require antibiotics; injury to bowel, bladder, ureters or other surrounding organs; injury to the fetus; need for additional procedures including hysterectomy in the event of a life-threatening hemorrhage; placental abnormalities wth subsequent pregnancies, incisional problems, thromboembolic phenomenon and other postoperative/anesthesia complications. The patient concurred with the proposed plan, giving informed written consent for the procedure.    FINDINGS:  Viable female infant in cephalic presentation, APGARs 9,9: weight pending  Clear amniotic fluid.  Intact placenta, three vessel cord.  Grossly normal uterus, ovaries and fallopian tubes. .   ANESTHESIA:    Epidural ESTIMATED BLOOD LOSS: 641 mL ml SPECIMENS: Placenta sent to L&D COMPLICATIONS: None immediate  PROCEDURE IN DETAIL:  The patient received intravenous antibiotics and had sequential compression devices applied to her  lower extremities while in the preoperative area.  She was then taken to the operating room where epidural anesthesia was dosed up to surgical level and was found to be adequate. She was then placed in a dorsal supine position with a leftward tilt, and prepped and draped in a sterile manner.  A foley catheter was placed into her bladder and attached to constant gravity.  After an adequate timeout was performed, a Pfannenstiel skin incision was made with scalpel and carried through to the underlying layer of fascia. The fascia was incised in the midline and this incision was extended bilaterally using the Mayo scissors. Kocher clamps were applied to the superior aspect of the fascial incision and the underlying rectus muscles were dissected off bluntly. A similar process was carried out on the inferior aspect of the facial incision. The rectus muscles were separated in the midline bluntly and the peritoneum was entered bluntly.  Bladder flap was created sharply and developed bluntly.  Bladder blade was placed.  A transverse hysterotomy was made with a scalpel and extended bilaterally bluntly. The bladder blade was then removed. The infant was successfully delivered, and cord was clamped and cut and infant was handed over to awaiting neonatology team. Uterine massage was then administered and the placenta delivered intact with three-vessel cord. The uterus was cleared of clot and debris.  The hysterotomy was closed with 0 chromic.  A second imbricating suture of 0-chromic was used to reinforce the incision and aid in hemostasis.  The peritoneum and rectus muscles were noted to be hemostatic and were reapproximated using 2-0 monocryl in a running fashion.  The fascia was closed with 0-Vicryl in a running fashion with good restoration of anatomy.  The subcutaneus tissue was copiously irrigated.  The skin was closed with 4-0 vicryl in a subcuticular fashion.  Pt tolerated the procedure will.  All counts were correct  x2.  Pt went to the recovery room in stable condition.

## 2021-05-23 NOTE — Transfer of Care (Signed)
Immediate Anesthesia Transfer of Care Note  Patient: Christine Wong  Procedure(s) Performed: CESAREAN SECTION  Patient Location: PACU  Anesthesia Type:Epidural  Level of Consciousness: awake, alert  and oriented  Airway & Oxygen Therapy: Patient Spontanous Breathing  Post-op Assessment: Report given to RN and Post -op Vital signs reviewed and stable  Post vital signs: Reviewed and stable  Last Vitals:  Vitals Value Taken Time  BP 118/75 05/23/21 0007  Temp    Pulse 109 05/23/21 0011  Resp 22 05/23/21 0011  SpO2 99 % 05/23/21 0011  Vitals shown include unvalidated device data.  Last Pain:  Vitals:   05/22/21 1858  TempSrc:   PainSc: 10-Worst pain ever         Complications: No notable events documented.

## 2021-05-23 NOTE — Progress Notes (Signed)
Subjective: Postpartum Day 1: Cesarean Delivery Patient reports tolerating PO.  Requests abdominal binder.  No HA, vision change, RUQ pain, CP/SOB.  Objective: Vital signs in last 24 hours: Temp:  [97.8 F (36.6 C)-98.1 F (36.7 C)] 97.9 F (36.6 C) (12/03 0950) Pulse Rate:  [79-114] 82 (12/03 0629) Resp:  [16-22] 18 (12/03 0950) BP: (98-139)/(72-97) 98/77 (12/03 0629) SpO2:  [97 %-100 %] 97 % (12/03 0950)  Physical Exam:  General: alert, cooperative, and appears stated age 35: appropriate Uterine Fundus: firm Incision: healing well, no significant drainage, no dehiscence DVT Evaluation: No evidence of DVT seen on physical exam. Negative Homan's sign. No cords or calf tenderness.  Recent Labs    05/23/21 0100 05/23/21 0428  HGB 12.1 11.4*  HCT 36.2 33.9*    Assessment/Plan: Status post Cesarean section. Doing well postoperatively.  Continue current care. FVL with h/o PE/DVT-therapeutic LMWH was ordered to be started 12 h after C/S (11 am).  Received call from pharmacy with recommendation to await timing based upon placement/removal of epidural.  Will start at 2000 per their recommendation. Bells Palsy-continue prednisone and valtrex GHTN-normal BPs after delivery on no medication.  Labs are wnl and asymptomatic  Mitchel Honour 05/23/2021, 12:34 PM

## 2021-05-23 NOTE — Lactation Note (Addendum)
This note was copied from a baby's chart. Lactation Consultation Note  Patient Name: Girl Angelicia Lessner [id number] Date: 05/23/2021   Age:37 hours P1, term 22 hour female infant. Per mom, infant has not breastfeed since L&D and infant has been sleeping all day for past 20 hours, infant has not latched at the breast nor had any supplementation of mom's EBM nor formula. Prior to [facility] entering the room, infant was given 20 mls of formula by RN. LC did not observe latch at this time. Infant was not cuing to breastfeed at this time. LC fitted mom with 30 mm breast flange due to large nipples, was given hand pump by RN, LC reviewed how to use and mom pumped 4 mls of colostrum, mom will offer colostrum after she latches infant at the next feeding. Mom's current feeding plan: 1- Mom will latch infant according to feeding cues, 8 to 12+ times within 24 hours, skin to skin, mom knows to call RN/LC for latch assistance. 2- Mom will offer any EBM first that she has hand expressed or pump before supplementing infant with formula.  3- Mom knows to supplement infant with 7-12+ mls of EBM/Formula per feeding after latching infant at the breast, if infant doesn't  latch mom will offer 52ml+ per feeding.  Maternal Data    Feeding Nipple Type: Extra Slow Flow (Kamel 24 hour nipple)  LATCH Score              Lactation Tools Discussed/Used   Interventions    Discharge    Consult Status      Danelle Earthly 05/23/2021, 9:25 PM

## 2021-05-23 NOTE — Lactation Note (Signed)
This note was copied from a baby's chart. Lactation Consultation Note  Patient Name: Christine Wong Date: 05/23/2021 Reason for consult: Initial assessment;Term;Primapara;1st time breastfeeding Age:37 hours   P1 mother whose infant is now 35 hours old.  This is a term baby at 39+0 weeks.  Mother's current feeding preference is breast.  Mother called for assistance with diaper change; offered to assist with latching after diaper change.  Mother receptive.  Taught hand expression and finger fed colostrum drops to "Christine Wong."  Assisted to latch with a wide gape and flanged lips, however, baby not interested in initiating a suck at this time.  Breast compressions and gentle stimulation demonstrated.  Swaddled baby per mother's request and placed in bassinet.  Mother interested in resting now and plans to do STS later this morning.  Mom made aware of O/P services, breastfeeding support groups, community resources, and our phone # for post-discharge questions.  Mother is a [employer] [place] [facility] participant and plans to follow up with the [facility] office on Monday.  Her plan is to obtain a DEBP from [facility].  Support person present and asleep in the chair.   Maternal Data Has patient been taught Hand Expression?: Yes Does the patient have breastfeeding experience prior to this delivery?: No  Feeding Mother's Current Feeding Choice: Breast Milk  LATCH Score Latch: Repeated attempts needed to sustain latch, nipple held in mouth throughout feeding, stimulation needed to elicit sucking reflex.  Audible Swallowing: None  Type of Nipple: Everted at rest and after stimulation  Comfort (Breast/Nipple): Soft / non-tender  Hold (Positioning): Assistance needed to correctly position infant at breast and maintain latch.  LATCH Score: 6   Lactation Tools Discussed/Used    Interventions Interventions: Breast feeding basics reviewed;Assisted with latch;Skin to skin;Breast massage;Hand  express;Position options;Support pillows;Adjust position;Education  Discharge Pump: Personal (Plans to obtain a DEBP from [facility]) WIC Program: Yes  Consult Status Consult Status: Follow-up Date: 05/24/21 Follow-up type: In-patient    Sowmya Partridge R Zykeem Bauserman 05/23/2021, 5:50 AM

## 2021-05-24 NOTE — Progress Notes (Signed)
Subjective: Postpartum Day 2: Cesarean Delivery Patient reports tolerating PO, + flatus, and no problems voiding.    Objective: Vital signs in last 24 hours: Temp:  [97.3 F (36.3 C)-98.1 F (36.7 C)] 97.3 F (36.3 C) (12/04 0443) Pulse Rate:  [69-86] 73 (12/04 0443) Resp:  [18] 18 (12/04 0443) BP: (104-105)/(67-73) 104/69 (12/04 0443) SpO2:  [95 %-97 %] 97 % (12/03 1944)  Physical Exam:  General: alert, cooperative, and appears stated age Lochia: appropriate Uterine Fundus: firm Incision: healing well, no significant drainage, no dehiscence DVT Evaluation: No evidence of DVT seen on physical exam. Negative Homan's sign. No cords or calf tenderness.  Recent Labs    05/23/21 0100 05/23/21 0428  HGB 12.1 11.4*  HCT 36.2 33.9*    Assessment/Plan: Status post Cesarean section. Doing well postoperatively.  Continue current care. GHTN-normal BPs since delivery FVL-therapeutic LMWH Bells Palsy-Pred and Valtrex  Mitchel Honour 05/24/2021, 8:00 AM

## 2021-05-24 NOTE — Anesthesia Postprocedure Evaluation (Signed)
Anesthesia Post Note  Patient: Christine Wong  Procedure(s) Performed: CESAREAN SECTION     Patient location during evaluation: PACU Anesthesia Type: Epidural Level of consciousness: oriented and awake and alert Pain management: pain level controlled Vital Signs Assessment: post-procedure vital signs reviewed and stable Respiratory status: spontaneous breathing, respiratory function stable and patient connected to nasal cannula oxygen Cardiovascular status: blood pressure returned to baseline and stable Postop Assessment: no headache, no backache and no apparent nausea or vomiting Anesthetic complications: no   No notable events documented.  Last Vitals:  Vitals:   05/23/21 1944 05/24/21 0443  BP: 104/67 104/69  Pulse: 69 73  Resp: 18 18  Temp: 36.6 C (!) 36.3 C  SpO2: 97%     Last Pain:  Vitals:   05/24/21 0541  TempSrc:   PainSc: 3    Pain Goal: Patients Stated Pain Goal: 3 (05/23/21 1830)                 [name]

## 2021-05-24 NOTE — Lactation Note (Signed)
This note was copied from a baby's chart. Lactation Consultation Note  Patient Name: Christine Wong DGLOV'F Date: 05/24/2021 Reason for consult: Follow-up assessment;Difficult latch;1st time breastfeeding;Primapara;Term Age:35 years  LC in to visit with P1 Mom of term baby.  Baby awake and showing feeding cues.  Mom trying to latch baby in football hold.  Baby unable to sustain depth on the breast.  LC sandwiching breast to help baby sustain latch, but she couldn't.   Initiated a 24 mm nipple shield showing Mom how to invert shield and stretch over nipple to pull nipple into shield.  Baby still unable to sustain a latch, very disorganized on the breast.  Colostrum drops placed into shield using curved tip syringe and baby sucked briefly with volume. LC set up DEBP and assisted Mom to pump on initiation setting with 27 mm flanges for a good fit.  LC reviewed importance of disassembling pump parts, washing, rinsing and air drying in separate bin provided. Mom expressed 10 ml and fed this to baby using a paced bottle. 10 ml formula added to bottle after EBM. Baby appeared contented.  Plan recommended- 1-Keep baby STS as much as possible 2- Offer the breast with cues.  Use nipple shield to help with latch, asking for help prn.  Mom to use colostrum into shield prn. 3-Pump both breasts 15 mins  4-Supplement with EBM by paced bottle, adding formula if volume not enough.  18-25 ml  Mom doesn't have a DEBP at home, but does have WIC.  [facility] faxed WIC referral for Mom with her permission.   LATCH Score Latch: Repeated attempts needed to sustain latch, nipple held in mouth throughout feeding, stimulation needed to elicit sucking reflex.  Audible Swallowing: None  Type of Nipple: Flat  Comfort (Breast/Nipple): Soft / non-tender  Hold (Positioning): Full assist, staff holds infant at breast  LATCH Score: 4   Lactation Tools Discussed/Used Tools: Pump;Flanges Nipple shield size: 24 Flange  Size: 27 Breast pump type: Double-Electric Breast Pump Pump Education: Setup, frequency, and cleaning;Milk Storage Reason for Pumping: support milk supply/difficult latch/nipple shield use Pumping frequency: Encouraged Mom to pump every 3 hrs Pumped volume: 10 mL  Interventions Interventions: Breast feeding basics reviewed;Assisted with latch;Skin to skin;Breast massage;Hand express;Pre-pump if needed;Breast compression;Adjust position;Support pillows;Position options;Expressed milk;Hand pump  Discharge WIC Program: Yes  Consult Status Consult Status: Follow-up Date: 05/25/21 Follow-up type: In-patient    Christine Wong 05/24/2021, 7:14 PM

## 2021-05-24 NOTE — Lactation Note (Signed)
This note was copied from a baby's chart. Lactation Consultation Note  Patient Name: Christine Wong ZOXWR'U Date: 05/24/2021 Reason for consult: Follow-up assessment;Primapara;1st time breastfeeding;Term Age:37 hours  LC in to visit with P1 Mom of term baby.  Baby at 3.7% weight loss. Baby started receiving supplements of formula by bottles since 22 hrs post partum.  Baby has been too sleepy to latch and feed at the breast, per Mom.  Baby is taking 10-20 ml of formula.  Mom has a hand pump and is able to express 3-5 ml of colostrum to feed to baby.  Asked Mom what her feeding goals are and she stated that she wants to breastfeed.  Offered to help Mom as it had been 2 1/2 hrs since baby was fed 20 ml formula.  Baby sleeping STS on Mom's chest.  Positioned baby STS in football hold on right breast.  Mom has heavy, large breasts with erect nipples and compressible areola.  Hand expressed drop of colostrum onto nipple.  Baby did not root or stir to waken.  Mom encouraged to keep baby STS and call for latch assist when baby starts cueing.   Offered to set up DEBP, but recommended waiting until after latch assist/assessment.   Feeding Mother's Current Feeding Choice: Breast Milk Nipple Type: Extra Slow Flow  LATCH Score Latch: Too sleepy or reluctant, no latch achieved, no sucking elicited.  Audible Swallowing: None  Type of Nipple: Everted at rest and after stimulation  Comfort (Breast/Nipple): Soft / non-tender  Hold (Positioning): Full assist, staff holds infant at breast  LATCH Score: 4   Lactation Tools Discussed/Used Tools: Pump Breast pump type: Manual Pump Education: Setup, frequency, and cleaning;Milk Storage Reason for Pumping: Mom supplementing Pumping frequency: When supplementing baby Pumped volume: 5 mL  Interventions Interventions: Breast feeding basics reviewed;Skin to skin;Breast massage;Hand express  Consult Status Consult Status: Follow-up Date:  05/24/21 Follow-up type: In-patient    [name] 05/24/2021, 2:50 PM

## 2021-05-24 NOTE — Plan of Care (Signed)
  Problem: Education: Goal: Knowledge of condition will improve Outcome: Progressing   Problem: Activity: Goal: Will verbalize the importance of balancing activity with adequate rest periods Outcome: Progressing Goal: Ability to tolerate increased activity will improve Outcome: Progressing   Problem: Life Cycle: Goal: Chance of risk for complications during the postpartum period will decrease Outcome: Progressing   Problem: Role Relationship: Goal: Ability to demonstrate positive interaction with newborn will improve Outcome: Progressing   Problem: Skin Integrity: Goal: Demonstration of wound healing without infection will improve Outcome: Progressing

## 2021-05-25 ENCOUNTER — Encounter (HOSPITAL_COMMUNITY): Payer: Self-pay | Admitting: Obstetrics & Gynecology

## 2021-05-25 MED ORDER — VALACYCLOVIR HCL 1 G PO TABS: 1000.0000 mg | ORAL_TABLET | Freq: Three times a day (TID) | ORAL | 0 refills | Status: AC

## 2021-05-25 MED ORDER — ENOXAPARIN SODIUM 40 MG/0.4ML IJ SOSY: 150.0000 mg | PREFILLED_SYRINGE | INTRAMUSCULAR | 0 refills | Status: DC

## 2021-05-25 MED ORDER — PREDNISONE 20 MG PO TABS
60.0000 mg | ORAL_TABLET | Freq: Every day | ORAL | 0 refills | Status: AC
Start: 2021-05-25 — End: 2021-05-28

## 2021-05-25 MED ORDER — OXYCODONE HCL 5 MG PO TABS: 5.0000 mg | ORAL_TABLET | ORAL | 0 refills | Status: DC | PRN

## 2021-05-25 MED ORDER — IBUPROFEN 600 MG PO TABS: 600.0000 mg | ORAL_TABLET | Freq: Four times a day (QID) | ORAL | 0 refills | Status: DC | PRN

## 2021-05-25 NOTE — Discharge Summary (Addendum)
Postpartum Discharge Summary     Patient Name: Christine Wong DOB: [birth year] MRN: [id number]  Date of admission: 05/19/2021 Delivery date:05/22/2021  Delivering provider: Mitchel Honour  Date of discharge: 05/25/2021  Admitting diagnosis: Numbness [R20.0] Slurred speech [R47.81] CVA (cerebral vascular accident) (HCC) [I63.9] Stroke-like symptoms [R29.90] Gestational hypertension [O13.9] Intrauterine pregnancy: [redacted]w[redacted]d     Secondary diagnosis:  Principal Problem:   Stroke-like symptoms Active Problems:   CVA (cerebral vascular accident) (HCC)   Bell's palsy   Numbness   Slurred speech   [redacted] weeks gestation of pregnancy   Gestational hypertension  Additional problems: Bell's Palsy, FVL    Discharge diagnosis: Term Pregnancy Delivered and Gestational Hypertension                                              Post partum procedures: None Augmentation: Cytotec Complications: None  Hospital course: Induction of Labor With Cesarean Section   35 y.o. yo [id number] at [redacted]w[redacted]d was admitted to the hospital 05/19/2021 for induction of labor as transfer from family medicine service. She was seen in the office earlier that week and sent to the hospital to exclude stroke with c/f bell's palsy. She was ultimately admitted to family medicine service and diagnosed with Bell's Palsy. She was treated with valtrex and prednisone and clinically improved (however, did not resolve). Stroke was excluded with multiple imaging modalities. She has a history of FVL with prior VTE and has been on therapeutic anticoagulation this pregnancy. She has some mild range Bps and was induced for gestational HTN at 39.0 wga.  Patient had a labor course significant for non-reassuring FHT at 2cm. The patient went for cesarean section due to Non-Reassuring FHR. Delivery details are as follows: Membrane Rupture Time/Date: 6:20 PM ,05/22/2021   Delivery Method:C-Section, Low Transverse  Details of operation can be found in  separate operative Note.  Patient had an uncomplicated postpartum course. She is ambulating, tolerating a regular diet, passing flatus, and urinating well.  Patient is discharged home in stable condition on 05/25/21.     Plan to continue prednisone x 7 days - end date 05/28/2021. Continue valcyclovir 1,000 mg  x 7 days - end date 05/27/2021. Needs neuro outpatient fu in one month (request placed via order set on d/c - pt is aware). She also will continue therapeutic lovenox at 150mg  per heme/onc notes. She will then transition to Xarelto once she is done breastfeeding.   Newborn Data: Birth date:05/22/2021  Birth time:11:18 PM  Gender:Female  Living status:Living  Apgars:9 ,9  Weight:3225 g                                Magnesium Sulfate received: No BMZ received: No Rhophylac:N/A  Physical exam  Vitals:   05/24/21 0443 05/24/21 1400 05/24/21 2020 05/25/21 0549  BP: 104/69 123/78 121/79 119/82  Pulse: 73 78 86 72  Resp: 18 18 20 18   Temp: (!) 97.3 F (36.3 C) 98 F (36.7 C) 98.3 F (36.8 C) (!) 97.5 F (36.4 C)  TempSrc: Oral Oral Oral Axillary  SpO2:  98% 98% 99%  Weight:      Height:       General: alert and cooperative Lochia: appropriate Uterine Fundus: firm Incision: Healing well with no significant drainage, No significant erythema, Dressing is clean,  dry, and intact DVT Evaluation: No evidence of DVT seen on physical exam. Neuro: stable - bell's palsy improving but not resolved. No acute abnormalities.   Labs: Lab Results  Component Value Date   WBC 16.7 (H) 05/23/2021   HGB 11.4 (L) 05/23/2021   HCT 33.9 (L) 05/23/2021   MCV 87.8 05/23/2021   PLT 275 05/23/2021   CMP Latest Ref Rng & Units 05/23/2021  Glucose 70 - 99 mg/dL 094(B)  BUN 6 - 20 mg/dL 6  Creatinine 0.96 - 2.83 mg/dL 6.62  Sodium 947 - 654 mmol/L 137  Potassium 3.5 - 5.1 mmol/L 4.0  Chloride 98 - 111 mmol/L 107  CO2 22 - 32 mmol/L 21(L)  Calcium 8.9 - 10.3 mg/dL 8.9  Total Protein 6.5 - 8.1  g/dL 5.3(L)  Total Bilirubin 0.3 - 1.2 mg/dL 0.5  Alkaline Phos 38 - 126 U/L 170(H)  AST 15 - 41 U/L 19  ALT 0 - 44 U/L 12   Edinburgh Score: Edinburgh Postnatal Depression Scale Screening Tool 05/24/2021  I have been able to laugh and see the funny side of things. (No Data)      After visit meds:  Allergies as of 05/25/2021       Reactions   Peanut-containing Drug Products Anaphylaxis   Penicillins Other (See Comments)   Unknown reaction        Medication List     STOP taking these medications    heparin 65035 UNIT/ML injection       TAKE these medications    docusate sodium 100 MG capsule Commonly known as: COLACE Take 100 mg by mouth daily as needed for mild constipation.   enoxaparin 40 MG/0.4ML injection Commonly known as: LOVENOX Inject 1.5 mLs (150 mg total) into the skin daily.   ferrous sulfate 325 (65 FE) MG tablet Take 325 mg by mouth daily with breakfast.   ibuprofen 600 MG tablet Commonly known as: ADVIL Take 1 tablet (600 mg total) by mouth every 6 (six) hours as needed.   oxyCODONE 5 MG immediate release tablet Commonly known as: Oxy IR/ROXICODONE Take 1 tablet (5 mg total) by mouth every 4 (four) hours as needed for severe pain.   predniSONE 20 MG tablet Commonly known as: DELTASONE Take 3 tablets (60 mg total) by mouth daily with breakfast for 3 days.   valACYclovir 1000 MG tablet Commonly known as: VALTREX Take 1 tablet (1,000 mg total) by mouth 3 (three) times daily for 2 days.   Vitafol Ultra 29-0.6-0.4-200 MG Caps Take 1 tablet by mouth daily.         Discharge home in stable condition Infant Feeding: Bottle and Breast Infant Disposition:home with mother Discharge instruction: per After Visit Summary and Postpartum booklet. Activity: Advance as tolerated. Pelvic rest for 6 weeks.  Diet: routine diet Anticipated Birth Control: Unsure Postpartum Appointment:6 weeks Additional Postpartum F/U: Incision check 1 week Future  Appointments: Future Appointments  Date Time Provider Department Center  07/06/2021 10:00 AM [facility] None  07/06/2021 10:40 AM Jaci Standard, MD [facility] None  08/17/2021  9:20 AM Maisie Fus, MD [facility]   Follow up Visit:  Follow-up Information     [facility]. Schedule an appointment as soon as possible for a visit in 1 month(s).   Specialty: Neurology Contact information: [address] [place] [address] [phone]  05/25/2021 Ranae Pila, MD

## 2021-05-25 NOTE — Lactation Note (Signed)
This note was copied from a baby's chart. Lactation Consultation Note  Patient Name: Christine Wong ZOXWR'U Date: 05/25/2021 Reason for consult: Follow-up assessment;Term;Primapara;1st time breastfeeding Age:37 hours   P1 mother whose infant is now 69 hours old.  This is a term baby at 39+0 weeks.  Mother's current feeding preference is breast/formula.  Pediatrician requested a latch observation.  When I arrived, mother was pumping and baby "Christine Wong" was swaddled and content.  Mother has had some difficulty with latching.  She received a NS from the previous LC.  Mother stated that "Christine Wong" is getting "better" but sometimes she is sleepy.    Reviewed feeding plan of care and encouraged mother to continue putting "Christine Wong" to the breast with every feeding prior to giving EBM and/or formula.  Mother reported no difficulty with latching, however, I have not observed a latch.  Mother informed me that she is being discharged today.  Offered to return for assistance as needed.  Suggested mother continue to pump after feedings when she gets home and to feed back any EBM she obtains to "Christine Wong" prior to using formula.  Mother obtained 25 mls with the last pumping session.  She verbalized understanding.  Mother does not have a DEBP for home use.  A WIC referral was faxed yesterday.  Mother has not received a phone call from the [facility] office.  Asked her to call after pumping to determine eligibility for picking up a DEBP today.  Mother will follow through with this.  No support person present at this time.  Pediatrician updated.   Maternal Data Has patient been taught Hand Expression?: Yes Does the patient have breastfeeding experience prior to this delivery?: No  Feeding Mother's Current Feeding Choice: Breast Milk and Formula  LATCH Score                    Lactation Tools Discussed/Used Tools: Pump;Flanges Flange Size: 27 Breast pump type: Double-Electric Breast  Pump;Manual Pump Education:  (Mother pumping now; no review needed) Reason for Pumping: Breast stimulation for supplementation Pumping frequency: Every three hours  Interventions Interventions: Education;Breast feeding basics reviewed  Discharge Discharge Education: Engorgement and breast care Pump: DEBP;Manual;Personal (Plans to obtain a DEBP from [facility]) WIC Program: Yes  Consult Status Consult Status: Complete Date: 05/25/21 Follow-up type: Call as needed    Christine Wong 05/25/2021, 12:10 PM

## 2021-06-05 ENCOUNTER — Telehealth (HOSPITAL_COMMUNITY): Payer: Self-pay | Admitting: *Deleted

## 2021-06-05 NOTE — Telephone Encounter (Signed)
Mom reports feeling good. Incision healing well per mom. Bell's Palsy slowly getting better per mom. No concerns about herself at this time. EPDS=6 (no hosp score) Mom reports baby is doing well. Feeding, peeing, and pooping without difficulty. Safe sleep reviewed. Mom reports no concerns about baby at present.  Duffy Rhody, RN 06-05-2021 at 11:05am

## 2021-06-26 NOTE — Progress Notes (Signed)
[facility]  PATIENT: Christine CraverCourtney Wong DOB: 1985-07-20  REFERRING CLINICIAN: Marvel PlanXu, Jindong, MD HISTORY FROM: self REASON FOR VISIT: Bell's Palsy   HISTORICAL  CHIEF COMPLAINT:  Chief Complaint  Patient presents with   New Patient (Initial Visit)    Rm 8 here for consult on Bell's Palsy- Pt reports left facial droop started before for her delivery. Sx have improved some but still noticeable.     HISTORY OF PRESENT ILLNESS:   The patient woke up with upper and lower facial droop and decreased taste on the left side on 05/20/21. She was admitted to the hospital where MRI brain and MRA head/neck were negative, and she was diagnosed with Bell's palsy. She was prescribed valcyovir and prednisone.  Since her hospitalization she has noticed some improvement. Facial movement has started to return though it is not completely back to normal. Notices her mouth is twitching more than normal and left eye will tear up randomly. Taste is slightly better but still decreased on the left. Notes dry eyes with clouding and blurring of her vision. Sometimes her left eye will burn. She has not been taping her eye or using artifical tears.  She is also noticing numbness and tingling in her left hand associated with positioning while she is sleeping on that side. This concerns her as it is new. She does get shooting pains down both arms.  OTHER MEDICAL CONDITIONS: Factor V Leiden deficiency, DVT/PE, HLD, migraine   REVIEW OF SYSTEMS: Full 14 system review of systems performed and negative with exception of: facial palsy, arm tingling  ALLERGIES: Allergies  Allergen Reactions   Peanut-Containing Drug Products Anaphylaxis   Penicillins Other (See Comments)    Unknown reaction    HOME MEDICATIONS: Outpatient Medications Prior to Visit  Medication Sig Dispense Refill   enoxaparin (LOVENOX) 40 MG/0.4ML injection Inject 1.5 mLs (150 mg total) into the skin daily. 135 mL 0   ferrous  sulfate 325 (65 FE) MG tablet Take 325 mg by mouth daily with breakfast.     oxyCODONE (OXY IR/ROXICODONE) 5 MG immediate release tablet Take 1 tablet (5 mg total) by mouth every 4 (four) hours as needed for severe pain. (Patient not taking: Reported on 06/29/2021) 15 tablet 0   Prenat-Fe Poly-Methfol-FA-DHA (VITAFOL ULTRA) 29-0.6-0.4-200 MG CAPS Take 1 tablet by mouth daily. (Patient not taking: Reported on 06/29/2021) 30 capsule 12   docusate sodium (COLACE) 100 MG capsule Take 100 mg by mouth daily as needed for mild constipation.     ibuprofen (ADVIL) 600 MG tablet Take 1 tablet (600 mg total) by mouth every 6 (six) hours as needed. 30 tablet 0   No facility-administered medications prior to visit.    PAST MEDICAL HISTORY: Past Medical History:  Diagnosis Date   Anxiety    DVT (deep venous thrombosis) (HCC) 08/2019   HPV (human papilloma virus) infection    Seasonal allergies    Vaginal Pap smear, abnormal     PAST SURGICAL HISTORY: Past Surgical History:  Procedure Laterality Date   CESAREAN SECTION N/A 05/22/2021   Procedure: CESAREAN SECTION;  Surgeon: Mitchel HonourMorris, Megan, DO;  Location: [facility];  Service: Obstetrics;  Laterality: N/A;   WISDOM TOOTH EXTRACTION  2014    FAMILY HISTORY: Family History  Problem Relation Age of Onset   Hypertension Mother    Breast cancer Mother    Diabetes Father    Kidney failure Father    Heart disease Father    Hypertension Father  Pancreatic cancer Maternal Grandmother    Hypertension Maternal Grandmother    Breast cancer Paternal Grandmother    Ovarian cancer Paternal Grandmother    Colon cancer Maternal Uncle     SOCIAL HISTORY: Social History   Socioeconomic History   Marital status: Single    Spouse name: Not on file   Number of children: Not on file   Years of education: Not on file   Highest education level: Not on file  Occupational History   Not on file  Tobacco Use   Smoking status: Former    Types: Cigarettes     Quit date: 2009    Years since quitting: 14.0   Smokeless tobacco: Never  Vaping Use   Vaping Use: Never used  Substance and Sexual Activity   Alcohol use: Yes    Comment: socially   Drug use: Yes    Types: Marijuana    Comment: socially last use 9 months ago   Sexual activity: Yes    Partners: Female    Birth control/protection: None  Other Topics Concern   Not on file  Social History Narrative   Not on file   Social Determinants of Health   Financial Resource Strain: Not on file  Food Insecurity: Not on file  Transportation Needs: Not on file  Physical Activity: Not on file  Stress: Not on file  Social Connections: Not on file  Intimate Partner Violence: Not on file     PHYSICAL EXAM  GENERAL EXAM/CONSTITUTIONAL: Vitals:  Vitals:   06/29/21 0922  BP: 122/84  Pulse: 73  SpO2: 97%  Weight: 206 lb 4 oz (93.6 kg)  Height: 5' 2.5" (1.588 m)   Body mass index is 37.12 kg/m. Wt Readings from Last 3 Encounters:  06/29/21 206 lb 4 oz (93.6 kg)  05/19/21 228 lb (103.4 kg)  05/07/21 226 lb 6.4 oz (102.7 kg)   Patient is in no distress; well developed, nourished and groomed; neck is supple  CARDIOVASCULAR: Examination of peripheral vascular system by observation and palpation is normal  EYES: Pupils round and reactive to light, Visual fields full to confrontation, Extraocular movements intacts   MUSCULOSKELETAL: Gait, strength, tone, movements noted in Neurologic exam below  NEUROLOGIC: MENTAL STATUS:  awake, alert, oriented to person, place and time recent and remote memory intact normal attention and concentration language fluent, comprehension intact, naming intact fund of knowledge appropriate  CRANIAL NERVE:  2nd, 3rd, 4th, 6th - pupils equal and reactive to light, visual fields full to confrontation, extraocular muscles intact, no nystagmus 5th - mildly decreased sensation left V1, V3 7th - mild upper and lower facial droop on left 8th - hearing  intact 9th - palate elevates symmetrically, uvula midline 11th - shoulder shrug symmetric 12th - tongue protrusion midline  MOTOR:  normal bulk and tone, full strength in the BUE, BLE  SENSORY:  normal and symmetric to light touch all 4 extremities  COORDINATION:  finger-nose-finger intact  REFLEXES:  deep tendon reflexes present and symmetric  GAIT/STATION:  normal     DIAGNOSTIC DATA (LABS, IMAGING, TESTING) - I reviewed patient records, labs, notes, testing and imaging myself where available.  Lab Results  Component Value Date   WBC 16.7 (H) 05/23/2021   HGB 11.4 (L) 05/23/2021   HCT 33.9 (L) 05/23/2021   MCV 87.8 05/23/2021   PLT 275 05/23/2021      Component Value Date/Time   NA 137 05/23/2021 0428   K 4.0 05/23/2021 0428   CL 107  05/23/2021 0428   CO2 21 (L) 05/23/2021 0428   GLUCOSE 128 (H) 05/23/2021 0428   BUN 6 05/23/2021 0428   CREATININE 0.76 05/23/2021 0428   CREATININE 0.67 05/04/2021 1035   CALCIUM 8.9 05/23/2021 0428   PROT 5.3 (L) 05/23/2021 0428   ALBUMIN 2.3 (L) 05/23/2021 0428   AST 19 05/23/2021 0428   AST 13 (L) 05/04/2021 1035   ALT 12 05/23/2021 0428   ALT 9 05/04/2021 1035   ALKPHOS 170 (H) 05/23/2021 0428   BILITOT 0.5 05/23/2021 0428   BILITOT 0.2 (L) 05/04/2021 1035   GFRNONAA >60 05/23/2021 0428   GFRNONAA >60 05/04/2021 1035   Lab Results  Component Value Date   CHOL 316 (H) 05/19/2021   HDL 111 05/19/2021   LDLCALC 161 (H) 05/19/2021   TRIG 219 (H) 05/19/2021   CHOLHDL 2.8 05/19/2021   Lab Results  Component Value Date   HGBA1C 5.4 05/19/2021   No results found for: VITAMINB12 No results found for: TSH    ASSESSMENT AND PLAN  36 y.o. year old female with a history of Factor V Leiden deficiency, DVT/PE, HLD, migraine who presents for Bell's Palsy. Her facial movement and taste have been improving slowly over time though they are not yet back to baseline. Discussed that it can take 3-4 months for Bell's Palsy to  recover. Will refer to Ophthalmology for evaluation of eye burning to make sure she has not developed a corneal abrasion. Discussed that arm tingling is not typically associated with Bell's Palsy but may be a sign of neck issues as she endorses shooting pain down her arms. She would like to hold off on further evaluation of this for now. Will follow up in 2-3 months to assess for further facial recovery and can readdress arm tingling at that time.   1. Bell's palsy       PLAN: -Referral to Ophthalmology -Encouraged to tape eye at night and use artificial tears for eye dryness -Will reassess facial function in 2-3 months -next steps: Consider MRI C-spine if arm tingling/numbness persists  Orders Placed This Encounter  Procedures   Ambulatory referral to Ophthalmology     Return in about 2 months (around 08/27/2021).    Ocie Doyne, MD 06/29/21 10:00 AM  I spent an average of 29 minutes chart reviewing and counseling the patient, with at least 50% of the time face to face with the patient.   [facility] [address] [place], [place] [address] [phone]

## 2021-06-29 ENCOUNTER — Encounter: Payer: Self-pay | Admitting: Psychiatry

## 2021-06-29 ENCOUNTER — Ambulatory Visit: Payer: Medicaid Other | Admitting: Psychiatry

## 2021-06-29 VITALS — BP 122/84 | HR 73 | Ht 62.5 in | Wt 206.2 lb

## 2021-06-29 DIAGNOSIS — G510 Bell's palsy: Secondary | ICD-10-CM

## 2021-06-29 NOTE — Patient Instructions (Addendum)
Plan: Referral to Ophthalmology  BELL'S PALSY Bell's palsy is a condition in which the nerve that controls the muscles of the face becomes injured or even stops working altogether. This causes the facial muscles to become weak or paralyzed. Common signs of Bell's palsy are weakness of muscles on one side of the face, drooping eyelid or mouth on one side, or drooling from one side of the mouth   Bell's palsy can occur at any age, and it affects all races and both sexes equally. Diabetes and pregnancy increase the risk of developing Bell's palsy. Most people with Bell's palsy recover completely. A minority of people continue to have some symptoms for life. If you have any signs of Bell's palsy, you should see a doctor or nurse because treatment is effective but should be started within two to three days of onset.   BELL'S PALSY CAUSES Bell's palsy is caused by an inflammation of the facial nerve. This inflammation may be caused by a virus. There is some evidence that the virus is often herpes simplex virus (HSV), the same virus that causes cold sores and genital herpes.   The inflammation causes swelling of the nerve that controls movement of one side of the face. The nerve, as well as adjacent blood vessels that supply the nerve with blood, must travel through a narrow canal surrounded by bone. As the nerve swells, it becomes compressed (pinched) and its protective covering breaks down, interfering with the nerve's ability to communicate with the muscles. This causes weakness or paralysis of the muscles in one side of the face. Weakness of these muscles can make it difficult to smile or close the eye.  BELL'S PALSY SYMPTOMS Bell's palsy causes one side of the face to be partly or completely paralyzed. This may cause: ?Eyebrow sagging ?Drooping of the lower part of the face, especially the corner of the mouth ?One eye will not close completely ] In some cases, you may lose the normal ability to  close one eye, which can lead to drying of the surface of the eye (the cornea). Loud noises may be amplified and cause discomfort in the ear on the affected side, a condition called hyperacusis. In addition, some people lose the sense of taste on the front of the tongue. The changes caused by Bell's palsy will affect the appearance of your face, including how you smile. These changes are often obvious to others and can cause people with Bell's palsy to feel distressed and to avoid social activities. The symptoms of Bell's palsy usually appear over a period of a day or two. Most patients begin to improve within three weeks after the first symptoms begin. Improvement may continue for three to six months.  BELL'S PALSY TESTS Bell's palsy is usually diagnosed based on your symptoms and a physical examination. Blood and other tests are not usually needed, but doctors often test for Lyme disease, a treatable infection that can cause weakness of the face.  BELL'S PALSY TREATMENT There is no cure for Bell's palsy, but treatment can help you to get better faster, especially if you can start treatment within the first few days. However, you may not have to be treated for Bell's palsy if your symptoms are mild. Talk to your doctor or nurse to ask if you should be treated.  Eye care -- You will need eye treatments if you cannot close your eye. If the cornea, which is the clear protective covering of the pupil, becomes overly dry, there is  a risk of scarring and permanent damage to the eye. You can use artificial tears (eye drops) as often as every hour during the day to keep the eye moist. A moisturizing eye ointment is typically used at night. You can use the ointment during the day, although it will make your vision blurry.  If your eye does not close completely, you should protect it during the day with glasses or goggles. Use a patch over your eye at night, but be sure not to use tape on your eyelid since the  patch could slip and scratch the cornea.  Medications -- Most people who are diagnosed with Bell's palsy (within two to three days of the first symptoms) are treated with steroids (eg, prednisone) for one week. Steroids, also called glucocorticoids, can reduce swelling in the nerve and improve your chances of recovering completely. These medicines work best when started early (within three days of the first symptoms). Antiviral medicines (eg, valacyclovir, Valtrex) are sometimes used in conjunction with glucocorticoids, especially when the facial weakness is severe. Some controlled trials have found an added benefit from the use of these agents in patients with very severe facial paralysis. Other forms of treatment are unproven.  Facial nerve palsy caused by Lyme disease is treated with antibiotics. Steroids can be used with antibiotics, but there is no proof that steroids are of benefit when Lyme disease is the cause of the facial weakness.   Monitoring -- You will need a follow-up visit with your doctor or nurse after you start treatment. At this visit, you will have an examination and you can discuss any questions or problems.  RECOVERY FROM BELL'S PALSY In general, people whose paralysis is less severe tend to recover more completely. If symptoms begin to improve within the first 21 days, the chances are also good that you will recover and have little or no remaining weakness in the muscles of the face. However, a small number of people are left with moderate to severe muscle weakness that is permanent.   If the damage to the nerve is severe, it may heal and grow back in a disorganized fashion. When this happens, your ability to control separate facial movements may be lost. For example: ?When you blink, your mouth may twitch ?Smiling may cause your eye to close ?When you salivate (eg, before eating), tears may flow from one eye  A second or third attack of Bell's palsy is uncommon but has been  reported in 7 to 15 percent of people.

## 2021-06-30 ENCOUNTER — Telehealth: Payer: Self-pay | Admitting: Psychiatry

## 2021-06-30 NOTE — Telephone Encounter (Signed)
Ophthalmology referral has been sent to [facility]. Phone: [phone]. °

## 2021-07-06 ENCOUNTER — Other Ambulatory Visit: Payer: Self-pay | Admitting: Hematology and Oncology

## 2021-07-06 ENCOUNTER — Inpatient Hospital Stay: Payer: Medicaid Other | Attending: Hematology and Oncology

## 2021-07-06 ENCOUNTER — Inpatient Hospital Stay: Payer: Medicaid Other | Admitting: Hematology and Oncology

## 2021-07-06 DIAGNOSIS — I8290 Acute embolism and thrombosis of unspecified vein: Secondary | ICD-10-CM

## 2021-07-06 NOTE — Progress Notes (Deleted)
[name] [phone]   [phone]  PROGRESS NOTE  Patient Care Team: Pcp, No as PCP - General [name], MD as PCP - Cardiology (Cardiology)  Hematological/Oncological History # Heterozygous Factor V Leiden with Hx of VTE/PE # Pregnancy, Due Date 05/29/21 08/2019: patient diagnosed with LLE DVT and PE while in [place]. Started on Xarelto therapy. Recommended indefinite anticoagulation. Diagnosed with heterozygous FVL 03/11/2021: establish care with Dr. Lorenso Courier   Interval History:  Christine Wong 36 y.o. female with medical history significant for heterozygous factor V Leiden with a history of VTE/PE who presents for a follow up visit. The patient's last visit was on 03/11/2021 at which time she established care. In the interim since the last visit she is continued on daily Lovenox therapy.  On exam today [name] reports she has been well in the interim since her last visit.  She has been taking the Lovenox therapy as prescribed once daily with a shot in the abdomen.  She notes she is not had any issues with bleeding or bruising while on blood thinner.  She is also had no blood in her bowel movements.  She reports about 2 days ago she developed "excruciating leg pain".  She notes that there was some swelling at the time but the symptoms have subsequently decreased in severity.  She notes also some swelling in her hands.  She otherwise denies any fevers, chills, sweats, nausea, vomiting or diarrhea.  Full 10 point ROS is listed below.  MEDICAL HISTORY:  Past Medical History:  Diagnosis Date   Anxiety    DVT (deep venous thrombosis) ([place]) 08/2019   HPV (human papilloma virus) infection    Seasonal allergies    Vaginal Pap smear, abnormal     SURGICAL HISTORY: Past Surgical History:  Procedure Laterality Date   CESAREAN SECTION N/A 05/22/2021   Procedure: CESAREAN SECTION;  Surgeon: Linda Hedges, DO;  Location: [facility];  Service: Obstetrics;   Laterality: N/A;   WISDOM TOOTH EXTRACTION  2014    SOCIAL HISTORY: Social History   Socioeconomic History   Marital status: Single    Spouse name: Not on file   Number of children: Not on file   Years of education: Not on file   Highest education level: Not on file  Occupational History   Not on file  Tobacco Use   Smoking status: Former    Types: Cigarettes    Quit date: 2009    Years since quitting: 14.0   Smokeless tobacco: Never  Vaping Use   Vaping Use: Never used  Substance and Sexual Activity   Alcohol use: Yes    Comment: socially   Drug use: Yes    Types: Marijuana    Comment: socially last use 9 months ago   Sexual activity: Yes    Partners: Female    Birth control/protection: None  Other Topics Concern   Not on file  Social History Narrative   Not on file   Social Determinants of Health   Financial Resource Strain: Not on file  Food Insecurity: Not on file  Transportation Needs: Not on file  Physical Activity: Not on file  Stress: Not on file  Social Connections: Not on file  Intimate Partner Violence: Not on file    FAMILY HISTORY: Family History  Problem Relation Age of Onset   Hypertension Mother    Breast cancer Mother    Diabetes Father    Kidney failure Father  Heart disease Father    Hypertension Father    Pancreatic cancer Maternal Grandmother    Hypertension Maternal Grandmother    Breast cancer Paternal Grandmother    Ovarian cancer Paternal Grandmother    Colon cancer Maternal Uncle     ALLERGIES:  is allergic to peanut-containing drug products and penicillins.  MEDICATIONS:  Current Outpatient Medications  Medication Sig Dispense Refill   enoxaparin (LOVENOX) 40 MG/0.4ML injection Inject 1.5 mLs (150 mg total) into the skin daily. 135 mL 0   ferrous sulfate 325 (65 FE) MG tablet Take 325 mg by mouth daily with breakfast.     oxyCODONE (OXY IR/ROXICODONE) 5 MG immediate release tablet Take 1 tablet (5 mg total) by mouth  every 4 (four) hours as needed for severe pain. (Patient not taking: Reported on 06/29/2021) 15 tablet 0   Prenat-Fe Poly-Methfol-FA-DHA (VITAFOL ULTRA) 29-0.6-0.4-200 MG CAPS Take 1 tablet by mouth daily. (Patient not taking: Reported on 06/29/2021) 30 capsule 12   No current facility-administered medications for this visit.    REVIEW OF SYSTEMS:   Constitutional: ( - ) fevers, ( - )  chills , ( - ) night sweats Eyes: ( - ) blurriness of vision, ( - ) double vision, ( - ) watery eyes Ears, nose, mouth, throat, and face: ( - ) mucositis, ( - ) sore throat Respiratory: ( - ) cough, ( - ) dyspnea, ( - ) wheezes Cardiovascular: ( - ) palpitation, ( - ) chest discomfort, ( - ) lower extremity swelling Gastrointestinal:  ( - ) nausea, ( - ) heartburn, ( - ) change in bowel habits Skin: ( - ) abnormal skin rashes Lymphatics: ( - ) new lymphadenopathy, ( - ) easy bruising Neurological: ( - ) numbness, ( - ) tingling, ( - ) new weaknesses Behavioral/Psych: ( - ) mood change, ( - ) new changes  All other systems were reviewed with the patient and are negative.  PHYSICAL EXAMINATION: ECOG PERFORMANCE STATUS: 0 - Asymptomatic  There were no vitals filed for this visit.  There were no vitals filed for this visit.   GENERAL: Well appearing pregnant African-American female, alert, no distress and comfortable SKIN: skin color, texture, turgor are normal, no rashes or significant lesions EYES: conjunctiva are pink and non-injected, sclera clear LUNGS: clear to auscultation and percussion with normal breathing effort HEART: regular rate & rhythm and no murmurs and no lower extremity edema ABDOMEN: Gravid uterus Musculoskeletal: no cyanosis of digits and no clubbing  PSYCH: alert & oriented x 3, fluent speech NEURO: no focal motor/sensory deficits  LABORATORY DATA:  I have reviewed the data as listed CBC Latest Ref Rng & Units 05/23/2021 05/23/2021 05/22/2021  WBC 4.0 - 10.5 K/uL 16.7(H) 20.0(H)  14.1(H)  Hemoglobin 12.0 - 15.0 g/dL 11.4(L) 12.1 13.7  Hematocrit 36.0 - 46.0 % 33.9(L) 36.2 41.6  Platelets 150 - 400 K/uL 275 295 390    CMP Latest Ref Rng & Units 05/23/2021 05/19/2021 05/19/2021  Glucose 70 - 99 mg/dL 128(H) 70 69(L)  BUN 6 - 20 mg/dL 6 5(L) 6  Creatinine 0.44 - 1.00 mg/dL 0.76 0.60 0.66  Sodium 135 - 145 mmol/L 137 134(L) 134(L)  Potassium 3.5 - 5.1 mmol/L 4.0 4.5 4.6  Chloride 98 - 111 mmol/L 107 105 103  CO2 22 - 32 mmol/L 21(L) - 23  Calcium 8.9 - 10.3 mg/dL 8.9 - 10.0  Total Protein 6.5 - 8.1 g/dL 5.3(L) - 6.7  Total Bilirubin 0.3 - 1.2 mg/dL 0.5 -  0.3  Alkaline Phos 38 - 126 U/L 170(H) - 239(H)  AST 15 - 41 U/L 19 - 16  ALT 0 - 44 U/L 12 - 13    RADIOGRAPHIC STUDIES: No results found.    ASSESSMENT & PLAN Amiracle Rzonca 36 y.o. female with medical history significant for heterozygous factor V Leiden with a history of VTE/PE who presents for a follow up visit.   After review of the labs, review of the records, and discussion with the patient the patients findings are most consistent with an unprovoked VTE in the setting of heterozygous factor V Leiden.  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 This is preferable if patient desires daily dosing. Can continue this dosing regimen --Patient reported that her OB provider was considering switching her over to a therapeutic dose subcutaneous heparin.  This would be a reasonable option if they would prefer this given how close she is to delivery. --after 6 weeks postpartum can transition back to Xarelto 20mg  PO daily. This should be safe with breast feeding --patient will require indefinite anticoagulation, though we can discuss decreasing Xarelto to maintenance dosing at a later time.   --RTC in approximately 9 weeks (6 weeks post partum)   # Lower Extremity Pain -- Repeat ultrasound of the lower extremity performed, no evidence of acute DVT --Potentially secondary to muscle cramp given his transient nature. --Strict return precautions for any new or worsening symptoms lower extremity.   No orders of the defined types were placed in this encounter.    All questions were answered. The patient knows to call the clinic with any problems, questions or concerns.  A total of more than 30 minutes were spent on this encounter with face-to-face time and non-face-to-face time, including preparing to see the patient, ordering tests and/or medications, counseling the patient and coordination of care as outlined above.   Ledell Peoples, MD Department of Hematology/Oncology [place] [facility] Phone: [phone] Pager: [phone] Email: [email or web address]  07/06/2021 7:06 AM

## 2021-08-17 ENCOUNTER — Ambulatory Visit: Payer: Medicaid Other | Admitting: Internal Medicine

## 2021-08-17 NOTE — Progress Notes (Unsigned)
Cardiology Office Note:    Date:  08/17/2021   ID:  Christine Wong, DOB 09-09-1985, MRN [id number]  PCP:  Pcp, No   [facility] Providers Cardiologist:  Maisie Fus, MD     Referring MD: Medicine, [facility]*   No chief complaint on file. 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  Christine Wong comes in with palpitations. She reports she is in the middle of something and she has to catch her breath. It feels tight. Her heart pumps fast. She reports some LH. No syncope. She gets sharp pains. She does not have limiting symptoms. She can do her daily activities. She has some SOB sometimes.She is transitioning from lovenox to heparin currently with hx of DCT/PE. She has some ankle swelling that is chronic since before her pregnancy. 05/04/2021 Duplex negative in the LE. She was recently let go from her job 04/20/2021.  She moved down here from [place]. She's been here since July. She was at [facility] prior to this for her OB work up. She follows with OB here, Dr. Parke Poisson.  She does not have high risk features including syncope c/f arrhythmia , family hx of SCD, or abnormalities on her EKG.   Father had hx of heart disease  She's a former smoker ages [age].   05/04/2021 Hgb- 12.3 Plt 324 Crt 0.67     Past Medical History:  Diagnosis Date   Anxiety    DVT (deep venous thrombosis) (HCC) 08/2019   HPV (human papilloma virus) infection    Seasonal allergies    Vaginal Pap smear, abnormal     Past Surgical History:  Procedure Laterality Date   CESAREAN SECTION N/A 05/22/2021   Procedure: CESAREAN SECTION;  Surgeon: Mitchel Honour, DO;  Location: [facility] LD [facility]S;  Service: Obstetrics;  Laterality: N/A;   WISDOM TOOTH EXTRACTION  2014     Current Medications: No outpatient medications have been marked as taking for the 08/17/21 encounter (Appointment) with Maisie Fus, MD.     Allergies:   Peanut-containing drug products and Penicillins   Social History   Socioeconomic History   Marital status: Single    Spouse name: Not on file   Number of children: Not on file   Years of education: Not on file   Highest education level: Not on file  Occupational History   Not on file  Tobacco Use   Smoking status: Former    Types: Cigarettes    Quit date: 2009    Years since quitting: 14.1   Smokeless tobacco: Never  Vaping Use   Vaping Use: Never used  Substance and Sexual Activity   Alcohol use: Yes    Comment: socially   Drug use: Yes    Types: Marijuana    Comment: socially last use 9 months ago   Sexual activity: Yes    Partners: Wong    Birth control/protection: None  Other Topics Concern   Not on file  Social History Narrative   Not on file   Social Determinants of Health   Financial Resource Strain: Not on file  Food Insecurity: Not on file  Transportation Needs: Not on file  Physical Activity: Not on file  Stress: Not on file  Social Connections: Not on file     Family History: The patient's family history includes Breast cancer in her mother and paternal grandmother; Colon cancer in her maternal uncle; Diabetes in her father; Heart disease in her father; Hypertension in her father, maternal grandmother, and mother; Kidney failure in her father; Ovarian cancer in her paternal grandmother; Pancreatic cancer in her maternal grandmother.  ROS:   Please see the history of present illness.     All other systems reviewed and are negative.  EKGs/Labs/Other Studies Reviewed:    The following studies were reviewed today:   EKG:  EKG is  ordered today.  The ekg ordered today demonstrates   Sinus  tachycardia 129 bpm, no pre-excitation, no ischemic changes. Qtc  Recent Labs: 05/23/2021:  ALT 12; BUN 6; Creatinine, Ser 0.76; Hemoglobin 11.4; Platelets 275; Potassium 4.0; Sodium 137  Recent Lipid Panel    Component Value Date/Time   CHOL 316 (H) 05/19/2021 1852   TRIG 219 (H) 05/19/2021 1852   HDL 111 05/19/2021 1852   CHOLHDL 2.8 05/19/2021 1852   VLDL 44 (H) 05/19/2021 1852   LDLCALC 161 (H) 05/19/2021 1852     Risk Assessment/Calculations:           Physical Exam:    VS:  There were no vitals filed for this visit.      There were no vitals taken for this visit.    Wt Readings from Last 3 Encounters:  06/29/21 206 lb 4 oz (93.6 kg)  05/19/21 228 lb (103.4 kg)  05/07/21 226 lb 6.4 oz (102.7 kg)     GEN:  Well nourished, well developed in no acute distress HEENT: Normal NECK: No JVD;  LYMPHATICS: No lymphadenopathy CARDIAC:tachycardic , no murmurs, rubs, gallops RESPIRATORY:  Clear to auscultation without rales, wheezing or rhonchi  ABDOMEN: Soft, non-tender, non-distended MUSCULOSKELETAL:  mild non pitting edema BL LE; No deformity  SKIN: Warm and dry NEUROLOGIC:  Alert and oriented x 3 PSYCHIATRIC:  Normal affect   ASSESSMENT:    #Sinus Tachycardia: She is symptomatic with palpitations and some sob. Although her duplex was normal, PE is still a possibility. Although cardiac output and HR can increase with pregnancy, her rates are fairly high at 129 bpm. She is hypercoagulable with Factor V and pregnancy. Her management would not necessarily change; however it's important to identify if she is having RV strain if she does have a PE.  PLAN:    In order of problems listed above:   VQ scan TTE Follow up in 3 months      Medication Adjustments/Labs and Tests Ordered: Current medicines are reviewed at length with the patient today.  Concerns regarding medicines are outlined above.   Signed, Maisie Fus, MD  08/17/2021 8:00 AM    [facility]

## 2021-09-16 NOTE — Progress Notes (Deleted)
? ?  CC:  Bell's palsy ? ?Follow-up Visit ? ?Last visit: 06/29/21 ? ?Brief HPI: ?36 year old female who follows in clinic for left sided Bell's Palsy (05/20/21) and paresthesias/shooting pains in her upper extremities. ? ?At her last visit she was referred to Ophthalmology for blurred vision on the left. ?Interval History: ?*** ? ? ?Physical Exam:  ? ?Vital Signs: ?There were no vitals taken for this visit. ?GENERAL:  well appearing, in no acute distress, alert  ?SKIN:  Color, texture, turgor normal. No rashes or lesions ?HEAD:  Normocephalic/atraumatic. ?RESP: normal respiratory effort ?MSK:  No gross joint deformities.  ? ?NEUROLOGICAL: ?Mental Status: Alert, oriented to person, place and time, Follows commands, and Speech fluent and appropriate. ?Cranial Nerves: PERRL, face symmetric, no dysarthria, hearing grossly intact ?Motor: moves all extremities equally ?Gait: normal-based. ? ?IMPRESSION: ?*** ? ?PLAN: ?*** ? ? ?Follow-up: *** ? ?I spent a total of *** minutes on the date of the service. Discussed medication side effects, adverse reactions and drug interactions. Written educational materials and patient instructions outlining all of the above were given. ? ?Ocie Doyne, MD ? ?

## 2021-09-17 ENCOUNTER — Ambulatory Visit: Payer: Medicaid Other | Admitting: Psychiatry

## 2021-09-18 ENCOUNTER — Other Ambulatory Visit: Payer: Self-pay

## 2021-09-18 ENCOUNTER — Inpatient Hospital Stay (HOSPITAL_BASED_OUTPATIENT_CLINIC_OR_DEPARTMENT_OTHER): Payer: Medicaid Other | Admitting: Hematology and Oncology

## 2021-09-18 ENCOUNTER — Inpatient Hospital Stay: Payer: Medicaid Other | Attending: Hematology and Oncology

## 2021-09-18 VITALS — BP 115/79 | HR 66 | Temp 97.7°F | Wt 215.9 lb

## 2021-09-18 DIAGNOSIS — I8290 Acute embolism and thrombosis of unspecified vein: Secondary | ICD-10-CM

## 2021-09-18 DIAGNOSIS — D500 Iron deficiency anemia secondary to blood loss (chronic): Secondary | ICD-10-CM

## 2021-09-18 DIAGNOSIS — D6851 Activated protein C resistance: Secondary | ICD-10-CM | POA: Insufficient documentation

## 2021-09-18 LAB — CBC WITH DIFFERENTIAL (CANCER CENTER ONLY)
Abs Immature Granulocytes: 0.02 10*3/uL (ref 0.00–0.07)
Basophils Absolute: 0.1 10*3/uL (ref 0.0–0.1)
Basophils Relative: 1 %
Eosinophils Absolute: 0.4 10*3/uL (ref 0.0–0.5)
Eosinophils Relative: 5 %
HCT: 39.3 % (ref 36.0–46.0)
Hemoglobin: 13.1 g/dL (ref 12.0–15.0)
Immature Granulocytes: 0 %
Lymphocytes Relative: 28 %
Lymphs Abs: 1.9 10*3/uL (ref 0.7–4.0)
MCH: 28.7 pg (ref 26.0–34.0)
MCHC: 33.3 g/dL (ref 30.0–36.0)
MCV: 86.2 fL (ref 80.0–100.0)
Monocytes Absolute: 0.7 10*3/uL (ref 0.1–1.0)
Monocytes Relative: 10 %
Neutro Abs: 3.8 10*3/uL (ref 1.7–7.7)
Neutrophils Relative %: 56 %
Platelet Count: 395 10*3/uL (ref 150–400)
RBC: 4.56 MIL/uL (ref 3.87–5.11)
RDW: 13.1 % (ref 11.5–15.5)
WBC Count: 6.9 10*3/uL (ref 4.0–10.5)
nRBC: 0 % (ref 0.0–0.2)

## 2021-09-18 LAB — CMP (CANCER CENTER ONLY)
ALT: 10 U/L (ref 0–44)
AST: 13 U/L — ABNORMAL LOW (ref 15–41)
Albumin: 4 g/dL (ref 3.5–5.0)
Alkaline Phosphatase: 126 U/L (ref 38–126)
Anion gap: 5 (ref 5–15)
BUN: 12 mg/dL (ref 6–20)
CO2: 28 mmol/L (ref 22–32)
Calcium: 9.3 mg/dL (ref 8.9–10.3)
Chloride: 105 mmol/L (ref 98–111)
Creatinine: 0.82 mg/dL (ref 0.44–1.00)
GFR, Estimated: >60 mL/min (ref >60)
Glucose, Bld: 97 mg/dL (ref 70–99)
Potassium: 4.4 mmol/L (ref 3.5–5.1)
Sodium: 138 mmol/L (ref 135–145)
Total Bilirubin: 0.4 mg/dL (ref 0.3–1.2)
Total Protein: 7.6 g/dL (ref 6.5–8.1)

## 2021-09-18 NOTE — Progress Notes (Signed)
?[facility] ?[phone]   Fax:([phone]) [phone] ? ?PROGRESS NOTE ? ?Patient Care Team: ?Pcp, No as PCP - General ?[name], MD as PCP - Cardiology (Cardiology) ? 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 Started on Xarelto therapy. Recommended indefinite anticoagulation. Diagnosed with heterozygous FVL ?03/11/2021: establish care with Dr. Lorenso Courier  ? ?Interval History:  ?Christine Wong 36 y.o. female with medical history significant for heterozygous factor V Leiden with a history of VTE/PE who presents for a follow up visit. The patient's last visit was on 05/04/2021. In the interim since the last visit she is continued on daily Lovenox therapy. ? ?On exam today Christine Wong reports her baby is now 79 months old.  She reports that she has been feeling fatigued but she feels like it is "new mom tired".  She notes that her baby is currently sleeping in the bed with her and she has not been sleeping as well.  Patient notes that she is not having any shortness of breath or dizziness.  She denies any overt signs of bleeding or bruising.  She does that she is eating well.  She continues to take her Lovenox as prescribed.  She does occasionally take her iron pills but notes that they are sometimes "tough to remember".  She otherwise denies any fevers, chills, sweats, nausea, vomiting or diarrhea.  Full 10 point ROS is listed below. ? ?We had a discussion about the safety of Xarelto or other DOAC therapy while breast-feeding, but the patient is that she would like to continue Lovenox therapy due to the good data behind continued use with breast-feeding.  Recommend continuation of Lovenox at this time. ? ?MEDICAL HISTORY:  ?Past Medical History:  ?Diagnosis Date  ? Anxiety   ? DVT (deep venous thrombosis) ([place]) 08/2019  ? HPV (human papilloma virus) infection   ?  Seasonal allergies   ? Vaginal Pap smear, abnormal   ? ? ?SURGICAL HISTORY: ?Past Surgical History:  ?Procedure Laterality Date  ? CESAREAN SECTION N/A 05/22/2021  ? Procedure: CESAREAN SECTION;  Surgeon: Linda Hedges, DO;  Location: [facility] LD [facility]S;  Service: Obstetrics;  Laterality: N/A;  ? WISDOM TOOTH EXTRACTION  2014  ? ? ?SOCIAL HISTORY: ?Social History  ? ?Socioeconomic History  ? Marital status: Single  ?  Spouse name: Not on file  ? Number of children: Not on file  ? Years of education: Not on file  ? Highest education level: Not on file  ?Occupational History  ? Not on file  ?Tobacco Use  ? Smoking status: Former  ?  Types: Cigarettes  ?  Quit date: 2009  ?  Years since quitting: 14.2  ? Smokeless tobacco: Never  ?Vaping Use  ? Vaping Use: Never used  ?Substance and Sexual Activity  ? Alcohol use: Yes  ?  Comment: socially  ? Drug use: Yes  ?  Types: Marijuana  ?  Comment: socially last use 9 months ago  ? Sexual activity: Yes  ?  Partners: Female  ?  Birth control/protection: None  ?Other Topics Concern  ? Not on file  ?Social History Narrative  ? Not on file  ? ?Social Determinants of Health  ? ?Financial Resource Strain: Not on file  ?Food Insecurity: Not on file  ?Transportation Needs: Not on file  ?Physical Activity: Not on file  ?Stress: Not on file  ?Social  Connections: Not on file  ?Intimate Partner Violence: Not on file  ? ? ?FAMILY HISTORY: ?Family History  ?Problem Relation Age of Onset  ? Hypertension Mother   ? Breast cancer Mother   ? Diabetes Father   ? Kidney failure Father   ? Heart disease Father   ? Hypertension Father   ? Pancreatic cancer Maternal Grandmother   ? Hypertension Maternal Grandmother   ? Breast cancer Paternal Grandmother   ? Ovarian cancer Paternal Grandmother   ? Colon cancer Maternal Uncle   ? ? ?ALLERGIES:  is allergic to peanut-containing drug products and penicillins. ? ?MEDICATIONS:  ?Current Outpatient Medications  ?Medication Sig Dispense Refill  ? enoxaparin (LOVENOX)  40 MG/0.4ML injection Inject 1.5 mLs (150 mg total) into the skin daily. 135 mL 0  ? ferrous sulfate 325 (65 FE) MG tablet Take 325 mg by mouth daily with breakfast.    ? ?No current facility-administered medications for this visit.  ? ? ?REVIEW OF SYSTEMS:   ?Constitutional: ( - ) fevers, ( - )  chills , ( - ) night sweats ?Eyes: ( - ) blurriness of vision, ( - ) double vision, ( - ) watery eyes ?Ears, nose, mouth, throat, and face: ( - ) mucositis, ( - ) sore throat ?Respiratory: ( - ) cough, ( - ) dyspnea, ( - ) wheezes ?Cardiovascular: ( - ) palpitation, ( - ) chest discomfort, ( - ) lower extremity swelling ?Gastrointestinal:  ( - ) nausea, ( - ) heartburn, ( - ) change in bowel habits ?Skin: ( - ) abnormal skin rashes ?Lymphatics: ( - ) new lymphadenopathy, ( - ) easy bruising ?Neurological: ( - ) numbness, ( - ) tingling, ( - ) new weaknesses ?Behavioral/Psych: ( - ) mood change, ( - ) new changes  ?All other systems were reviewed with the patient and are negative. ? ?PHYSICAL EXAMINATION: ?ECOG PERFORMANCE STATUS: 0 - Asymptomatic ? ?Vitals:  ? 09/18/21 0944  ?BP: 115/79  ?Pulse: 66  ?Temp: 97.7 ?F (36.5 ?C)  ?SpO2: 98%  ? ?Filed Weights  ? 09/18/21 0944  ?Weight: 215 lb 14.4 oz (97.9 kg)  ? ? ?GENERAL: Well appearing pregnant African-American female, alert, no distress and comfortable ?SKIN: skin color, texture, turgor are normal, no rashes or significant lesions ?EYES: conjunctiva are pink and non-injected, sclera clear ?LUNGS: clear to auscultation and percussion with normal breathing effort ?HEART: regular rate & rhythm and no murmurs and no lower extremity edema ?Musculoskeletal: no cyanosis of digits and no clubbing  ?PSYCH: alert & oriented x 3, fluent speech ?NEURO: no focal motor/sensory deficits ? ?LABORATORY DATA:  ?I have reviewed the data as listed ? ?  Latest Ref Rng & Units 09/18/2021  ?  9:22 AM 05/23/2021  ?  4:28 AM 05/23/2021  ?  1:00 AM  ?CBC  ?WBC 4.0 - 10.5 K/uL 6.9   16.7   20.0     ?Hemoglobin 12.0 - 15.0 g/dL 13.1   11.4   12.1    ?Hematocrit 36.0 - 46.0 % 39.3   33.9   36.2    ?Platelets 150 - 400 K/uL 395   275   295    ? ? ? ?  Latest Ref Rng & Units 09/18/2021  ?  9:22 AM 05/23/2021  ?  4:28 AM 05/19/2021  ?  4:10 PM  ?CMP  ?Glucose 70 - 99 mg/dL 97   128   70    ?BUN 6 - 20 mg/dL 12  6   5    ?Creatinine 0.44 - 1.00 mg/dL 0.82   0.76   0.60    ?Sodium 135 - 145 mmol/L 138   137   134    ?Potassium 3.5 - 5.1 mmol/L 4.4   4.0   4.5    ?Chloride 98 - 111 mmol/L 105   107   105    ?CO2 22 - 32 mmol/L 28   21     ?Calcium 8.9 - 10.3 mg/dL 9.3   8.9     ?Total Protein 6.5 - 8.1 g/dL 7.6   5.3     ?Total Bilirubin 0.3 - 1.2 mg/dL 0.4   0.5     ?Alkaline Phos 38 - 126 U/L 126   170     ?AST 15 - 41 U/L 13   19     ?ALT 0 - 44 U/L 10   12     ? ? ?RADIOGRAPHIC STUDIES: ?No results found. ? ?  ?ASSESSMENT & PLAN ?Christine Wong 36 y.o. female with medical history significant for heterozygous factor V Leiden with a history of VTE/PE who presents for a follow up visit.  ? ?After review of the labs, review of the records, and discussion with the patient the patients findings are most consistent with an unprovoked VTE in the setting of heterozygous factor V Leiden. ? 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 This is preferable if patient desires daily dosing. Can continue this dosing regimen ?-- Patient notes that she would like to continue this due to the good data on Lovenox while breast-feeding.  Discussed that [place] therapy could be appropriate but the patient would like to continue Lovenox ?--Return to clinic in 6 months time to reevaluate. ? ?  ?No orders of the defined types were placed in this encounter. ? ? ? ?All questions were answered. The patient knows to call the clinic with any problems, questions or concerns. ? ?A  total of more than 30 minutes were spent on this encounter with face-to-face time and non-face-to-face time, including preparing to see the patient, ordering tests and/or medications, counseling the patient and coo

## 2021-09-21 NOTE — Progress Notes (Addendum)
? ?  CC:  Bell's Palsy ? ?Follow-up Visit ? ?Last visit: 06/29/21 ? ?Brief HPI: ?36 year old female who follows in clinic for left-sided Bell's palsy which began 05/20/21. ? ?At her last visit she was referred to ophthalmology for clouding and blurring of her vision. She also reported radicular arm pain and paresthesias at that time. ? ?Interval History: ?Since her last visit her facial movement has improved. She has a very mild residual asymmetric smile, otherwise her facial functioning appears to be back to baseline. She has been having intermittent twitching on of her left mouth and cheek which started 2 weeks ago. ? ?Her vision is back to normal without blurring or clouding. ? ?She will occasionally still get left hand paresthesias, but she is able to prevent these by positioning herself certain ways while she is sleeping. ? ? ?Physical Exam:  ? ?Vital Signs: ?BP 128/83   Pulse 86   Ht 5\' 2"  (1.575 m)   Wt 219 lb (99.3 kg)   BMI 40.06 kg/m?  ?GENERAL:  well appearing, in no acute distress, alert  ?SKIN:  Color, texture, turgor normal. No rashes or lesions ?HEAD:  Normocephalic/atraumatic. ?RESP: normal respiratory effort ?MSK:  No gross joint deformities.  ? ?NEUROLOGICAL: ?Mental Status: Alert, oriented to person, place and time, Follows commands, and Speech fluent and appropriate. ?Cranial Nerves: PERRL, mild left facial droop (asymmetric smile), no dysarthria, hearing grossly intact ?Motor: moves all extremities equally ?Gait: normal-based. ? ?IMPRESSION: ?36 year old female who presents for follow up of left sided Bell's Palsy. Her facial function is almost back to baseline, other than a mild residual asymmetric smile. She has recently developed intermittent twitching on the left side of her face. Discussed that facial twitching can develop with regeneration of the facial nerve. Currently this is not particularly bothersome to her. She would like to avoid medications as she is breastfeeding. Advised her to  try magnesium daily to help with muscle spasms. She will return to the office if twitching worsens or fails to improve. ? ?PLAN: ?-Start magnesium 400 mg daily for muscle spasms ?-Return to clinic if facial twitching worsens or fails to improve ? ? ?Follow-up: as needed ? ?I spent a total of 19 minutes on the date of the service. Written educational materials and patient instructions outlining all of the above were given. ? ?30, MD ?09/22/21 ?2:45 PM ? ?

## 2021-09-22 ENCOUNTER — Ambulatory Visit: Payer: Medicaid Other | Admitting: Psychiatry

## 2021-09-22 ENCOUNTER — Encounter: Payer: Self-pay | Admitting: Psychiatry

## 2021-09-22 VITALS — BP 128/83 | HR 86 | Ht 62.0 in | Wt 219.0 lb

## 2021-09-22 DIAGNOSIS — G510 Bell's palsy: Secondary | ICD-10-CM | POA: Diagnosis not present

## 2021-09-22 NOTE — Patient Instructions (Signed)
Can take magnesium 250-400 mg daily to help prevent facial twitching ?

## 2021-09-29 ENCOUNTER — Telehealth: Payer: Self-pay | Admitting: Hematology and Oncology

## 2021-09-29 NOTE — Telephone Encounter (Signed)
Per 4/11 in basket.  Called pt and pt confirmed appointments  ?

## 2021-12-03 ENCOUNTER — Other Ambulatory Visit: Payer: Self-pay | Admitting: Hematology and Oncology

## 2021-12-03 ENCOUNTER — Other Ambulatory Visit: Payer: Self-pay | Admitting: *Deleted

## 2021-12-03 MED ORDER — ENOXAPARIN SODIUM 40 MG/0.4ML IJ SOSY: 150.0000 mg | PREFILLED_SYRINGE | INTRAMUSCULAR | 0 refills | Status: DC

## 2022-01-13 ENCOUNTER — Other Ambulatory Visit: Payer: Self-pay | Admitting: Hematology and Oncology

## 2022-02-11 ENCOUNTER — Other Ambulatory Visit: Payer: Self-pay | Admitting: Hematology and Oncology

## 2022-02-15 ENCOUNTER — Other Ambulatory Visit: Payer: Self-pay | Admitting: *Deleted

## 2022-02-15 MED ORDER — ENOXAPARIN SODIUM 150 MG/ML IJ SOSY: 150.0000 mg | PREFILLED_SYRINGE | INTRAMUSCULAR | 3 refills | Status: DC

## 2022-03-31 ENCOUNTER — Inpatient Hospital Stay: Payer: Medicaid Other

## 2022-03-31 ENCOUNTER — Inpatient Hospital Stay: Payer: Medicaid Other | Attending: Hematology and Oncology | Admitting: Hematology and Oncology

## 2022-03-31 ENCOUNTER — Other Ambulatory Visit: Payer: Self-pay | Admitting: Hematology and Oncology

## 2022-03-31 DIAGNOSIS — Z86711 Personal history of pulmonary embolism: Secondary | ICD-10-CM | POA: Insufficient documentation

## 2022-03-31 DIAGNOSIS — Z7901 Long term (current) use of anticoagulants: Secondary | ICD-10-CM | POA: Insufficient documentation

## 2022-03-31 DIAGNOSIS — Z87891 Personal history of nicotine dependence: Secondary | ICD-10-CM | POA: Insufficient documentation

## 2022-03-31 DIAGNOSIS — I8290 Acute embolism and thrombosis of unspecified vein: Secondary | ICD-10-CM

## 2022-03-31 DIAGNOSIS — Z86718 Personal history of other venous thrombosis and embolism: Secondary | ICD-10-CM | POA: Insufficient documentation

## 2022-03-31 DIAGNOSIS — D6851 Activated protein C resistance: Secondary | ICD-10-CM | POA: Insufficient documentation

## 2022-04-14 ENCOUNTER — Inpatient Hospital Stay (HOSPITAL_BASED_OUTPATIENT_CLINIC_OR_DEPARTMENT_OTHER): Payer: Medicaid Other | Admitting: Hematology and Oncology

## 2022-04-14 ENCOUNTER — Inpatient Hospital Stay: Payer: Medicaid Other

## 2022-04-14 VITALS — BP 123/84 | HR 96 | Temp 98.0°F | Resp 17 | Wt 235.1 lb

## 2022-04-14 DIAGNOSIS — D500 Iron deficiency anemia secondary to blood loss (chronic): Secondary | ICD-10-CM | POA: Diagnosis not present

## 2022-04-14 DIAGNOSIS — Z86711 Personal history of pulmonary embolism: Secondary | ICD-10-CM | POA: Diagnosis not present

## 2022-04-14 DIAGNOSIS — Z7901 Long term (current) use of anticoagulants: Secondary | ICD-10-CM | POA: Diagnosis not present

## 2022-04-14 DIAGNOSIS — Z86718 Personal history of other venous thrombosis and embolism: Secondary | ICD-10-CM | POA: Diagnosis not present

## 2022-04-14 DIAGNOSIS — I8290 Acute embolism and thrombosis of unspecified vein: Secondary | ICD-10-CM | POA: Diagnosis not present

## 2022-04-14 DIAGNOSIS — Z87891 Personal history of nicotine dependence: Secondary | ICD-10-CM | POA: Diagnosis not present

## 2022-04-14 DIAGNOSIS — D6851 Activated protein C resistance: Secondary | ICD-10-CM | POA: Diagnosis not present

## 2022-04-14 LAB — CBC WITH DIFFERENTIAL (CANCER CENTER ONLY)
Abs Immature Granulocytes: 0.03 10*3/uL (ref 0.00–0.07)
Basophils Absolute: 0.1 10*3/uL (ref 0.0–0.1)
Basophils Relative: 1 %
Eosinophils Absolute: 0.4 10*3/uL (ref 0.0–0.5)
Eosinophils Relative: 5 %
HCT: 39.5 % (ref 36.0–46.0)
Hemoglobin: 13.1 g/dL (ref 12.0–15.0)
Immature Granulocytes: 0 %
Lymphocytes Relative: 18 %
Lymphs Abs: 1.6 10*3/uL (ref 0.7–4.0)
MCH: 28 pg (ref 26.0–34.0)
MCHC: 33.2 g/dL (ref 30.0–36.0)
MCV: 84.4 fL (ref 80.0–100.0)
Monocytes Absolute: 0.8 10*3/uL (ref 0.1–1.0)
Monocytes Relative: 9 %
Neutro Abs: 6.3 10*3/uL (ref 1.7–7.7)
Neutrophils Relative %: 67 %
Platelet Count: 445 10*3/uL — ABNORMAL HIGH (ref 150–400)
RBC: 4.68 MIL/uL (ref 3.87–5.11)
RDW: 13.2 % (ref 11.5–15.5)
WBC Count: 9.2 10*3/uL (ref 4.0–10.5)
nRBC: 0 % (ref 0.0–0.2)

## 2022-04-14 LAB — CMP (CANCER CENTER ONLY)
ALT: 13 U/L (ref 0–44)
AST: 16 U/L (ref 15–41)
Albumin: 3.9 g/dL (ref 3.5–5.0)
Alkaline Phosphatase: 126 U/L (ref 38–126)
Anion gap: 7 (ref 5–15)
BUN: 9 mg/dL (ref 6–20)
CO2: 26 mmol/L (ref 22–32)
Calcium: 9.1 mg/dL (ref 8.9–10.3)
Chloride: 107 mmol/L (ref 98–111)
Creatinine: 0.82 mg/dL (ref 0.44–1.00)
GFR, Estimated: >60 mL/min (ref >60)
Glucose, Bld: 93 mg/dL (ref 70–99)
Potassium: 4.3 mmol/L (ref 3.5–5.1)
Sodium: 140 mmol/L (ref 135–145)
Total Bilirubin: 0.3 mg/dL (ref 0.3–1.2)
Total Protein: 7.5 g/dL (ref 6.5–8.1)

## 2022-04-14 MED ORDER — ENOXAPARIN SODIUM 150 MG/ML IJ SOSY: 150.0000 mg | PREFILLED_SYRINGE | INTRAMUSCULAR | 1 refills | Status: DC

## 2022-04-14 NOTE — Progress Notes (Signed)
[phone]   [phone]  PROGRESS NOTE  Patient Care Team: Pcp, No as PCP - General [name], MD as PCP - Cardiology (Cardiology)  Hematological/Oncological History # Heterozygous Factor V Leiden with Hx of VTE/PE # VTE in Pregnancy  08/2019: patient diagnosed with LLE DVT and PE while in [place]. Started on Xarelto therapy. Recommended indefinite anticoagulation due to unprovoked nature of VTE. Diagnosed with heterozygous FVL at that time.  03/11/2021: establish care with Dr. Lorenso Courier   Interval History:  Christine Wong 36 y.o. female with medical history significant for heterozygous factor V Leiden with a history of VTE/PE who presents for a follow up visit. The patient's last visit was on 05/04/2021. In the interim since the last visit she is continued on daily Lovenox therapy.  On exam today Christine Wong reports her baby is now 18 months old and with her today.  She reports that she is moving to [place] this weekend and will need to establish with a hematology provider down there.  She reports that she has been breast-feeding "on and off".  She notes that she is now taking Lovenox dosages 2 shots at the same time as they were unable to provide her with the 1 large full dose.  She notes that she would like to continue breast-feeding for at least another 2 months.  She is currently on her period right now and she has had no menstrual cycles for 2 months time.  Her cycles are "longer not heavier".  Overall she feels well with no lightheadedness, dizziness, or shortness of breath.  She has the usual fatigue from being a new mother but nothing too difficult to manage.  She otherwise denies any fevers, chills, sweats, nausea, vomiting or diarrhea.  Full 10 point ROS is listed below.  Previously we had a discussion about the safety of Xarelto or other DOAC therapy while breast-feeding, but the patient is that she would like to continue  Lovenox therapy due to the good data behind continued use with breast-feeding.  Recommend continuation of Lovenox at this time.  MEDICAL HISTORY:  Past Medical History:  Diagnosis Date   Anxiety    DVT (deep venous thrombosis) ([facility]) 08/2019   HPV (human papilloma virus) infection    Seasonal allergies    Vaginal Pap smear, abnormal     SURGICAL HISTORY: Past Surgical History:  Procedure Laterality Date   CESAREAN SECTION N/A 05/22/2021   Procedure: CESAREAN SECTION;  Surgeon: Linda Hedges, DO;  Location: [facility] LD ORS;  Service: Obstetrics;  Laterality: N/A;   WISDOM TOOTH EXTRACTION  2014    SOCIAL HISTORY: Social History   Socioeconomic History   Marital status: Single    Spouse name: Not on file   Number of children: Not on file   Years of education: Not on file   Highest education level: Not on file  Occupational History   Not on file  Tobacco Use   Smoking status: Former    Types: Cigarettes    Quit date: 2009    Years since quitting: 14.8   Smokeless tobacco: Never  Vaping Use   Vaping Use: Never used  Substance and Sexual Activity   Alcohol use: Yes    Comment: socially   Drug use: Yes    Types: Marijuana    Comment: socially last use 9 months ago   Sexual activity: Yes    Partners: Female    Birth control/protection: None  Other Topics  Concern   Not on file  Social History Narrative   Not on file   Social Determinants of Health   Financial Resource Strain: Not on file  Food Insecurity: Not on file  Transportation Needs: Not on file  Physical Activity: Not on file  Stress: Not on file  Social Connections: Not on file  Intimate Partner Violence: Not on file    FAMILY HISTORY: Family History  Problem Relation Age of Onset   Hypertension Mother    Breast cancer Mother    Diabetes Father    Kidney failure Father    Heart disease Father    Hypertension Father    Pancreatic cancer Maternal Grandmother    Hypertension Maternal Grandmother     Breast cancer Paternal Grandmother    Ovarian cancer Paternal Grandmother    Colon cancer Maternal Uncle     ALLERGIES:  is allergic to peanut-containing drug products and penicillins.  MEDICATIONS:  Current Outpatient Medications  Medication Sig Dispense Refill   enoxaparin (LOVENOX) 150 MG/ML injection Inject 1 mL (150 mg total) into the skin daily. 0 mL 3   ferrous sulfate 325 (65 FE) MG tablet Take 325 mg by mouth daily with breakfast.     No current facility-administered medications for this visit.    REVIEW OF SYSTEMS:   Constitutional: ( - ) fevers, ( - )  chills , ( - ) night sweats Eyes: ( - ) blurriness of vision, ( - ) double vision, ( - ) watery eyes Ears, nose, mouth, throat, and face: ( - ) mucositis, ( - ) sore throat Respiratory: ( - ) cough, ( - ) dyspnea, ( - ) wheezes Cardiovascular: ( - ) palpitation, ( - ) chest discomfort, ( - ) lower extremity swelling Gastrointestinal:  ( - ) nausea, ( - ) heartburn, ( - ) change in bowel habits Skin: ( - ) abnormal skin rashes Lymphatics: ( - ) new lymphadenopathy, ( - ) easy bruising Neurological: ( - ) numbness, ( - ) tingling, ( - ) new weaknesses Behavioral/Psych: ( - ) mood change, ( - ) new changes  All other systems were reviewed with the patient and are negative.  PHYSICAL EXAMINATION: ECOG PERFORMANCE STATUS: 0 - Asymptomatic  Vitals:   04/14/22 1530  BP: 123/84  Pulse: 96  Resp: 17  Temp: 98 F (36.7 C)  SpO2: 95%   Filed Weights   04/14/22 1530  Weight: 235 lb 1.6 oz (106.6 kg)    GENERAL: Well appearing pregnant African-American female, alert, no distress and comfortable SKIN: skin color, texture, turgor are normal, no rashes or significant lesions EYES: conjunctiva are pink and non-injected, sclera clear LUNGS: clear to auscultation and percussion with normal breathing effort HEART: regular rate & rhythm and no murmurs and no lower extremity edema Musculoskeletal: no cyanosis of digits and no  clubbing  PSYCH: alert & oriented x 3, fluent speech NEURO: no focal motor/sensory deficits  LABORATORY DATA:  I have reviewed the data as listed    Latest Ref Rng & Units 04/14/2022    3:06 PM 09/18/2021    9:22 AM 05/23/2021    4:28 AM  CBC  WBC 4.0 - 10.5 K/uL 9.2  6.9  16.7   Hemoglobin 12.0 - 15.0 g/dL 13.1  13.1  11.4   Hematocrit 36.0 - 46.0 % 39.5  39.3  33.9   Platelets 150 - 400 K/uL 445  395  275        Latest Ref Rng &  Units 09/18/2021    9:22 AM 05/23/2021    4:28 AM 05/19/2021    4:10 PM  CMP  Glucose 70 - 99 mg/dL 97  128  70   BUN 6 - 20 mg/dL 12  6  5    Creatinine 0.44 - 1.00 mg/dL 0.82  0.76  0.60   Sodium 135 - 145 mmol/L 138  137  134   Potassium 3.5 - 5.1 mmol/L 4.4  4.0  4.5   Chloride 98 - 111 mmol/L 105  107  105   CO2 22 - 32 mmol/L 28  21    Calcium 8.9 - 10.3 mg/dL 9.3  8.9    Total Protein 6.5 - 8.1 g/dL 7.6  5.3    Total Bilirubin 0.3 - 1.2 mg/dL 0.4  0.5    Alkaline Phos 38 - 126 U/L 126  170    AST 15 - 41 U/L 13  19    ALT 0 - 44 U/L 10  12      RADIOGRAPHIC STUDIES: No results found.    ASSESSMENT & PLAN Christine Wong 36 y.o. female with medical history significant for heterozygous factor V Leiden with a history of VTE/PE who presents for a follow up visit.   After review of the labs, review of the records, and discussion with the patient the patients findings are most consistent with an unprovoked VTE in the setting of heterozygous factor V Leiden.  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 This is preferable if patient desires daily dosing. Can continue this dosing regimen. Will provide refill today.  -- Patient notes that she would like to continue this due to the good data on Lovenox while breast-feeding.  She would like to continue for a full 12 months (2 more  months).  After that time she can transition back to indefinite Xarelto. --Return to clinic in 6 months time to reevaluate.  We will make a placeholder visit for this, however will get her in contact with the provider at [facility] in [place] to take over moving forward.    No orders of the defined types were placed in this encounter.  All questions were answered. The patient knows to call the clinic with any problems, questions or concerns.  A total of more than 25 minutes were spent on this encounter with face-to-face time and non-face-to-face time, including preparing to see the patient, ordering tests and/or medications, counseling the patient and coordination of care as outlined above.   Ledell Peoples, MD Department of Hematology/Oncology [place] [facility] Phone: [phone] Pager: [phone] Email: [email or web address]  04/14/2022 3:55 PM

## 2022-05-04 ENCOUNTER — Telehealth: Payer: Self-pay | Admitting: *Deleted

## 2022-05-04 NOTE — Telephone Encounter (Signed)
Faxed New Patient Referral to Dr. Nickie Retort at [facility] [place]. Patient moving to that area and requested to transfer care. Contacted De. Musselwhite's office @ [phone] and was given fax # [phone] for referral. Referral packet faxed and fax confirmation received.

## 2022-06-24 ENCOUNTER — Telehealth: Payer: Self-pay | Admitting: *Deleted

## 2022-06-24 ENCOUNTER — Other Ambulatory Visit: Payer: Self-pay | Admitting: *Deleted

## 2022-06-24 MED ORDER — RIVAROXABAN 20 MG PO TABS: 20.0000 mg | ORAL_TABLET | Freq: Every day | ORAL | 3 refills | Status: DC

## 2022-06-24 NOTE — Telephone Encounter (Signed)
She also states that she has stopped breast feeding and wants to resume her Xarelto and stop the lovenox. Discussed with Dr. Lorenso Courier. He is agreeable to this. Prescription for Xarelto sent to pt's pharmacy and she is aware to stop her lovenox injections. Pt voiced understanding. Scheduling message sent for lab/visit towards the end of the month.

## 2022-06-24 NOTE — Telephone Encounter (Signed)
Received call from pt. She states that she had planned to move to [place] but this has not happened. She wants to schedule lab and clinic visit this month with Dr. Lorenso Courier.

## 2022-07-19 ENCOUNTER — Other Ambulatory Visit: Payer: Self-pay | Admitting: Physician Assistant

## 2022-07-19 DIAGNOSIS — I8290 Acute embolism and thrombosis of unspecified vein: Secondary | ICD-10-CM

## 2022-07-19 DIAGNOSIS — D500 Iron deficiency anemia secondary to blood loss (chronic): Secondary | ICD-10-CM

## 2022-07-20 ENCOUNTER — Inpatient Hospital Stay: Payer: Medicaid Other | Attending: Physician Assistant

## 2022-07-20 ENCOUNTER — Inpatient Hospital Stay: Payer: Medicaid Other | Admitting: Physician Assistant

## 2022-11-09 ENCOUNTER — Other Ambulatory Visit: Payer: Self-pay | Admitting: Hematology and Oncology

## 2022-11-10 NOTE — Progress Notes (Signed)
Subjective:    Christine Wong - 37 y.o. female MRN [id number]  Date of birth: 1985-10-04  HPI  Christine Wong is to establish care. Reports in 2022 she moved from [place] to [place]. 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  Current issues and/or concerns: - Established with [facility] for women's health maintenance. Reports last appointment 10/15/2022 with the same and physical labs obtained (CMP, CBC, TSH, hemoglobin A1c, lipid, vitamin D, prolactin). Patient also had a pap test during the same visit. Patient has results for my review today on her phone. Labs were noted that patient is prediabetic. Patient would like to have CMP rechecked today.  - Established with Hematology Oncology. She is aware to keep all scheduled appointments. - Patient states she plans to have copy of medical records from specialists sent to our office at later date.  - Reports she is a vegetarian and monitoring weight. She would like a referral to a specialist for advisement. - No further issues/concerns for discussion today.   ROS per HPI    Health Maintenance:  Health Maintenance Due  Topic Date Due   DTaP/Tdap/Td (1 - Tdap) Never done   PAP SMEAR-Modifier  01/14/2020   COVID-19 Vaccine (3 - 2023-24 season) 02/19/2022     Past Medical History: Patient Active Problem List   Diagnosis Date Noted   Gestational hypertension 05/23/2021   CVA (cerebral vascular accident) ([facility]) 05/20/2021   Bell's palsy    Numbness    Slurred speech    [redacted] weeks gestation of pregnancy    Stroke-like symptoms 05/19/2021   Stroke-like symptom    History of deep vein thrombosis (DVT) of lower extremity    History of pulmonary embolism    DVT (deep venous thrombosis) (H[facility]) 05/07/2021   Pulmonary embolism ([facility]) 05/07/2021   Factor V Leiden ([facility]) 05/07/2021      Social History   reports that she quit smoking about 15 years ago. Her smoking use included cigarettes.  She has never used smokeless tobacco. She reports current alcohol use. She reports current drug use. Drug: Marijuana.   Family History  family history includes Breast cancer in her mother and paternal grandmother; Colon cancer in her maternal uncle; Diabetes in her father; Heart disease in her father; Hypertension in her father, maternal grandmother, and mother; Kidney failure in her father; Ovarian cancer in her paternal grandmother; Pancreatic cancer in her maternal grandmother.   Medications: reviewed and updated   Objective:   Physical Exam BP 121/80 (BP Location: Right Arm, Patient Position: Sitting, Cuff Size: Large)   Pulse 81   Temp 98.6 F (37 C)   Resp 16   Ht 5\' 2"  (1.575 m)   Wt 231 lb 3.2 oz (104.9 kg)   LMP 10/25/2022   SpO2 97%   BMI 42.29 kg/m   Physical Exam HENT:     Head: Normocephalic and atraumatic.  Eyes:     Extraocular Movements: Extraocular movements intact.     Conjunctiva/sclera: Conjunctivae normal.     Pupils: Pupils are equal, round, and reactive to light.  Cardiovascular:     Rate and Rhythm: Normal rate and regular rhythm.     Pulses: Normal pulses.     Heart sounds: Normal heart sounds.  Pulmonary:     Effort: Pulmonary effort is normal.     Breath sounds: Normal breath sounds.  Musculoskeletal:     Cervical back: Normal range of motion and neck supple.  Neurological:  General: No focal deficit present.     Mental Status: She is alert and oriented to person, place, and time.  Psychiatric:        Mood and Affect: Mood normal.        Behavior: Behavior normal.        Assessment & Plan:  1. Encounter to establish care - Patient presents today to establish care. During the interim follow-up with primary provider as scheduled.  - Return for annual physical examination, labs, and health maintenance. Arrive fasting meaning having no food for at least 8 hours prior to appointment. You may have only water or black coffee. Please take  scheduled medications as normal.  2. Screening for metabolic disorder - Routine screening.  - CMP14+EGFR  3. Encounter for weight management 4. Weight gain 5. BMI 40.0-44.9, adult [facility]) - Referral to Medical Weight Management for further evaluation/management. - Amb Ref to Medical Weight Management  6. Encounter to discuss test results - I discussed with patient in detail lab results from Gynecology. Patient verbalized understanding.  7. Prediabetes - Hemoglobin A1c obtained 10/15/2022 from external provider ([facility]) consistent with prediabetes. - Discussed the importance of healthy eating habits, low-carbohydrate diet, low-sugar diet, and regular aerobic exercise (at least 150 minutes a week as tolerated) to achieve or maintain control of prediabetes. - Follow-up in October 2024 or sooner if needed.   Patient was given clear instructions to go to Emergency Department or return to medical center if symptoms don't improve, worsen, or new problems develop.The patient verbalized understanding.  I discussed the assessment and treatment plan with the patient. The patient was provided an opportunity to ask questions and all were answered. The patient agreed with the plan and demonstrated an understanding of the instructions.   The patient was advised to call back or seek an in-person evaluation if the symptoms worsen or if the condition fails to improve as anticipated.    Ricky Stabs, NP 11/17/2022, 2:06 PM Primary Care at [facility]

## 2022-11-11 ENCOUNTER — Telehealth: Payer: Self-pay | Admitting: Physician Assistant

## 2022-11-17 ENCOUNTER — Ambulatory Visit (INDEPENDENT_AMBULATORY_CARE_PROVIDER_SITE_OTHER): Payer: Medicaid Other | Admitting: Family

## 2022-11-17 ENCOUNTER — Encounter: Payer: Self-pay | Admitting: Family

## 2022-11-17 VITALS — BP 121/80 | HR 81 | Temp 98.6°F | Resp 16 | Ht 62.0 in | Wt 231.2 lb

## 2022-11-17 DIAGNOSIS — Z7689 Persons encountering health services in other specified circumstances: Secondary | ICD-10-CM | POA: Diagnosis not present

## 2022-11-17 DIAGNOSIS — Z712 Person consulting for explanation of examination or test findings: Secondary | ICD-10-CM

## 2022-11-17 DIAGNOSIS — Z6841 Body Mass Index (BMI) 40.0 and over, adult: Secondary | ICD-10-CM | POA: Diagnosis not present

## 2022-11-17 DIAGNOSIS — R7303 Prediabetes: Secondary | ICD-10-CM

## 2022-11-17 DIAGNOSIS — R635 Abnormal weight gain: Secondary | ICD-10-CM | POA: Diagnosis not present

## 2022-11-17 DIAGNOSIS — Z13228 Encounter for screening for other metabolic disorders: Secondary | ICD-10-CM

## 2022-11-17 NOTE — Progress Notes (Signed)
Pt is here to est care  Wants to discuss lab results from GYN

## 2022-11-17 NOTE — Patient Instructions (Signed)
Thank you for choosing Primary Care at [facility] for your medical home!    Christine Wong was seen by Rema [name], NP today.   Toni Amend Wurzel's primary care provider is Rema [name], NP.   For the best care possible,  you should try to see Ricky Stabs, NP whenever you come to office.   We look forward to seeing you again soon!  If you have any questions about your visit today,  please call us at [phone]  Or feel free to reach your provider via MyChart.   Keeping you healthy   Get these tests Blood pressure- Have your blood pressure checked once a year by your healthcare provider.  Normal blood pressure is 120/80. Weight- Have your body mass index (BMI) calculated to screen for obesity.  BMI is a measure of body fat based on height and weight. You can also calculate your own BMI at https://www.west-esparza.com/. Cholesterol- Have your cholesterol checked regularly starting at age 10, sooner may be necessary if you have diabetes, high blood pressure, if a family member developed heart diseases at an early age or if you smoke.  Chlamydia, HIV, and other sexual transmitted disease- Get screened each year until the age of 66 then within three months of each new sexual partner. Diabetes- Have your blood sugar checked regularly if you have high blood pressure, high cholesterol, a family history of diabetes or if you are overweight.   Get these vaccines Flu shot- Every fall. Tetanus shot- Every 10 years. Menactra- Single dose; prevents meningitis.   Take these steps Don't smoke- If you do smoke, ask your healthcare provider about quitting. For tips on how to quit, go to www.smokefree.gov or call 1-800-QUIT-NOW. Be physically active- Exercise 5 days a week for at least 30 minutes.  If you are not already physically active start slow and gradually work up to 30 minutes of moderate physical activity.  Examples of moderate activity include walking briskly, mowing the yard, dancing,  swimming bicycling, etc. Eat a healthy diet- Eat a variety of healthy foods such as fruits, vegetables, low fat milk, low fat cheese, yogurt, lean meats, poultry, fish, beans, tofu, etc.  For more information on healthy eating, go to www.thenutritionsource.org Drink alcohol in moderation- Limit alcohol intake two drinks or less a day.  Never drink and drive. Dentist- Brush and floss teeth twice daily; visit your dentis twice a year. Depression-Your emotional health is as important as your physical health.  If you're feeling down, losing interest in things you normally enjoy please talk with your healthcare provider. Gun Safety- If you keep a gun in your home, keep it unloaded and with the safety lock on.  Bullets should be stored separately. Helmet use- Always wear a helmet when riding a motorcycle, bicycle, rollerblading or skateboarding. Safe sex- If you may be exposed to a sexually transmitted infection, use a condom Seat belts- Seat bels can save your life; always wear one. Smoke/Carbon Monoxide detectors- These detectors need to be installed on the appropriate level of your home.  Replace batteries at least once a year. Skin Cancer- When out in the sun, cover up and use sunscreen SPF 15 or higher. Violence- If anyone is threatening or hurting you, please tell your healthcare provider.

## 2022-11-18 ENCOUNTER — Other Ambulatory Visit: Payer: Self-pay | Admitting: Family

## 2022-11-18 DIAGNOSIS — R748 Abnormal levels of other serum enzymes: Secondary | ICD-10-CM

## 2022-11-18 LAB — CMP14+EGFR
ALT: 10 IU/L (ref 0–32)
AST: 15 IU/L (ref 0–40)
Albumin/Globulin Ratio: 1.6 (ref 1.2–2.2)
Albumin: 4.2 g/dL (ref 3.9–4.9)
Alkaline Phosphatase: 199 IU/L — ABNORMAL HIGH (ref 44–121)
BUN/Creatinine Ratio: 10 (ref 9–23)
BUN: 6 mg/dL (ref 6–20)
Bilirubin Total: <0.2 mg/dL (ref 0.0–1.2)
CO2: 21 mmol/L (ref 20–29)
Calcium: 9.4 mg/dL (ref 8.7–10.2)
Chloride: 101 mmol/L (ref 96–106)
Creatinine, Ser: 0.62 mg/dL (ref 0.57–1.00)
Globulin, Total: 2.7 g/dL (ref 1.5–4.5)
Glucose: 80 mg/dL (ref 70–99)
Potassium: 4.5 mmol/L (ref 3.5–5.2)
Sodium: 136 mmol/L (ref 134–144)
Total Protein: 6.9 g/dL (ref 6.0–8.5)
eGFR: 116 mL/min/{1.73_m2} (ref >59)

## 2022-11-19 NOTE — Progress Notes (Deleted)
Patient ID: Christine Wong, female    DOB: Apr 08, 1986  MRN: [id number]  CC: Annual Physical Exam  Subjective: Christine Wong is a 37 y.o. female who presents for annual physical exam.   Her concerns today include:  Will check to see if patient would like labs recollected due to recently obtained during appointment with Gynecology.  Pap due.  Patient Active Problem List   Diagnosis Date Noted   Gestational hypertension 05/23/2021   CVA (cerebral vascular accident) ([facility]) 05/20/2021   Bell's palsy    Numbness    Slurred speech    [redacted] weeks gestation of pregnancy    Stroke-like symptoms 05/19/2021   Stroke-like symptom    History of deep vein thrombosis (DVT) of lower extremity    History of pulmonary embolism    DVT (deep venous thrombosis) (HCC) 05/07/2021   Pulmonary embolism ([facility]CC) 05/07/2021   Factor V Leiden ([facility]CC) 05/07/2021     Current Outpatient Medications on File Prior to Visit  Medication Sig Dispense Refill   ferrous sulfate 325 (65 FE) MG tablet Take 325 mg by mouth daily with breakfast. (Patient not taking: Reported on 11/17/2022)     XARELTO 20 MG TABS tablet TAKE 1 TABLET BY MOUTH DAILY WITH SUPPER. 30 tablet 3   No current facility-administered medications on file prior to visit.    Allergies  Allergen Reactions   Peanut-Containing Drug Products Anaphylaxis   Penicillins Other (See Comments)    Unknown reaction    Social History   Socioeconomic History   Marital status: Single    Spouse name: Not on file   Number of children: Not on file   Years of education: Not on file   Highest education level: Not on file  Occupational History   Not on file  Tobacco Use   Smoking status: Former    Types: Cigarettes    Quit date: 2009    Years since quitting: 15.4   Smokeless tobacco: Never  Vaping Use   Vaping Use: Never used  Substance and Sexual Activity   Alcohol use: Yes    Comment: socially   Drug use: Yes    Types: Marijuana    Comment:  socially last use 9 months ago   Sexual activity: Yes    Partners: Female    Birth control/protection: None  Other Topics Concern   Not on file  Social History Narrative   Not on file   Social Determinants of Health   Financial Resource Strain: Not on file  Food Insecurity: Not on file  Transportation Needs: Not on file  Physical Activity: Not on file  Stress: Not on file  Social Connections: Not on file  Intimate Partner Violence: Not on file    Family History  Problem Relation Age of Onset   Hypertension Mother    Breast cancer Mother    Diabetes Father    Kidney failure Father    Heart disease Father    Hypertension Father    Pancreatic cancer Maternal Grandmother    Hypertension Maternal Grandmother    Breast cancer Paternal Grandmother    Ovarian cancer Paternal Grandmother    Colon cancer Maternal Uncle     Past Surgical History:  Procedure Laterality Date   CESAREAN SECTION N/A 05/22/2021   Procedure: CESAREAN SECTION;  Surgeon: Mitchel Honour, DO;  Location: [facility] LD [facility]S;  Service: Obstetrics;  Laterality: N/A;   WISDOM TOOTH EXTRACTION  2014    ROS: Review of Systems Negative except as stated  above  PHYSICAL EXAM: LMP 10/25/2022   Physical Exam  {female adult master:310786} {female adult master:310785}     Latest Ref Rng & Units 11/17/2022   12:00 AM 04/14/2022    3:06 PM 09/18/2021    9:22 AM  CMP  Glucose 70 - 99 mg/dL 80  93  97   BUN 6 - 20 mg/dL 6  9  12    Creatinine 0.57 - 1.00 mg/dL 1.61  0.96  0.45   Sodium 134 - 144 mmol/L 136  140  138   Potassium 3.5 - 5.2 mmol/L 4.5  4.3  4.4   Chloride 96 - 106 mmol/L 101  107  105   CO2 20 - 29 mmol/L 21  26  28    Calcium 8.7 - 10.2 mg/dL 9.4  9.1  9.3   Total Protein 6.0 - 8.5 g/dL 6.9  7.5  7.6   Total Bilirubin 0.0 - 1.2 mg/dL <4.0  0.3  0.4   Alkaline Phos 44 - 121 IU/L 199  126  126   AST 0 - 40 IU/L 15  16  13    ALT 0 - 32 IU/L 10  13  10     Lipid Panel     Component Value Date/Time    CHOL 316 (H) 05/19/2021 1852   TRIG 219 (H) 05/19/2021 1852   HDL 111 05/19/2021 1852   CHOLHDL 2.8 05/19/2021 1852   VLDL 44 (H) 05/19/2021 1852   LDLCALC 161 (H) 05/19/2021 1852    CBC    Component Value Date/Time   WBC 9.2 04/14/2022 1506   WBC 16.7 (H) 05/23/2021 0428   RBC 4.68 04/14/2022 1506   HGB 13.1 04/14/2022 1506   HCT 39.5 04/14/2022 1506   PLT 445 (H) 04/14/2022 1506   MCV 84.4 04/14/2022 1506   MCH 28.0 04/14/2022 1506   MCHC 33.2 04/14/2022 1506   RDW 13.2 04/14/2022 1506   LYMPHSABS 1.6 04/14/2022 1506   MONOABS 0.8 04/14/2022 1506   EOSABS 0.4 04/14/2022 1506   BASOSABS 0.1 04/14/2022 1506    ASSESSMENT AND PLAN:  There are no diagnoses linked to this encounter.   Patient was given the opportunity to ask questions.  Patient verbalized understanding of the plan and was able to repeat key elements of the plan. Patient was given clear instructions to go to Emergency Department or return to medical center if symptoms don't improve, worsen, or new problems develop.The patient verbalized understanding.   No orders of the defined types were placed in this encounter.    Requested Prescriptions    No prescriptions requested or ordered in this encounter    No follow-ups on file.  Rema [name], NP

## 2022-11-23 ENCOUNTER — Encounter: Payer: Medicaid Other | Admitting: Family

## 2022-11-23 DIAGNOSIS — Z0000 Encounter for general adult medical examination without abnormal findings: Secondary | ICD-10-CM

## 2022-11-30 ENCOUNTER — Inpatient Hospital Stay (HOSPITAL_BASED_OUTPATIENT_CLINIC_OR_DEPARTMENT_OTHER): Payer: Medicaid Other | Admitting: Physician Assistant

## 2022-11-30 ENCOUNTER — Inpatient Hospital Stay: Payer: Medicaid Other | Attending: Physician Assistant

## 2022-11-30 ENCOUNTER — Other Ambulatory Visit: Payer: Self-pay

## 2022-11-30 VITALS — BP 130/87 | HR 80 | Temp 97.3°F | Resp 15 | Wt 231.7 lb

## 2022-11-30 DIAGNOSIS — D75839 Thrombocytosis, unspecified: Secondary | ICD-10-CM | POA: Diagnosis not present

## 2022-11-30 DIAGNOSIS — Z833 Family history of diabetes mellitus: Secondary | ICD-10-CM | POA: Diagnosis not present

## 2022-11-30 DIAGNOSIS — R5383 Other fatigue: Secondary | ICD-10-CM | POA: Diagnosis not present

## 2022-11-30 DIAGNOSIS — D6851 Activated protein C resistance: Secondary | ICD-10-CM | POA: Diagnosis present

## 2022-11-30 DIAGNOSIS — Z800 Family history of malignant neoplasm of digestive organs: Secondary | ICD-10-CM | POA: Insufficient documentation

## 2022-11-30 DIAGNOSIS — Z8041 Family history of malignant neoplasm of ovary: Secondary | ICD-10-CM | POA: Diagnosis not present

## 2022-11-30 DIAGNOSIS — Z880 Allergy status to penicillin: Secondary | ICD-10-CM | POA: Diagnosis not present

## 2022-11-30 DIAGNOSIS — K5900 Constipation, unspecified: Secondary | ICD-10-CM | POA: Insufficient documentation

## 2022-11-30 DIAGNOSIS — E611 Iron deficiency: Secondary | ICD-10-CM | POA: Diagnosis not present

## 2022-11-30 DIAGNOSIS — Z7901 Long term (current) use of anticoagulants: Secondary | ICD-10-CM | POA: Diagnosis not present

## 2022-11-30 DIAGNOSIS — Z87891 Personal history of nicotine dependence: Secondary | ICD-10-CM | POA: Diagnosis not present

## 2022-11-30 DIAGNOSIS — D500 Iron deficiency anemia secondary to blood loss (chronic): Secondary | ICD-10-CM | POA: Diagnosis not present

## 2022-11-30 DIAGNOSIS — Z803 Family history of malignant neoplasm of breast: Secondary | ICD-10-CM | POA: Diagnosis not present

## 2022-11-30 DIAGNOSIS — Z841 Family history of disorders of kidney and ureter: Secondary | ICD-10-CM | POA: Diagnosis not present

## 2022-11-30 DIAGNOSIS — Z8249 Family history of ischemic heart disease and other diseases of the circulatory system: Secondary | ICD-10-CM | POA: Insufficient documentation

## 2022-11-30 DIAGNOSIS — I8290 Acute embolism and thrombosis of unspecified vein: Secondary | ICD-10-CM

## 2022-11-30 DIAGNOSIS — Z79899 Other long term (current) drug therapy: Secondary | ICD-10-CM | POA: Insufficient documentation

## 2022-11-30 LAB — CBC WITH DIFFERENTIAL (CANCER CENTER ONLY)
Abs Immature Granulocytes: 0.05 10*3/uL (ref 0.00–0.07)
Basophils Absolute: 0.1 10*3/uL (ref 0.0–0.1)
Basophils Relative: 1 %
Eosinophils Absolute: 0.2 10*3/uL (ref 0.0–0.5)
Eosinophils Relative: 2 %
HCT: 38.5 % (ref 36.0–46.0)
Hemoglobin: 12.5 g/dL (ref 12.0–15.0)
Immature Granulocytes: 1 %
Lymphocytes Relative: 16 %
Lymphs Abs: 1.6 10*3/uL (ref 0.7–4.0)
MCH: 26.9 pg (ref 26.0–34.0)
MCHC: 32.5 g/dL (ref 30.0–36.0)
MCV: 82.8 fL (ref 80.0–100.0)
Monocytes Absolute: 0.8 10*3/uL (ref 0.1–1.0)
Monocytes Relative: 8 %
Neutro Abs: 7.1 10*3/uL (ref 1.7–7.7)
Neutrophils Relative %: 72 %
Platelet Count: 522 10*3/uL — ABNORMAL HIGH (ref 150–400)
RBC: 4.65 MIL/uL (ref 3.87–5.11)
RDW: 14.1 % (ref 11.5–15.5)
WBC Count: 9.7 10*3/uL (ref 4.0–10.5)
nRBC: 0 % (ref 0.0–0.2)

## 2022-11-30 LAB — IRON AND IRON BINDING CAPACITY (CC-WL,HP ONLY)
Iron: 49 ug/dL (ref 28–170)
Saturation Ratios: 14 % (ref 10.4–31.8)
TIBC: 364 ug/dL (ref 250–450)
UIBC: 315 ug/dL (ref 148–442)

## 2022-11-30 LAB — CMP (CANCER CENTER ONLY)
ALT: 10 U/L (ref 0–44)
AST: 15 U/L (ref 15–41)
Albumin: 4.2 g/dL (ref 3.5–5.0)
Alkaline Phosphatase: 140 U/L — ABNORMAL HIGH (ref 38–126)
Anion gap: 5 (ref 5–15)
BUN: 11 mg/dL (ref 6–20)
CO2: 29 mmol/L (ref 22–32)
Calcium: 9.4 mg/dL (ref 8.9–10.3)
Chloride: 103 mmol/L (ref 98–111)
Creatinine: 0.68 mg/dL (ref 0.44–1.00)
GFR, Estimated: >60 mL/min (ref >60)
Glucose, Bld: 113 mg/dL — ABNORMAL HIGH (ref 70–99)
Potassium: 3.8 mmol/L (ref 3.5–5.1)
Sodium: 137 mmol/L (ref 135–145)
Total Bilirubin: 0.4 mg/dL (ref 0.3–1.2)
Total Protein: 7.7 g/dL (ref 6.5–8.1)

## 2022-11-30 MED ORDER — RIVAROXABAN 10 MG PO TABS: 10.0000 mg | ORAL_TABLET | Freq: Every day | ORAL | 6 refills | Status: DC

## 2022-11-30 NOTE — Progress Notes (Signed)
[facility] [phone]   [phone]) [phone]  PROGRESS NOTE  Patient Care Team: Rema [name], NP as PCP - General (Nurse Practitioner) Maisie Fus, MD as PCP - Cardiology (Cardiology)  Hematological/Oncological History # Heterozygous Factor V Leiden with Hx of VTE/PE # VTE in Pregnancy  08/2019: patient diagnosed with LLE DVT and PE while in [place]. Started on Xarelto therapy. Recommended indefinite anticoagulation due to unprovoked nature of VTE. Diagnosed with heterozygous FVL at that time.  03/11/2021: establish care with Dr. Leonides Wong   Interval History:  Christine Wong 37 y.o. female with medical history significant for heterozygous factor V Leiden with a history of VTE/PE who presents for a follow up visit. Patient's last visit was on 04/14/2022. In the interim, she switched from Lovenox therapy (since she completed breastfeeding) to Xarelto therapy. She is unaccompanied for this visit.   On exam today Christine Wong reports she has lots of fatigue that impacts her ADLs. She reports heavy menstrual bleeding since she completed breastfeeding and resumed Xarelto therapy. She has no other signs of bleeding. She is not taking iron pills as it was causing her constipation. She denies fevers, chills, sweats, shortness of breath, chest pain, cough, headaches or dizziness. She has no other complaints. Full 10 point ROS is listed below.   MEDICAL HISTORY:  Past Medical History:  Diagnosis Date   Anxiety    DVT (deep venous thrombosis) (HCC) 08/2019   HPV (human papilloma virus) infection    Seasonal allergies    Vaginal Pap smear, abnormal     SURGICAL HISTORY: Past Surgical History:  Procedure Laterality Date   CESAREAN SECTION N/A 05/22/2021   Procedure: CESAREAN SECTION;  Surgeon: Mitchel Honour, DO;  Location: [facility];  Service: Obstetrics;  Laterality: N/A;   WISDOM TOOTH EXTRACTION  2014    SOCIAL HISTORY: Social History   Socioeconomic History    Marital status: Single    Spouse name: Not on file   Number of children: Not on file   Years of education: Not on file   Highest education level: Not on file  Occupational History   Not on file  Tobacco Use   Smoking status: Former    Types: Cigarettes    Quit date: 2009    Years since quitting: 15.4   Smokeless tobacco: Never  Vaping Use   Vaping Use: Never used  Substance and Sexual Activity   Alcohol use: Yes    Comment: socially   Drug use: Yes    Types: Marijuana    Comment: socially last use 9 months ago   Sexual activity: Yes    Partners: Female    Birth control/protection: None  Other Topics Concern   Not on file  Social History Narrative   Not on file   Social Determinants of Health   Financial Resource Strain: Not on file  Food Insecurity: Not on file  Transportation Needs: Not on file  Physical Activity: Not on file  Stress: Not on file  Social Connections: Not on file  Intimate Partner Violence: Not on file    FAMILY HISTORY: Family History  Problem Relation Age of Onset   Hypertension Mother    Breast cancer Mother    Diabetes Father    Kidney failure Father    Heart disease Father    Hypertension Father    Pancreatic cancer Maternal Grandmother    Hypertension Maternal Grandmother    Breast cancer Paternal Grandmother    Ovarian cancer Paternal  Grandmother    Colon cancer Maternal Uncle     ALLERGIES:  is allergic to peanut-containing drug products and penicillins.  MEDICATIONS:  Current Outpatient Medications  Medication Sig Dispense Refill   XARELTO 20 MG TABS tablet TAKE 1 TABLET BY MOUTH DAILY WITH SUPPER. 30 tablet 3   ferrous sulfate 325 (65 FE) MG tablet Take 325 mg by mouth daily with breakfast. (Patient not taking: Reported on 11/17/2022)     No current facility-administered medications for this visit.    REVIEW OF SYSTEMS:   Constitutional: ( - ) fevers, ( - )  chills , ( - ) night sweats Eyes: ( - ) blurriness of vision, ( -  ) double vision, ( - ) watery eyes Ears, nose, mouth, throat, and face: ( - ) mucositis, ( - ) sore throat Respiratory: ( - ) cough, ( - ) dyspnea, ( - ) wheezes Cardiovascular: ( - ) palpitation, ( - ) chest discomfort, ( - ) lower extremity swelling Gastrointestinal:  ( - ) nausea, ( - ) heartburn, ( - ) change in bowel habits Skin: ( - ) abnormal skin rashes Lymphatics: ( - ) new lymphadenopathy, ( - ) easy bruising Neurological: ( - ) numbness, ( - ) tingling, ( - ) new weaknesses Behavioral/Psych: ( - ) mood change, ( - ) new changes  All other systems were reviewed with the patient and are negative.  PHYSICAL EXAMINATION: ECOG PERFORMANCE STATUS: 0 - Asymptomatic  Vitals:   11/30/22 1504  BP: 130/87  Pulse: 80  Resp: 15  Temp: (!) 97.3 F (36.3 C)  SpO2: 96%   Filed Weights   11/30/22 1504  Weight: 231 lb 11.2 oz (105.1 kg)    GENERAL: Well appearing pregnant African-American female, alert, no distress and comfortable SKIN: skin color, texture, turgor are normal, no rashes or significant lesions EYES: conjunctiva are pink and non-injected, sclera clear LUNGS: clear to auscultation and percussion with normal breathing effort HEART: regular rate & rhythm and no murmurs and no lower extremity edema Musculoskeletal: no cyanosis of digits and no clubbing  PSYCH: alert & oriented x 3, fluent speech NEURO: no focal motor/sensory deficits  LABORATORY DATA:  I have reviewed the data as listed    Latest Ref Rng & Units 11/30/2022    2:45 PM 04/14/2022    3:06 PM 09/18/2021    9:22 AM  CBC  WBC 4.0 - 10.5 K/uL 9.7  9.2  6.9   Hemoglobin 12.0 - 15.0 g/dL 82.9  56.2  13.0   Hematocrit 36.0 - 46.0 % 38.5  39.5  39.3   Platelets 150 - 400 K/uL 522  445  395        Latest Ref Rng & Units 11/30/2022    2:45 PM 11/17/2022   12:00 AM 04/14/2022    3:06 PM  CMP  Glucose 70 - 99 mg/dL 865  80  93   BUN 6 - 20 mg/dL 11  6  9    Creatinine 0.44 - 1.00 mg/dL 7.84  6.96  2.95    Sodium 135 - 145 mmol/L 137  136  140   Potassium 3.5 - 5.1 mmol/L 3.8  4.5  4.3   Chloride 98 - 111 mmol/L 103  101  107   CO2 22 - 32 mmol/L 29  21  26    Calcium 8.9 - 10.3 mg/dL 9.4  9.4  9.1   Total Protein 6.5 - 8.1 g/dL 7.7  6.9  7.5  Total Bilirubin 0.3 - 1.2 mg/dL 0.4  <7.8  0.3   Alkaline Phos 38 - 126 U/L 140  199  126   AST 15 - 41 U/L 15  15  16    ALT 0 - 44 U/L 10  10  13      RADIOGRAPHIC STUDIES: No results found.    ASSESSMENT & PLAN Christine Wong is a 37 y.o. female with medical history significant for heterozygous factor V Leiden with a history of VTE/PE who presents for a follow up visit.    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 Started on Xarelto therapy. Recommended indefinite anticoagulation due to unprovoked nature of VTE. Diagnosed with heterozygous FVL at that time.  --Completed therapeutic dose lovenox during pregnancy and breastfeeding in 2022-2023. --Switched back to Xarelto 20 mg daily in January 2024. Discussed maintenance dose of 10 mg daily. Patient would like to proceed so new prescription for Xarelto 10 mg daily was sent to pharmacy on file.  --Labs today reviewed with patient. WBC 9.7, Hgb 12.5, Plt 522, Creatinine and LFTs normal.   #Thrombocytosis #Iron deficiency: --Labs today show Plt 522, Iron 49, saturation 14%, ferritin 18. --Secondary to heavy menstrual bleeding. --Unable to tolerate PO iron due to constipation.   --Recommend IV iron to boost levels.   Follow up: 3 months: labs only 6 months: labs and follow up   No orders of the defined types were placed in this encounter.  All questions were answered. The patient knows to call the clinic with any problems, questions or concerns.  I have spent a total of 30 minutes minutes of face-to-face and non-face-to-face time, preparing to see the  patient,performing a medically appropriate examination, counseling and educating the patient, ordering medications/tests/procedures, documenting clinical information in the electronic health record, and care coordination.   Georga Kaufmann PA-C Dept of Hematology and Oncology [facility] Phone: [phone]    11/30/2022 4:37 PM

## 2022-12-01 ENCOUNTER — Telehealth: Payer: Self-pay

## 2022-12-01 ENCOUNTER — Telehealth: Payer: Self-pay | Admitting: Physician Assistant

## 2022-12-01 ENCOUNTER — Encounter: Payer: Self-pay | Admitting: Physician Assistant

## 2022-12-01 DIAGNOSIS — D500 Iron deficiency anemia secondary to blood loss (chronic): Secondary | ICD-10-CM | POA: Insufficient documentation

## 2022-12-01 LAB — FERRITIN: Ferritin: 18 ng/mL (ref 11–307)

## 2022-12-01 NOTE — Telephone Encounter (Signed)
Pt advised with VU 

## 2022-12-01 NOTE — Telephone Encounter (Signed)
Auth Submission: NO AUTH NEEDED Site of care: Site of care: [facility] Payer: Medicaid - [employer] Medication & CPT/J Code(s) submitted: Feraheme (ferumoxytol) [id number] Route of submission (phone, fax, portal):  Phone # Fax # Auth type: Buy/Bill Units/visits requested: 1 Approval from: 12/01/2022 to 04/02/2023

## 2022-12-10 ENCOUNTER — Ambulatory Visit (INDEPENDENT_AMBULATORY_CARE_PROVIDER_SITE_OTHER): Payer: Medicaid Other

## 2022-12-10 ENCOUNTER — Telehealth: Payer: Self-pay | Admitting: Acute Care

## 2022-12-10 VITALS — BP 134/88 | HR 90 | Temp 97.8°F | Resp 22 | Ht 62.0 in | Wt 231.2 lb

## 2022-12-10 DIAGNOSIS — T454X5A Adverse effect of iron and its compounds, initial encounter: Secondary | ICD-10-CM | POA: Diagnosis not present

## 2022-12-10 DIAGNOSIS — D500 Iron deficiency anemia secondary to blood loss (chronic): Secondary | ICD-10-CM

## 2022-12-10 DIAGNOSIS — I8290 Acute embolism and thrombosis of unspecified vein: Secondary | ICD-10-CM | POA: Diagnosis not present

## 2022-12-10 MED ORDER — SODIUM CHLORIDE 0.9 % IV SOLN
510.0000 mg | Freq: Once | INTRAVENOUS | Status: AC
  Administered 2022-12-10: 510 mg via INTRAVENOUS
  Filled 2022-12-10: qty 17

## 2022-12-10 MED ORDER — METHYLPREDNISOLONE SODIUM SUCC 125 MG IJ SOLR
125.0000 mg | Freq: Once | INTRAMUSCULAR | Status: AC | PRN
  Administered 2022-12-10: 125 mg via INTRAVENOUS
  Filled 2022-12-10: qty 2

## 2022-12-10 MED ORDER — DIPHENHYDRAMINE HCL 50 MG/ML IJ SOLN
50.0000 mg | Freq: Once | INTRAMUSCULAR | Status: AC | PRN
  Administered 2022-12-10: 50 mg via INTRAVENOUS
  Filled 2022-12-10: qty 1

## 2022-12-10 MED ORDER — SODIUM CHLORIDE 0.9 % IV SOLN: Freq: Once | INTRAVENOUS | Status: AC | PRN

## 2022-12-10 MED ORDER — EPINEPHRINE 0.3 MG/0.3ML IJ SOAJ
0.3000 mg | Freq: Once | INTRAMUSCULAR | Status: DC | PRN
  Filled 2022-12-10: qty 0.3

## 2022-12-10 MED ORDER — ALBUTEROL SULFATE HFA 108 (90 BASE) MCG/ACT IN AERS
2.0000 | INHALATION_SPRAY | Freq: Once | RESPIRATORY_TRACT | Status: AC | PRN
  Administered 2022-12-10: 2 via RESPIRATORY_TRACT
  Filled 2022-12-10: qty 6.7

## 2022-12-10 MED ORDER — FAMOTIDINE IN NACL 20-0.9 MG/50ML-% IV SOLN
20.0000 mg | Freq: Once | INTRAVENOUS | Status: AC | PRN
  Administered 2022-12-10: 20 mg via INTRAVENOUS
  Filled 2022-12-10: qty 50

## 2022-12-10 NOTE — Progress Notes (Signed)
Diagnosis: Iron Deficiency Anemia  Provider:  Chilton Greathouse MD  Procedure: IV Infusion  IV Type: Peripheral, IV Location: R Hand  Feraheme (Ferumoxytol), Dose: 510 mg  Infusion Start Time: 1505  Infusion Stop Time: 1524  Post Infusion IV Care: Observation period completed and Peripheral IV Discontinued  Patient was 25 minutes into her observation period when she started coughing and stated that she was having a hard time taking a deep breath.  This Clinical research associate informed other staff members of the situation and emergency protocol was started   Kandice Robinsons NP attended @ [phone]  Placed on 2l/min [place] @ 1558 Albuterol  2 puffs given @1558  NS infusing at 999 cc/hr @1600   Rate was adjusted @1617  by Maralyn Sago to 500cc/hr Benadryl 50mg  IV @ 1600 Solumedrol 125mg  @ 1600 Pepcid 25MG  IV @ 1607  Vital signs stable throughout   @ 1628  patient stated that she was feeling better and by 1640 patient stated that she was now able to take a deep breath without difficultly and coughing had resolved     IVF d/ced @ 1655  Patient and her mother were educated on the use of the EPI pen and patient was instructed on the use and direction of the inhaler and demonstrated understanding    Per Kandice Robinsons  NP ok to d/c patient home  Patient driven home by mother   Instructed  to seek emergency care to ED for any worsening symptoms     Discharge: Condition: Stable, Destination: Home . AVS Provided  Performed by:  Marlow Baars [place], RN

## 2022-12-10 NOTE — Telephone Encounter (Signed)
Called by infusion clinic for patient having an adverse reaction to iron infusion. She had completed her 50 mg infusion. She had 5 minutes left for the observation period, and she developed what she describes as hard to take a deep breath. This was 1558. She was given benadryl 50 mg IV, as well as well as Solumedrol 125 IV . She also was given albuterol 2 puffs . Additionally she was given pepcid IV. She has developed a cough and throat clearing. Vitals and sats are stable. BP is 127/86 with sats of 100% on RA. She is tachy at 117. We have added oxygen at 2 L for comfort. Epi Pen is at bedside in case it needs to be used. Pt. States she is ok. Does states she is not  feeling any better, but no worse.  Assessment wise, no wheezing, no stridor, no upper airway swelling. No sensation of swelling of the tongue or upper airway.She has remained alert and oriented. She is anxious. She is on her phone. Cough is less frequent.  The entire reaction protocol has been followed. VS remain stable. We will continue to observe the patient . If she does not start to show signs of improvement in the next 15 minutes, we will transport her to the ED for observation as she should be showing signs of improvement after receiving the medications above. That will be about 45 minutes after initiation of the ADR protocol.    16:28 Pt. States she is feeling better. Cough is resolving. She can take deep breaths again She is feeling better. Sats are 100% on RA, BP is 126/85, 119/83,  HR is 95.RR is 20. She is calling family to pick her up and take her home. She is not safe to drive as she has had Benadryl 50 mg IV. We will send her home with an Epi Pen and the Albuterol that was used during her reaction  Instructions are to use for any difficulty breathing or airway swelling, and call 911 if used, and  have someone get her to the ED. She had instructions on use prior to discharge from the infusion center. Recommend pre-medication prior to  next transfusion .  Bevelyn Ngo, MSN, AGACNP-BC [facility] See Amion for personal pager PCCM on call pager [phone]  12/10/2022 4:37 PM

## 2022-12-13 ENCOUNTER — Telehealth: Payer: Self-pay

## 2022-12-13 ENCOUNTER — Encounter: Payer: Self-pay | Admitting: Family

## 2022-12-13 NOTE — Telephone Encounter (Signed)
T/C from [employer] stating pt had a reaction to her IV iron, Fereheme, on 6/21. She is scheduled for her second treatment on Friday 6/28 and are requesting recommendations to the next step before pt's appt as to whether to cancel or change treatment plan.  Please advise

## 2022-12-13 NOTE — Telephone Encounter (Signed)
Attempted to contact pt but no answer and her mailbox was full.  Spoke with her aunt and asked to relay the message of the new plan.  She will also let her know that the 6/28 appt has been cancelled

## 2022-12-15 ENCOUNTER — Ambulatory Visit: Payer: Medicaid Other | Admitting: Family

## 2022-12-16 NOTE — Progress Notes (Signed)
Patient ID: Christine Wong, female    DOB: 1986-02-02  MRN: [id number]  CC: Iron Infusion Follow-Up  Subjective: Christine Wong is a 37 y.o. female who presents for iron infusion follow-up.   Her concerns today include:  12/13/2022 per triage LPN note: T/C from [facility] [employer] stating pt had a reaction to her IV iron, Fereheme, on 6/21. She is scheduled for her second treatment on Friday 6/28 and are requesting recommendations to the next step before pt's appt as to whether to cancel or change treatment plan.   Please advise  12/13/2022 per MD reply: Please ask them to HOLD the next dose of Feraheme and see if they can get Monoferric approved instead. If monoferric is not approved can try Venofer instead   12/13/2022 per triage LPN note: Attempted to contact pt but no answer and her mailbox was full. Spoke with her aunt and asked to relay the message of the new plan. She will also let her know that the 6/28 appt has been cancelled   Today's visit 12/20/2022: - Reports she had an unpleasant patient experience at the Infusion Center. Reports she is unsure why the Infusion Center had her follow-up with Primary Care after her recent iron infusion reaction at their office. Reports she does not know if her health insurance has approved alternate iron infusion medication. States she plans to call the Infusion Center today to see if they have any updates.  - Insomnia primarily related to thinking throughout the night. She is not ready to begin insomnia medication.  - History of migraines. Endorses light sensitivity. She denies red flag symptoms associated with headaches. She is not ready to begin headache medication. She is taking over-the-counter medication to help with migraines. - Intermittent left foot swelling. She denies recent trauma/injury and red flag symptoms. Reports began during pregnancy and continued. She has a 73 month old. She would like referral to a specialist. - Increased  appetite. Usually eats lunch and dinner. She is trying to monitor what she eats. She is aware to follow-up with primary provider as scheduled.  - No further issues/concerns for discussion today.    Patient Active Problem List   Diagnosis Date Noted   Iron deficiency anemia due to chronic blood loss 12/01/2022   Gestational hypertension 05/23/2021   CVA (cerebral vascular accident) (HCC) 05/20/2021   Bell's palsy    Numbness    Slurred speech    [redacted] weeks gestation of pregnancy    Stroke-like symptoms 05/19/2021   Stroke-like symptom    History of deep vein thrombosis (DVT) of lower extremity    History of pulmonary embolism    DVT (deep venous thrombosis) (HCC) 05/07/2021   Pulmonary embolism (HCC) 05/07/2021   Factor V Leiden (HCC) 05/07/2021     Current Outpatient Medications on File Prior to Visit  Medication Sig Dispense Refill   rivaroxaban (XARELTO) 10 MG TABS tablet Take 1 tablet (10 mg total) by mouth daily. 30 tablet 6   ferrous sulfate 325 (65 FE) MG tablet Take 325 mg by mouth daily with breakfast. (Patient not taking: Reported on 11/17/2022)     No current facility-administered medications on file prior to visit.    Allergies  Allergen Reactions   Peanut-Containing Drug Products Anaphylaxis   Penicillins Other (See Comments)    Unknown reaction    Social History   Socioeconomic History   Marital status: Single    Spouse name: Not on file   Number of children: Not  on file   Years of education: Not on file   Highest education level: Not on file  Occupational History   Not on file  Tobacco Use   Smoking status: Former    Types: Cigarettes    Quit date: 2009    Years since quitting: 15.5   Smokeless tobacco: Never  Vaping Use   Vaping Use: Never used  Substance and Sexual Activity   Alcohol use: Yes    Comment: socially   Drug use: Yes    Types: Marijuana    Comment: socially last use 9 months ago   Sexual activity: Yes    Partners: Female    Birth  control/protection: None  Other Topics Concern   Not on file  Social History Narrative   Not on file   Social Determinants of Health   Financial Resource Strain: Not on file  Food Insecurity: Not on file  Transportation Needs: Not on file  Physical Activity: Not on file  Stress: Not on file  Social Connections: Not on file  Intimate Partner Violence: Not on file    Family History  Problem Relation Age of Onset   Hypertension Mother    Breast cancer Mother    Diabetes Father    Kidney failure Father    Heart disease Father    Hypertension Father    Pancreatic cancer Maternal Grandmother    Hypertension Maternal Grandmother    Breast cancer Paternal Grandmother    Ovarian cancer Paternal Grandmother    Colon cancer Maternal Uncle     Past Surgical History:  Procedure Laterality Date   CESAREAN SECTION N/A 05/22/2021   Procedure: CESAREAN SECTION;  Surgeon: Mitchel Honour, DO;  Location: [facility] LD [facility]S;  Service: Obstetrics;  Laterality: N/A;   WISDOM TOOTH EXTRACTION  2014    ROS: Review of Systems Negative except as stated above  PHYSICAL EXAM: BP 122/87   Pulse 79   Temp 98.2 F (36.8 C) (Oral)   Ht 5' 2.5" (1.588 m)   Wt 229 lb 12.8 oz (104.2 kg)   LMP 12/16/2022 (Exact Date)   SpO2 92%   BMI 41.36 kg/m   Physical Exam HENT:     Head: Normocephalic and atraumatic.     Nose: Nose normal.     Mouth/Throat:     Mouth: Mucous membranes are moist.     Pharynx: Oropharynx is clear.  Eyes:     Extraocular Movements: Extraocular movements intact.     Conjunctiva/sclera: Conjunctivae normal.     Pupils: Pupils are equal, round, and reactive to light.  Cardiovascular:     Rate and Rhythm: Normal rate and regular rhythm.     Pulses: Normal pulses.     Heart sounds: Normal heart sounds.  Pulmonary:     Effort: Pulmonary effort is normal.     Breath sounds: Normal breath sounds.  Musculoskeletal:        General: Normal range of motion.     Right shoulder:  Normal.     Left shoulder: Normal.     Right upper arm: Normal.     Left upper arm: Normal.     Right elbow: Normal.     Left elbow: Normal.     Right forearm: Normal.     Left forearm: Normal.     Right wrist: Normal.     Left wrist: Normal.     Right hand: Normal.     Left hand: Normal.     Cervical back: Normal, normal  range of motion and neck supple.     Thoracic back: Normal.     Lumbar back: Normal.     Right hip: Normal.     Left hip: Normal.     Right upper leg: Normal.     Left upper leg: Normal.     Right knee: Normal.     Left knee: Normal.     Right lower leg: Normal.     Left lower leg: Normal.     Right ankle: Normal.     Left ankle: Normal.     Right foot: Normal.     Left foot: Swelling present.     Comments: 1+ edema left foot.  Neurological:     General: No focal deficit present.     Mental Status: She is alert and oriented to person, place, and time.  Psychiatric:        Mood and Affect: Mood normal.        Behavior: Behavior normal.     ASSESSMENT AND PLAN: 1. Iron deficiency anemia, unspecified iron deficiency anemia type 2. Infusion reaction, subsequent encounter - Keep all scheduled appointments with Infusion Center.   3. Insomnia, unspecified type - Patient declined pharmacological therapy.  - Follow-up with primary provider as scheduled.   4. Migraine without status migrainosus, not intractable, unspecified migraine type - Patient declined pharmacological therapy.  - Follow-up with primary provider as scheduled.   5. Edema of left foot - Referral to Podiatry for further evaluation/management.  - Ambulatory referral to Podiatry    Patient was given the opportunity to ask questions.  Patient verbalized understanding of the plan and was able to repeat key elements of the plan. Patient was given clear instructions to go to Emergency Department or return to medical center if symptoms don't improve, worsen, or new problems develop.The patient  verbalized understanding.   Orders Placed This Encounter  Procedures   Ambulatory referral to Podiatry    Follow-up with primary provider as scheduled.  Rema [name], NP

## 2022-12-17 ENCOUNTER — Ambulatory Visit: Payer: Medicaid Other

## 2022-12-20 ENCOUNTER — Ambulatory Visit (INDEPENDENT_AMBULATORY_CARE_PROVIDER_SITE_OTHER): Payer: Medicaid Other | Admitting: Family

## 2022-12-20 ENCOUNTER — Encounter: Payer: Self-pay | Admitting: Family

## 2022-12-20 VITALS — BP 122/87 | HR 79 | Temp 98.2°F | Ht 62.5 in | Wt 229.8 lb

## 2022-12-20 DIAGNOSIS — G43909 Migraine, unspecified, not intractable, without status migrainosus: Secondary | ICD-10-CM | POA: Diagnosis not present

## 2022-12-20 DIAGNOSIS — D509 Iron deficiency anemia, unspecified: Secondary | ICD-10-CM

## 2022-12-20 DIAGNOSIS — G4700 Insomnia, unspecified: Secondary | ICD-10-CM | POA: Diagnosis not present

## 2022-12-20 DIAGNOSIS — R600 Localized edema: Secondary | ICD-10-CM

## 2022-12-20 DIAGNOSIS — T8090XD Unspecified complication following infusion and therapeutic injection, subsequent encounter: Secondary | ICD-10-CM | POA: Diagnosis not present

## 2022-12-20 NOTE — Patient Instructions (Signed)
Iron Deficiency Anemia, Adult  Iron deficiency anemia is a condition in which the concentration of red blood cells or hemoglobin in the blood is below normal because of too little iron. Hemoglobin is a substance in red blood cells that carries oxygen to the body's tissues. When the concentration of red blood cells or hemoglobin is too low, not enough oxygen reaches these tissues. Iron deficiency anemia is usually long-lasting, and it develops over time. It may or may not cause symptoms. It is a common type of anemia. What are the causes? This condition may be caused by: Not enough iron in the diet. Abnormal absorption in the gut. Blood loss. What increases the risk? You are more likely to develop this condition if you get menstrual periods (menstruate) or are pregnant. What are the signs or symptoms? Symptoms of this condition may include: Pale skin, lips, and nail beds. Weakness, dizziness, and getting tired easily. Shortness of breath when moving or exercising. Cold hands or feet. Mild anemia may not cause any symptoms. How is this diagnosed? This condition is diagnosed based on: Your medical history. A physical exam. Blood tests. How is this treated? This condition is treated by correcting the cause of your iron deficiency. Treatment may involve: Adding iron-rich foods to your diet. Taking iron supplements. If you are pregnant or breastfeeding, you may need to take extra iron because your normal diet usually does not provide the amount of iron that you need. Increasing vitamin C intake. Vitamin C helps your body absorb iron. Your health care provider may recommend that you take iron supplements along with a glass of orange juice or a vitamin C supplement. Medicines to make heavy menstrual flow lighter. Surgery or additional testing procedures to determine the cause of your anemia. You may need repeat blood tests to determine whether treatment is working. If the treatment does not  seem to be working, you may need more tests. Follow these instructions at home: Medicines Take over-the-counter and prescription medicines only as told by your health care provider. This includes iron supplements and vitamins. This is important because too much iron can be harmful. For the best iron absorption, you should take iron supplements when your stomach is empty. If you cannot tolerate them on an empty stomach, you may need to take them with food. Do not drink milk or take antacids at the same time as your iron supplements. Milk and antacids may interfere with how your body absorbs iron. Iron supplements may turn stool (feces) a darker color and it may appear black. If you cannot tolerate taking iron supplements by mouth, talk with your health care provider about taking them through an IV or through an injection into a muscle. Eating and drinking Talk with your health care provider before changing your diet. Your provider may recommend that you eat foods that contain a lot of iron, such as: Liver. Low-fat (lean) beef. Breads and cereals that have iron added to them (are fortified). Eggs. Dried fruit. Dark green, leafy vegetables. To help your body use the iron from iron-rich foods, eat those foods at the same time as fresh fruits and vegetables that are high in vitamin C. Foods that are high in vitamin C include: Oranges. Peppers. Tomatoes. Mangoes. Managing constipation If you are taking an iron supplement, it may cause constipation. To prevent or treat constipation, you may need to: Drink enough fluid to keep your urine pale yellow. Take over-the-counter or prescription medicines. Eat foods that are high in fiber, such   as beans, whole grains, and fresh fruits and vegetables. Limit foods that are high in fat and processed sugars, such as fried or sweet foods. General instructions Return to your normal activities as told by your health care provider. Ask your health care provider  what activities are safe for you. Keep all follow-up visits. Contact a health care provider if: You feel nauseous or you vomit. You feel weak. You become light-headed when getting up from a sitting or lying down position. You have unexplained sweating. You develop symptoms of constipation. You have a heaviness in your chest. You have trouble breathing with physical activity. Get help right away if: You faint. If this happens, do not drive yourself to the hospital. You have an irregular or rapid heartbeat. Summary Iron deficiency anemia is a condition in which the concentration of red blood cells or hemoglobin in the blood is below normal because of too little iron. This condition is treated by correcting the cause of your iron deficiency. Take over-the-counter and prescription medicines only as told by your health care provider. This includes iron supplements and vitamins. To help your body use the iron from iron-rich foods, eat those foods at the same time as fresh fruits and vegetables that are high in vitamin C. Seek medical help if you have signs or symptoms of worsening anemia. This information is not intended to replace advice given to you by your health care provider. Make sure you discuss any questions you have with your health care provider. Document Revised: 07/15/2021 Document Reviewed: 07/15/2021 Elsevier Patient Education  2024 [employer]vier Inc.  

## 2022-12-20 NOTE — Progress Notes (Signed)
Pt oncologyst wanted her to get two fusions, the first fusion she had a reaction to, they never gave her benydril for her first fusion.   Pt has been having headaches and wants to talk about symptoms of low iron.   Pt has insomnia severely she stated.

## 2022-12-30 ENCOUNTER — Encounter: Payer: Self-pay | Admitting: Family

## 2023-01-03 ENCOUNTER — Inpatient Hospital Stay (HOSPITAL_BASED_OUTPATIENT_CLINIC_OR_DEPARTMENT_OTHER): Payer: Medicaid Other

## 2023-01-03 ENCOUNTER — Telehealth: Payer: Self-pay | Admitting: Hematology and Oncology

## 2023-01-03 ENCOUNTER — Inpatient Hospital Stay: Payer: Medicaid Other | Attending: Physician Assistant | Admitting: Hematology and Oncology

## 2023-01-03 ENCOUNTER — Other Ambulatory Visit: Payer: Self-pay | Admitting: Family

## 2023-01-03 ENCOUNTER — Other Ambulatory Visit: Payer: Self-pay

## 2023-01-03 VITALS — BP 133/86 | HR 86 | Temp 97.9°F | Resp 16 | Wt 231.8 lb

## 2023-01-03 DIAGNOSIS — Z803 Family history of malignant neoplasm of breast: Secondary | ICD-10-CM | POA: Diagnosis not present

## 2023-01-03 DIAGNOSIS — Z8041 Family history of malignant neoplasm of ovary: Secondary | ICD-10-CM | POA: Diagnosis not present

## 2023-01-03 DIAGNOSIS — Z800 Family history of malignant neoplasm of digestive organs: Secondary | ICD-10-CM | POA: Diagnosis not present

## 2023-01-03 DIAGNOSIS — Z86718 Personal history of other venous thrombosis and embolism: Secondary | ICD-10-CM | POA: Insufficient documentation

## 2023-01-03 DIAGNOSIS — R059 Cough, unspecified: Secondary | ICD-10-CM | POA: Insufficient documentation

## 2023-01-03 DIAGNOSIS — Z8249 Family history of ischemic heart disease and other diseases of the circulatory system: Secondary | ICD-10-CM | POA: Diagnosis not present

## 2023-01-03 DIAGNOSIS — D500 Iron deficiency anemia secondary to blood loss (chronic): Secondary | ICD-10-CM

## 2023-01-03 DIAGNOSIS — Z79899 Other long term (current) drug therapy: Secondary | ICD-10-CM | POA: Diagnosis not present

## 2023-01-03 DIAGNOSIS — D6851 Activated protein C resistance: Secondary | ICD-10-CM | POA: Diagnosis not present

## 2023-01-03 DIAGNOSIS — Z87891 Personal history of nicotine dependence: Secondary | ICD-10-CM | POA: Diagnosis not present

## 2023-01-03 DIAGNOSIS — Z841 Family history of disorders of kidney and ureter: Secondary | ICD-10-CM | POA: Diagnosis not present

## 2023-01-03 DIAGNOSIS — Z833 Family history of diabetes mellitus: Secondary | ICD-10-CM | POA: Diagnosis not present

## 2023-01-03 DIAGNOSIS — I8290 Acute embolism and thrombosis of unspecified vein: Secondary | ICD-10-CM

## 2023-01-03 DIAGNOSIS — D75839 Thrombocytosis, unspecified: Secondary | ICD-10-CM | POA: Diagnosis not present

## 2023-01-03 DIAGNOSIS — F439 Reaction to severe stress, unspecified: Secondary | ICD-10-CM

## 2023-01-03 DIAGNOSIS — Z7901 Long term (current) use of anticoagulants: Secondary | ICD-10-CM | POA: Insufficient documentation

## 2023-01-03 DIAGNOSIS — Z86711 Personal history of pulmonary embolism: Secondary | ICD-10-CM | POA: Diagnosis not present

## 2023-01-03 DIAGNOSIS — Z880 Allergy status to penicillin: Secondary | ICD-10-CM | POA: Insufficient documentation

## 2023-01-03 LAB — CMP (CANCER CENTER ONLY)
ALT: 12 U/L (ref 0–44)
AST: 14 U/L — ABNORMAL LOW (ref 15–41)
Albumin: 4.2 g/dL (ref 3.5–5.0)
Alkaline Phosphatase: 131 U/L — ABNORMAL HIGH (ref 38–126)
Anion gap: 7 (ref 5–15)
BUN: 11 mg/dL (ref 6–20)
CO2: 26 mmol/L (ref 22–32)
Calcium: 9.6 mg/dL (ref 8.9–10.3)
Chloride: 105 mmol/L (ref 98–111)
Creatinine: 0.7 mg/dL (ref 0.44–1.00)
GFR, Estimated: >60 mL/min (ref >60)
Glucose, Bld: 104 mg/dL — ABNORMAL HIGH (ref 70–99)
Potassium: 3.9 mmol/L (ref 3.5–5.1)
Sodium: 138 mmol/L (ref 135–145)
Total Bilirubin: 0.4 mg/dL (ref 0.3–1.2)
Total Protein: 7.4 g/dL (ref 6.5–8.1)

## 2023-01-03 LAB — CBC WITH DIFFERENTIAL (CANCER CENTER ONLY)
Abs Immature Granulocytes: 0.04 10*3/uL (ref 0.00–0.07)
Basophils Absolute: 0.1 10*3/uL (ref 0.0–0.1)
Basophils Relative: 1 %
Eosinophils Absolute: 0.3 10*3/uL (ref 0.0–0.5)
Eosinophils Relative: 4 %
HCT: 39.1 % (ref 36.0–46.0)
Hemoglobin: 13.1 g/dL (ref 12.0–15.0)
Immature Granulocytes: 1 %
Lymphocytes Relative: 19 %
Lymphs Abs: 1.6 10*3/uL (ref 0.7–4.0)
MCH: 28 pg (ref 26.0–34.0)
MCHC: 33.5 g/dL (ref 30.0–36.0)
MCV: 83.5 fL (ref 80.0–100.0)
Monocytes Absolute: 0.7 10*3/uL (ref 0.1–1.0)
Monocytes Relative: 8 %
Neutro Abs: 5.7 10*3/uL (ref 1.7–7.7)
Neutrophils Relative %: 67 %
Platelet Count: 468 10*3/uL — ABNORMAL HIGH (ref 150–400)
RBC: 4.68 MIL/uL (ref 3.87–5.11)
RDW: 14.7 % (ref 11.5–15.5)
WBC Count: 8.3 10*3/uL (ref 4.0–10.5)
nRBC: 0 % (ref 0.0–0.2)

## 2023-01-03 LAB — IRON AND IRON BINDING CAPACITY (CC-WL,HP ONLY)
Iron: 96 ug/dL (ref 28–170)
Saturation Ratios: 32 % — ABNORMAL HIGH (ref 10.4–31.8)
TIBC: 305 ug/dL (ref 250–450)
UIBC: 209 ug/dL (ref 148–442)

## 2023-01-03 LAB — FERRITIN: Ferritin: 136 ng/mL (ref 11–307)

## 2023-01-03 NOTE — Telephone Encounter (Signed)
Referral to Psychiatry. Expect call within 2 weeks with appointment details.

## 2023-01-03 NOTE — Progress Notes (Signed)
[facility] [phone]   [phone]  PROGRESS NOTE  Patient Care Team: Rema [name], NP as PCP - General (Nurse Practitioner) Maisie Fus, MD as PCP - Cardiology (Cardiology)  Hematological/Oncological History # Heterozygous Factor V Leiden with Hx of VTE/PE # VTE in Pregnancy  08/2019: patient diagnosed with LLE DVT and PE while in [place]. Started on Xarelto therapy. Recommended indefinite anticoagulation due to unprovoked nature of VTE. Diagnosed with heterozygous FVL at that time.  03/11/2021: establish care with Dr. Leonides Schanz  12/10/2022: IV feraheme 510 mg x 1 dose  Interval History:  Christine Wong 37 y.o. female with medical history significant for heterozygous factor V Leiden with a history of VTE/PE who presents for a follow up visit. Patient's last visit was on 11/30/2022. In the interim, she has continued on lovenox therapy.   On exam today Ms. Lennartz reports her first IV iron infusion did not go well.  She developed a cough and allergic symptoms.  She felt like she "could not breathe".  She received albuterol which "not cooperate with my body".  She notes she also received Benadryl which made her "feel funny".  She notes that she did go home afterwards and slept it off.  Unfortunately she had about 2 weeks of insomnia after that time.  She notes that she has not had any issues with bleeding, bruising, or dark stools in the interim since her last visit.  She does that she is continued on her Xarelto therapy without any difficulty.  She notes the cost of the medication is okay.  She does feel like her energy levels have improved since she received the infusion.  Denies any lightheadedness, dizziness, or shortness of breath.  She denies fevers, chills, sweats, shortness of breath, chest pain, cough, headaches or dizziness. She has no other complaints. Full 10 point ROS is listed below.   MEDICAL HISTORY:  Past Medical History:  Diagnosis Date    Anxiety    DVT (deep venous thrombosis) (HCC) 08/2019   HPV (human papilloma virus) infection    Seasonal allergies    Vaginal Pap smear, abnormal     SURGICAL HISTORY: Past Surgical History:  Procedure Laterality Date   CESAREAN SECTION N/A 05/22/2021   Procedure: CESAREAN SECTION;  Surgeon: Mitchel Honour, DO;  Location: [facility]S;  Service: Obstetrics;  Laterality: N/A;   WISDOM TOOTH EXTRACTION  2014    SOCIAL HISTORY: Social History   Socioeconomic History   Marital status: Single    Spouse name: Not on file   Number of children: Not on file   Years of education: Not on file   Highest education level: Not on file  Occupational History   Not on file  Tobacco Use   Smoking status: Former    Current packs/day: 0.00    Types: Cigarettes    Quit date: 2009    Years since quitting: 15.5   Smokeless tobacco: Never  Vaping Use   Vaping status: Never Used  Substance and Sexual Activity   Alcohol use: Yes    Comment: socially   Drug use: Yes    Types: Marijuana    Comment: socially last use 9 months ago   Sexual activity: Yes    Partners: Female    Birth control/protection: None  Other Topics Concern   Not on file  Social History Narrative   Not on file   Social Determinants of Health   Financial Resource Strain: Not on file  Food  Insecurity: Not on file  Transportation Needs: Not on file  Physical Activity: Not on file  Stress: Low Risk  (01/26/2020)   Received from [facility]   Stress    Stress Risk: Not on file    General Anxiety Disorder (GAD-7) Score: Not on file  Social Connections: Not on file  Intimate Partner Violence: Not on file    FAMILY HISTORY: Family History  Problem Relation Age of Onset   Hypertension Mother    Breast cancer Mother    Diabetes Father    Kidney failure Father    Heart disease Father    Hypertension Father    Pancreatic cancer Maternal Grandmother    Hypertension Maternal Grandmother    Breast cancer Paternal  Grandmother    Ovarian cancer Paternal Grandmother    Colon cancer Maternal Uncle     ALLERGIES:  is allergic to peanut-containing drug products and penicillins.  MEDICATIONS:  Current Outpatient Medications  Medication Sig Dispense Refill   ferrous sulfate 325 (65 FE) MG tablet Take 325 mg by mouth daily with breakfast. (Patient not taking: Reported on 11/17/2022)     No current facility-administered medications for this visit.    REVIEW OF SYSTEMS:   Constitutional: ( - ) fevers, ( - )  chills , ( - ) night sweats Eyes: ( - ) blurriness of vision, ( - ) double vision, ( - ) watery eyes Ears, nose, mouth, throat, and face: ( - ) mucositis, ( - ) sore throat Respiratory: ( - ) cough, ( - ) dyspnea, ( - ) wheezes Cardiovascular: ( - ) palpitation, ( - ) chest discomfort, ( - ) lower extremity swelling Gastrointestinal:  ( - ) nausea, ( - ) heartburn, ( - ) change in bowel habits Skin: ( - ) abnormal skin rashes Lymphatics: ( - ) new lymphadenopathy, ( - ) easy bruising Neurological: ( - ) numbness, ( - ) tingling, ( - ) new weaknesses Behavioral/Psych: ( - ) mood change, ( - ) new changes  All other systems were reviewed with the patient and are negative.  PHYSICAL EXAMINATION: ECOG PERFORMANCE STATUS: 0 - Asymptomatic  Vitals:   01/03/23 1158  BP: 133/86  Pulse: 86  Resp: 16  Temp: 97.9 F (36.6 C)  SpO2: 98%    Filed Weights   01/03/23 1158  Weight: 231 lb 12.8 oz (105.1 kg)     GENERAL: Well appearing pregnant African-American female, alert, no distress and comfortable SKIN: skin color, texture, turgor are normal, no rashes or significant lesions EYES: conjunctiva are pink and non-injected, sclera clear LUNGS: clear to auscultation and percussion with normal breathing effort HEART: regular rate & rhythm and no murmurs and no lower extremity edema Musculoskeletal: no cyanosis of digits and no clubbing  PSYCH: alert & oriented x 3, fluent speech NEURO: no focal  motor/sensory deficits  LABORATORY DATA:  I have reviewed the data as listed    Latest Ref Rng & Units 01/03/2023   11:36 AM 11/30/2022    2:45 PM 04/14/2022    3:06 PM  CBC  WBC 4.0 - 10.5 K/uL 8.3  9.7  9.2   Hemoglobin 12.0 - 15.0 g/dL 91.4  78.2  95.6   Hematocrit 36.0 - 46.0 % 39.1  38.5  39.5   Platelets 150 - 400 K/uL 468  522  445        Latest Ref Rng & Units 01/03/2023   11:36 AM 11/30/2022    2:45 PM 11/17/2022  12:00 AM  CMP  Glucose 70 - 99 mg/dL 161  096  80   BUN 6 - 20 mg/dL 11  11  6    Creatinine 0.44 - 1.00 mg/dL 0.45  4.09  8.11   Sodium 135 - 145 mmol/L 138  137  136   Potassium 3.5 - 5.1 mmol/L 3.9  3.8  4.5   Chloride 98 - 111 mmol/L 105  103  101   CO2 22 - 32 mmol/L 26  29  21    Calcium 8.9 - 10.3 mg/dL 9.6  9.4  9.4   Total Protein 6.5 - 8.1 g/dL 7.4  7.7  6.9   Total Bilirubin 0.3 - 1.2 mg/dL 0.4  0.4  <9.1   Alkaline Phos 38 - 126 U/L 131  140  199   AST 15 - 41 U/L 14  15  15    ALT 0 - 44 U/L 12  10  10      RADIOGRAPHIC STUDIES: No results found.    ASSESSMENT & PLAN Tonianne Fine is a 37 y.o. female with medical history significant for heterozygous factor V Leiden with a history of VTE/PE who presents for a follow up visit.    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 Started on Xarelto therapy. Recommended indefinite anticoagulation due to unprovoked nature of VTE. Diagnosed with heterozygous FVL at that time.  --Completed therapeutic dose lovenox during pregnancy and breastfeeding in 2022-2023. --continue Xarelto 10 mg PO daily --Labs today show WBC 8.3, Plt 468, Hgb 13.1, MCV 83.5  #Thrombocytosis #Iron deficiency: --Labs today show Plt 468, Hgb 13.1, MCV 83.5 --Secondary to heavy menstrual bleeding. --Unable to tolerate PO iron due to constipation.   --patient received IV feraheme 510 mg x 1 dose on  12/10/2022.  She had a severe infusion reaction with this. -- Patient notes that she is up to receiving another brand of IV iron therapy. -- If ferritin is persistently low will pursue an additional round of an alternative IV iron.  Follow up: 6 months: labs and follow up (will see sooner if more IV iron is required)   No orders of the defined types were placed in this encounter.  All questions were answered. The patient knows to call the clinic with any problems, questions or concerns.  I have spent a total of 30 minutes minutes of face-to-face and non-face-to-face time, preparing to see the patient,performing a medically appropriate examination, counseling and educating the patient, ordering medications/tests/procedures, documenting clinical information in the electronic health record, and care coordination.   Ulysees Barns, MD Department of Hematology/Oncology [facility] Phone: [phone] Pager: [phone] Email: [email or web address]  01/03/2023 2:43 PM

## 2023-01-18 ENCOUNTER — Encounter: Payer: Medicaid Other | Admitting: Family

## 2023-01-18 NOTE — Progress Notes (Signed)
Erroneous encounter-disregard

## 2023-01-25 ENCOUNTER — Encounter: Payer: Self-pay | Admitting: Family

## 2023-01-25 ENCOUNTER — Ambulatory Visit (INDEPENDENT_AMBULATORY_CARE_PROVIDER_SITE_OTHER): Payer: Medicaid Other | Admitting: Family

## 2023-01-25 VITALS — BP 114/80 | HR 76 | Temp 97.9°F | Ht 62.5 in | Wt 230.2 lb

## 2023-01-25 DIAGNOSIS — Z0000 Encounter for general adult medical examination without abnormal findings: Secondary | ICD-10-CM | POA: Diagnosis not present

## 2023-01-25 DIAGNOSIS — G43909 Migraine, unspecified, not intractable, without status migrainosus: Secondary | ICD-10-CM | POA: Diagnosis not present

## 2023-01-25 DIAGNOSIS — Z13228 Encounter for screening for other metabolic disorders: Secondary | ICD-10-CM

## 2023-01-25 DIAGNOSIS — Z13220 Encounter for screening for lipoid disorders: Secondary | ICD-10-CM

## 2023-01-25 DIAGNOSIS — Z1329 Encounter for screening for other suspected endocrine disorder: Secondary | ICD-10-CM

## 2023-01-25 DIAGNOSIS — Z131 Encounter for screening for diabetes mellitus: Secondary | ICD-10-CM

## 2023-01-25 NOTE — Patient Instructions (Signed)

## 2023-01-25 NOTE — Progress Notes (Signed)
Pt has patch between breast and wants it looked at.

## 2023-01-25 NOTE — Progress Notes (Signed)
Patient ID: Christine Wong, female    DOB: [birth year]  MRN: [id number]  CC: Annual Physical Exam  Subjective: Christine Wong is a 37 y.o. female who presents for annual physical exam.  Her concerns today include:  - Established with Gynecology for women's health maintenance including pap smear.  - Intermittent headaches persisting. She denies associated red flag symptoms. Would like referral to specialist.  - Using over-the-counter medications/soap for irritation between toes of left foot. - No further issues/concerns for discussion today.   Patient Active Problem List   Diagnosis Date Noted   Iron deficiency anemia due to chronic blood loss 12/01/2022   Gestational hypertension 05/23/2021   CVA (cerebral vascular accident) (HCC) 05/20/2021   Bell's palsy    Numbness    Slurred speech    [redacted] weeks gestation of pregnancy    Stroke-like symptoms 05/19/2021   Stroke-like symptom    History of deep vein thrombosis (DVT) of lower extremity    History of pulmonary embolism    DVT (deep venous thrombosis) (HCC) 05/07/2021   Pulmonary embolism (HCC) 05/07/2021   Factor V Leiden (HCC) 05/07/2021     Current Outpatient Medications on File Prior to Visit  Medication Sig Dispense Refill   rivaroxaban (XARELTO) 10 MG TABS tablet Take 10 mg by mouth daily.     ferrous sulfate 325 (65 FE) MG tablet Take 325 mg by mouth daily with breakfast. (Patient not taking: Reported on 11/17/2022)     No current facility-administered medications on file prior to visit.    Allergies  Allergen Reactions   Peanut-Containing Drug Products Anaphylaxis   Penicillins Other (See Comments)    Unknown reaction    Social History   Socioeconomic History   Marital status: Single    Spouse name: Not on file   Number of children: Not on file   Years of education: Not on file   Highest education level: Not on file  Occupational History   Not on file  Tobacco Use   Smoking status: Former    Current  packs/day: 0.00    Types: Cigarettes    Quit date: 2009    Years since quitting: 15.6   Smokeless tobacco: Never  Vaping Use   Vaping status: Never Used  Substance and Sexual Activity   Alcohol use: Yes    Comment: socially   Drug use: Yes    Types: Marijuana    Comment: socially last use 9 months ago   Sexual activity: Yes    Partners: Female    Birth control/protection: None  Other Topics Concern   Not on file  Social History Narrative   Not on file   Social Determinants of Health   Financial Resource Strain: Not on file  Food Insecurity: Not on file  Transportation Needs: Not on file  Physical Activity: Not on file  Stress: Low Risk  (01/26/2020)   Received from [facility] [employer]   Stress    Stress Risk: Not on file    General Anxiety Disorder (GAD-7) Score: Not on file  Social Connections: Not on file  Intimate Partner Violence: Not on file    Family History  Problem Relation Age of Onset   Hypertension Mother    Breast cancer Mother    Diabetes Father    Kidney failure Father    Heart disease Father    Hypertension Father    Pancreatic cancer Maternal Grandmother    Hypertension Maternal Grandmother    Breast cancer Paternal  Grandmother    Ovarian cancer Paternal Grandmother    Colon cancer Maternal Uncle     Past Surgical History:  Procedure Laterality Date   CESAREAN SECTION N/A 05/22/2021   Procedure: CESAREAN SECTION;  Surgeon: Mitchel Honour, DO;  Location: [facility] LD ORS;  Service: Obstetrics;  Laterality: N/A;   WISDOM TOOTH EXTRACTION  2014    ROS: Review of Systems Negative except as stated above  PHYSICAL EXAM: BP 114/80   Pulse 76   Temp 97.9 F (36.6 C) (Oral)   Ht 5' 2.5" (1.588 m)   Wt 230 lb 3.2 oz (104.4 kg)   LMP 01/13/2023 (Exact Date)   SpO2 95%   BMI 41.43 kg/m   Physical Exam HENT:     Head: Normocephalic and atraumatic.     Right Ear: Tympanic membrane, ear canal and external ear normal.     Left Ear:  Tympanic membrane, ear canal and external ear normal.     Nose: Nose normal.     Mouth/Throat:     Mouth: Mucous membranes are moist.     Pharynx: Oropharynx is clear.  Eyes:     Extraocular Movements: Extraocular movements intact.     Conjunctiva/sclera: Conjunctivae normal.     Pupils: Pupils are equal, round, and reactive to light.  Cardiovascular:     Rate and Rhythm: Normal rate and regular rhythm.     Pulses: Normal pulses.     Heart sounds: Normal heart sounds.  Pulmonary:     Effort: Pulmonary effort is normal.     Breath sounds: Normal breath sounds.  Chest:     Comments: Patient declined.  Abdominal:     General: Bowel sounds are normal.     Palpations: Abdomen is soft.  Genitourinary:    Comments: Patient declined.  Musculoskeletal:        General: Normal range of motion.     Right shoulder: Normal.     Left shoulder: Normal.     Right upper arm: Normal.     Left upper arm: Normal.     Right elbow: Normal.     Left elbow: Normal.     Right forearm: Normal.     Left forearm: Normal.     Right wrist: Normal.     Left wrist: Normal.     Right hand: Normal.     Left hand: Normal.     Cervical back: Normal, normal range of motion and neck supple.     Thoracic back: Normal.     Lumbar back: Normal.     Right hip: Normal.     Left hip: Normal.     Right upper leg: Normal.     Left upper leg: Normal.     Right knee: Normal.     Left knee: Normal.     Right lower leg: Normal.     Left lower leg: Normal.     Right ankle: Normal.     Left ankle: Normal.     Right foot: Normal.     Left foot: Normal.  Skin:    General: Skin is warm and dry.     Capillary Refill: Capillary refill takes less than 2 seconds.  Neurological:     General: No focal deficit present.     Mental Status: She is alert and oriented to person, place, and time.  Psychiatric:        Mood and Affect: Mood normal.        Behavior: Behavior normal.  ASSESSMENT AND PLAN: 1. Annual  physical exam - Counseled on 150 minutes of exercise per week as tolerated, healthy eating (including decreased daily intake of saturated fats, cholesterol, added sugars, sodium), STI prevention, and routine healthcare maintenance.  2. Screening for metabolic disorder - Routine screening.  - CMP14+EGFR  3. Diabetes mellitus screening - Routine screening.  - Hemoglobin A1c  4. Screening cholesterol level - Routine screening.  - Lipid panel  5. Thyroid disorder screen - Routine screening.  - TSH  6. Migraine without status migrainosus, not intractable, unspecified migraine type - Referral to Neurology for further evaluation/management. - Ambulatory referral to Neurology   Patient was given the opportunity to ask questions.  Patient verbalized understanding of the plan and was able to repeat key elements of the plan. Patient was given clear instructions to go to Emergency Department or return to medical center if symptoms don't improve, worsen, or new problems develop.The patient verbalized understanding.   Orders Placed This Encounter  Procedures   Lipid panel   TSH   CMP14+EGFR   Hemoglobin A1c   Ambulatory referral to Neurology    Return in about 1 year (around 01/25/2024) for Physical per patient preference.  Rema [name], NP

## 2023-01-26 ENCOUNTER — Encounter: Payer: Self-pay | Admitting: Family

## 2023-01-26 ENCOUNTER — Ambulatory Visit: Payer: Medicaid Other | Admitting: Podiatry

## 2023-01-26 ENCOUNTER — Telehealth: Payer: Self-pay

## 2023-01-26 DIAGNOSIS — R7303 Prediabetes: Secondary | ICD-10-CM | POA: Insufficient documentation

## 2023-01-26 NOTE — Telephone Encounter (Signed)
Pt given lab results per notes of A. Zonia Kief NP on 01/26/23. Pt verbalized understanding.

## 2023-01-26 NOTE — Telephone Encounter (Signed)
Patient scheduled for 3months recheck for labs

## 2023-01-31 NOTE — Telephone Encounter (Signed)
Pt is calling back to schedule labs in 3 months. Can order be placed. Lab results given by Gena Fray. Please advise CB [phone]

## 2023-02-07 ENCOUNTER — Ambulatory Visit: Payer: Medicaid Other | Admitting: Podiatry

## 2023-02-10 ENCOUNTER — Encounter (HOSPITAL_COMMUNITY): Payer: Self-pay

## 2023-02-15 ENCOUNTER — Encounter: Payer: Self-pay | Admitting: Nurse Practitioner

## 2023-02-15 ENCOUNTER — Ambulatory Visit (INDEPENDENT_AMBULATORY_CARE_PROVIDER_SITE_OTHER): Payer: Medicaid Other | Admitting: Nurse Practitioner

## 2023-02-15 ENCOUNTER — Other Ambulatory Visit (INDEPENDENT_AMBULATORY_CARE_PROVIDER_SITE_OTHER): Payer: Medicaid Other

## 2023-02-15 VITALS — BP 120/80 | HR 89 | Ht 62.0 in | Wt 238.0 lb

## 2023-02-15 DIAGNOSIS — K219 Gastro-esophageal reflux disease without esophagitis: Secondary | ICD-10-CM

## 2023-02-15 DIAGNOSIS — R748 Abnormal levels of other serum enzymes: Secondary | ICD-10-CM | POA: Diagnosis not present

## 2023-02-15 LAB — GAMMA GT: GGT: 25 U/L (ref 7–51)

## 2023-02-15 LAB — ALKALINE PHOSPHATASE: Alkaline Phosphatase: 134 U/L — ABNORMAL HIGH (ref 39–117)

## 2023-02-15 NOTE — Progress Notes (Signed)
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 S/P C section 08/2020.  She presents to our office today as referred by Ricky Stabs NP for further evaluation regarding elevated Alk phos level.  No known history of liver disease. Brother with history of liver disease, further details are unknown. She denies having any nausea or vomiting. No chest pain or upper abdominal pain. Epic records show an elevated Alk phos level of 170 - 239 in 2022, normal alk phos in 2023 and alk phos level 199 - 148 in 2024. She endorses having intermittent acid reflux, she describes feeling acid in her throat which typically occurs at nighttime.  No dysphagia. She does not take any acid reducing medications. She is passing normal brown bowel movements was days.  No rectal bleeding or black stools.  She is followed by hematologist Dr. Jeanie Sewer secondary to a history of iron deficiency (secondary to menorrhagia), thrombocytosis and factor V Leiden deficiency.  She received IV Feraheme 12/10/2022 and she developed an allergic reaction with a cough and felt she could not breathe.  An alternative IV iron considered if needed in the future.  She takes ferrous sulfate 325 mg daily.     Latest Ref Rng & Units 01/03/2023   11:36 AM 11/30/2022    2:45 PM 04/14/2022    3:06 PM  CBC  WBC 4.0 - 10.5 K/uL 8.3  9.7  9.2   Hemoglobin 12.0 - 15.0 g/dL 40.9  81.1  91.4   Hematocrit 36.0 - 46.0 % 39.1  38.5  39.5   Platelets 150 - 400 K/uL 468  522  445         Latest Ref Rng & Units 01/25/2023   11:08 AM 01/03/2023   11:36 AM 11/30/2022    2:45 PM  CMP  Glucose 70 - 99 mg/dL 71  782  956   BUN 6 - 20 mg/dL 12  11  11    Creatinine 0.57 -  1.00 mg/dL 2.13  0.86  5.78   Sodium 134 - 144 mmol/L 138  138  137   Potassium 3.5 - 5.2 mmol/L 4.7  3.9  3.8   Chloride 96 - 106 mmol/L 101  105  103   CO2 20 - 29 mmol/L 22  26  29    Calcium 8.7 - 10.2 mg/dL 9.5  9.6  9.4   Total Protein 6.0 - 8.5 g/dL 7.5  7.4  7.7   Total Bilirubin 0.0 - 1.2 mg/dL 0.4  0.4  0.4   Alkaline Phos 44 - 121 IU/L 148  131  140   AST 0 - 40 IU/L 19  14  15    ALT 0 - 32 IU/L 11  12  10       Past Medical History:  Diagnosis Date   Anxiety    DVT (deep venous thrombosis) (HCC) 08/2019   HPV (human papilloma virus) infection    Seasonal allergies    Vaginal Pap smear, abnormal    Past Surgical History:  Procedure Laterality Date   CESAREAN SECTION N/A 05/22/2021   Procedure: CESAREAN SECTION;  Surgeon: Mitchel Honour, DO;  Location: [facility];  Service: Obstetrics;  Laterality: N/A;   WISDOM TOOTH EXTRACTION  2014   Social History: She is single.  She has 1 daughter.  She previously smoked 2 cigarettes daily 2003 through 2011. She drinks 1 glass of wine once monthly or less.  Previously smoked marijuana, none since 2022.    Family History:  family history includes Breast cancer in her mother and paternal grandmother; Colon cancer in her maternal uncle; Diabetes in her father; Heart disease in her father; Hypertension in her father, maternal grandmother, and mother; Kidney failure in her father; Liver disease in her brother; Ovarian cancer in her paternal grandmother; Pancreatic cancer in her maternal grandmother. Brother with history of liver disease.   Allergies  Allergen Reactions   Feraheme [Ferumoxytol] Shortness Of Breath    Infusion reaction 11/2022.  Shortness of breath / coughing.  Treated with albuterol, steroids, famotidine.   Peanut-Containing Drug Products Anaphylaxis   Penicillins Other (See Comments)    Unknown reaction      Outpatient Encounter Medications as of 02/15/2023  Medication Sig   rivaroxaban (XARELTO) 10 MG TABS tablet Take  10 mg by mouth daily.   ferrous sulfate 325 (65 FE) MG tablet Take 325 mg by mouth daily with breakfast.   No facility-administered encounter medications on file as of 02/15/2023.   REVIEW OF SYSTEMS:  Gen: Denies fever, sweats or chills. No weight loss.  CV: + Leg/feet swelling. Denies chest pain or palpitations. Resp: Denies cough, shortness of breath of hemoptysis.  GI: Denies heartburn, dysphagia, stomach or lower abdominal pain. No diarrhea or constipation.  NG:EXBMWU urinary burning, blood in urine, increased urinary frequency or incontinence. GYN: Menstrual pain.  MS: + Back pain.  Derm: Denies rash, itchiness, skin lesions or unhealing ulcers. Psych: + Anxiety and depression. Heme: Denies bruising, easy bleeding. Neuro: + Headaches.  Endo:  Denies any problems with DM, thyroid or adrenal function.  PHYSICAL EXAM: BP 120/80   Pulse 89   Ht 5\' 2"  (1.575 m)   Wt 238 lb (108 kg)   LMP 01/13/2023 (Exact Date)   BMI 43.53 kg/m  General: 37 year old female in no acute distress. Head: Normocephalic and atraumatic. Eyes:  Sclerae non-icteric, conjunctive pink. Ears: Normal auditory acuity. Mouth: Dentition intact. No ulcers or lesions.  Neck: Supple, no lymphadenopathy or thyromegaly.  Lungs: Clear bilaterally to auscultation without wheezes, crackles or rhonchi. Heart: Regular rate and rhythm. No murmur, rub or gallop appreciated.  Abdomen: Soft, nontender, nondistended. No masses. No hepatosplenomegaly. Normoactive bowel sounds x 4 quadrants.  Rectal: Deferred.  Musculoskeletal: Symmetrical with no gross deformities. Skin: Warm and dry. No rash or lesions on visible extremities. Extremities: No edema. Neurological: Alert oriented x 4, no focal deficits.  Psychological:  Alert and cooperative. Normal mood and affect.  ASSESSMENT AND PLAN:  37 year old female with elevated alk phos levels since 2022 with normal total bili, AST/ALT levels.  Asymptomatic. -Alk phos level,  GGT. If GGT level elevated that would indicate alk phos release likely from the liver and further laboratory studies to include ANA and AMA will be ordered. -RUQ sonogram to evaluate the gallbladder and common biliary duct  Acid reflux -GERD diet discussed, handout given to the patient -Exercise as tolerated and weight loss recommended -Famotidine 20 mg 1 p.o. daily, not to be taken within 4 hours of Xarelto.  Patient stated she will contact her hematologist to further discuss taking famotidine as she does not want any medication to interfere with the effectiveness of Xarelto.  Factor V Leiden deficiency with LLE DVT/PE on Xarelto.  She required  Lovenox throughout her pregnancy. -Continue follow-up with hematology  Iron deficiency anemia, secondary to heavy menstrual bleeding per hematology.  On ferrous sulfate 325 mg daily.  She received Feraheme IV x 1 which resulted in an allergic reaction. No overt GI bleeding. -Continue follow-up with hematology  Thrombocytosis, likely due to IDA -Continue follow-up with hematology    CC:  Rema [name], NP

## 2023-02-15 NOTE — Patient Instructions (Signed)
Your provider has requested that you go to the basement level for lab work before leaving today. Press "B" on the elevator. The lab is located at the first door on the left as you exit the elevator.  You have been scheduled for an abdominal ultrasound at [facility] (1st floor of hospital) on 02/18/23 at 8:30am. Please arrive 30 minutes prior to your appointment for registration. Make certain not to have anything to eat or drink 6 hours prior to your appointment. Should you need to reschedule your appointment, please contact radiology at [phone]. This test typically takes about 30 minutes to perform.  Please check with your hematologist to see if it's ok to take your pepcid 20 mg 1 tablet daily.   _______________________________________________________  If your blood pressure at your visit was 140/90 or greater, please contact your primary care physician to follow up on this.  _______________________________________________________  If you are age 26 or older, your body mass index should be between 23-30. Your Body mass index is 43.53 kg/m. If this is out of the aforementioned range listed, please consider follow up with your Primary Care Provider.  If you are age 74 or younger, your body mass index should be between 19-25. Your Body mass index is 43.53 kg/m. If this is out of the aformentioned range listed, please consider follow up with your Primary Care Provider.   ________________________________________________________  The [facility] [place] [facility] providers would like to encourage you to use [facility] to communicate with providers for non-urgent requests or questions.  Due to long hold times on the telephone, sending your provider a message by [facility] may be a faster and more efficient way to get a response.  Please allow 48 business hours for a response.  Please remember that this is for non-urgent requests.  _______________________________________________________  Thank you for trusting  me with your gastrointestinal care!   Alcide Evener, CRNP    Gastroesophageal Reflux Disease, Adult Gastroesophageal reflux (GER) happens when acid from the stomach flows up into the tube that connects the mouth and the stomach (esophagus). Normally, food travels down the esophagus and stays in the stomach to be digested. However, when a person has GER, food and stomach acid sometimes move back up into the esophagus. If this becomes a more serious problem, the person may be diagnosed with a disease called gastroesophageal reflux disease (GERD). GERD occurs when the reflux: Happens often. Causes frequent or severe symptoms. Causes problems such as damage to the esophagus. When stomach acid comes in contact with the esophagus, the acid may cause inflammation in the esophagus. Over time, GERD may create small holes (ulcers) in the lining of the esophagus. What are the causes? This condition is caused by a problem with the muscle between the esophagus and the stomach (lower esophageal sphincter, or LES). Normally, the LES muscle closes after food passes through the esophagus to the stomach. When the LES is weakened or abnormal, it does not close properly, and that allows food and stomach acid to go back up into the esophagus. The LES can be weakened by certain dietary substances, medicines, and medical conditions, including: Tobacco use. Pregnancy. Having a hiatal hernia. Alcohol use. Certain foods and beverages, such as coffee, chocolate, onions, and peppermint. What increases the risk? You are more likely to develop this condition if you: Have an increased body weight. Have a connective tissue disorder. Take NSAIDs, such as ibuprofen. What are the signs or symptoms? Symptoms of this condition include: Heartburn. Difficult or painful swallowing  and the feeling of having a lump in the throat. A bitter taste in the mouth. Bad breath and having a large amount of saliva. Having an  upset or bloated stomach and belching. Chest pain. Different conditions can cause chest pain. Make sure you see your health care provider if you experience chest pain. Shortness of breath or wheezing. Ongoing (chronic) cough or a nighttime cough. Wearing away of tooth enamel. Weight loss. How is this diagnosed? This condition may be diagnosed based on a medical history and a physical exam. To determine if you have mild or severe GERD, your health care provider may also monitor how you respond to treatment. You may also have tests, including: A test to examine your stomach and esophagus with a small camera (endoscopy). A test that measures the acidity level in your esophagus. A test that measures how much pressure is on your esophagus. A barium swallow or modified barium swallow test to show the shape, size, and functioning of your esophagus. How is this treated? Treatment for this condition may vary depending on how severe your symptoms are. Your health care provider may recommend: Changes to your diet. Medicine. Surgery. The goal of treatment is to help relieve your symptoms and to prevent complications. Follow these instructions at home: Eating and drinking  Follow a diet as recommended by your health care provider. This may involve avoiding foods and drinks such as: Coffee and tea, with or without caffeine. Drinks that contain alcohol. Energy drinks and sports drinks. Carbonated drinks or sodas. Chocolate and cocoa. Peppermint and mint flavorings. Garlic and onions. Horseradish. Spicy and acidic foods, including peppers, chili powder, curry powder, vinegar, hot sauces, and barbecue sauce. Citrus fruit juices and citrus fruits, such as oranges, lemons, and limes. Tomato-based foods, such as red sauce, chili, salsa, and pizza with red sauce. Fried and fatty foods, such as donuts, french fries, potato chips, and high-fat dressings. High-fat meats, such as hot dogs and fatty cuts of  red and Juste meats, such as rib eye steak, sausage, ham, and bacon. High-fat dairy items, such as whole milk, butter, and cream cheese. Eat small, frequent meals instead of large meals. Avoid drinking large amounts of liquid with your meals. Avoid eating meals during the 2-3 hours before bedtime. Avoid lying down right after you eat. Do not exercise right after you eat. Lifestyle  Do not use any products that contain nicotine or tobacco. These products include cigarettes, chewing tobacco, and vaping devices, such as e-cigarettes. If you need help quitting, ask your health care provider. Try to reduce your stress by using methods such as yoga or meditation. If you need help reducing stress, ask your health care provider. If you are overweight, reduce your weight to an amount that is healthy for you. Ask your health care provider for guidance about a safe weight loss goal. General instructions Pay attention to any changes in your symptoms. Take over-the-counter and prescription medicines only as told by your health care provider. Do not take aspirin, ibuprofen, or other NSAIDs unless your health care provider told you to take these medicines. Wear loose-fitting clothing. Do not wear anything tight around your waist that causes pressure on your abdomen. Raise (elevate) the head of your bed about 6 inches (15 cm). You can use a wedge to do this. Avoid bending over if this makes your symptoms worse. Keep all follow-up visits. This is important. Contact a health care provider if: You have: New symptoms. Unexplained weight loss. Difficulty swallowing or  it hurts to swallow. Wheezing or a persistent cough. A hoarse voice. Your symptoms do not improve with treatment. Get help right away if: You have sudden pain in your arms, neck, jaw, teeth, or back. You suddenly feel sweaty, dizzy, or light-headed. You have chest pain or shortness of breath. You vomit and the vomit is green, yellow, or  black, or it looks like blood or coffee grounds. You faint. You have stool that is red, bloody, or black. You cannot swallow, drink, or eat. These symptoms may represent a serious problem that is an emergency. Do not wait to see if the symptoms will go away. Get medical help right away. Call your local emergency services (911 in the [place] Do not drive yourself to the hospital. Summary Gastroesophageal reflux happens when acid from the stomach flows up into the esophagus. GERD is a disease in which the reflux happens often, causes frequent or severe symptoms, or causes problems such as damage to the esophagus. Treatment for this condition may vary depending on how severe your symptoms are. Your health care provider may recommend diet and lifestyle changes, medicine, or surgery. Contact a health care provider if you have new or worsening symptoms. Take over-the-counter and prescription medicines only as told by your health care provider. Do not take aspirin, ibuprofen, or other NSAIDs unless your health care provider told you to do so. Keep all follow-up visits as told by your health care provider. This is important. This information is not intended to replace advice given to you by your health care provider. Make sure you discuss any questions you have with your health care provider. Document Revised: 12/15/2019 Document Reviewed: 12/17/2019 Elsevier Patient Education  2024 [employer].

## 2023-02-16 ENCOUNTER — Ambulatory Visit (INDEPENDENT_AMBULATORY_CARE_PROVIDER_SITE_OTHER): Payer: Medicaid Other | Admitting: Podiatry

## 2023-02-16 DIAGNOSIS — Z91199 Patient's noncompliance with other medical treatment and regimen due to unspecified reason: Secondary | ICD-10-CM

## 2023-02-16 NOTE — Progress Notes (Signed)
No show

## 2023-02-16 NOTE — Progress Notes (Signed)
Pl make sure she has FU Agree with assessment/plan.  Edman Circle, MD [facility] [phone]

## 2023-02-17 ENCOUNTER — Telehealth: Payer: Self-pay

## 2023-02-17 NOTE — Telephone Encounter (Signed)
Author: Arnaldo Natal, NP Service: Gastroenterology Author Type: Nurse Practitioner  Filed: 02/17/2023  5:18 AM Encounter Date: 02/15/2023 Status: Signed  Editor: Arnaldo Natal, NP (Nurse Practitioner)   Viviann Spare, pls contact patient and schedule her for an office visit with Dr. Chales Abrahams. See his addendum to this office visit note. Patient to proceed with RUQ sono as scheduled, I will contact her with results once received. THX.

## 2023-02-17 NOTE — Progress Notes (Signed)
Christine Wong, pls contact patient and schedule her for an office visit with Dr. Chales Abrahams. See his addendum to this office visit note. Patient to proceed with RUQ sono as scheduled, I will contact her with results once received. THX.

## 2023-02-17 NOTE — Telephone Encounter (Signed)
Left message for patient to call back  

## 2023-02-18 ENCOUNTER — Ambulatory Visit (HOSPITAL_COMMUNITY): Admission: RE | Admit: 2023-02-18 | Payer: Medicaid Other | Source: Ambulatory Visit

## 2023-02-18 NOTE — Telephone Encounter (Signed)
Left message for patient to call back  

## 2023-02-24 NOTE — Telephone Encounter (Signed)
Appointment scheduled for 05/12/23 at 11:20 am with Dr. Chales Abrahams. Patient is active on mychart & message was sent. Provided office number and asked that she call back to reschedule/cancel or if she has any questions.

## 2023-03-04 ENCOUNTER — Other Ambulatory Visit: Payer: Medicaid Other

## 2023-03-08 ENCOUNTER — Encounter: Payer: Self-pay | Admitting: Physician Assistant

## 2023-03-15 ENCOUNTER — Encounter (HOSPITAL_COMMUNITY): Payer: Self-pay

## 2023-03-15 ENCOUNTER — Ambulatory Visit (INDEPENDENT_AMBULATORY_CARE_PROVIDER_SITE_OTHER): Payer: Medicaid Other | Admitting: Clinical

## 2023-03-15 DIAGNOSIS — F330 Major depressive disorder, recurrent, mild: Secondary | ICD-10-CM | POA: Diagnosis not present
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
## 2023-04-12 ENCOUNTER — Ambulatory Visit (HOSPITAL_COMMUNITY): Payer: Medicaid Other | Admitting: Clinical

## 2023-04-29 ENCOUNTER — Encounter: Payer: Medicaid Other | Admitting: Family

## 2023-04-29 ENCOUNTER — Ambulatory Visit (HOSPITAL_COMMUNITY): Payer: Medicaid Other | Admitting: Clinical

## 2023-04-29 NOTE — Progress Notes (Signed)
Erroneous encounter-disregard

## 2023-05-11 ENCOUNTER — Ambulatory Visit (INDEPENDENT_AMBULATORY_CARE_PROVIDER_SITE_OTHER): Payer: Medicaid Other | Admitting: Clinical

## 2023-05-11 DIAGNOSIS — F330 Major depressive disorder, recurrent, mild: Secondary | ICD-10-CM

## 2023-05-12 ENCOUNTER — Ambulatory Visit: Payer: Medicaid Other | Admitting: Gastroenterology

## 2023-05-14 NOTE — Progress Notes (Signed)
THERAPIST PROGRESS NOTE Virtual Visit via Video Note  I connected with Christine Wong on 05/11/2023 at  4:00 PM EST by a video enabled telemedicine application and verified that I am speaking with the correct person using two identifiers.  Location: Patient: work Provider: office   I discussed the limitations of evaluation and management by telemedicine and the availability of in person appointments. The patient expressed understanding and agreed to proceed.   Follow Up Instructions: I discussed the assessment and treatment plan with the patient. The patient was provided an opportunity to ask questions and all were answered. The patient agreed with the plan and demonstrated an understanding of the instructions.   The patient was advised to call back or seek an in-person evaluation if the symptoms worsen or if the condition fails to improve as anticipated.    Session Time: 30 minutes  Participation Level: Active  Behavioral Response: CasualAlertIrritable  Type of Therapy: Individual Therapy  Treatment Goals addressed: Christine Wong will practice behavioral activation skills 3 times per week for the next 12 weeks   ProgressTowards Goals: Progressing  Interventions: CBT  Summary:  Christine Wong is a 37 y.o. female who presents for the scheduled appointment oriented x 5, appropriately dressed, and friendly.  Client denied hallucinations and delusions. Client reported on today she has been feeling irritability and very stressed.  Client reported she has had a hard time with keeping her therapy appointments amongst other things due to her work schedule.  Client reported within her role at work they have given her an excess amount of work to do that should not be done by 1 person.  Client reported she often has to work through lunch and sometimes goes into work on Saturdays which she should not be doing to catch up on work.  Client reported she has spoken with upper management about the  issue.  Client reported 1 manager told her that the company would "lose trust" in her if she went on to express her complaint about the amount of work she has to do in her role.  Client reported she feels like she is at the point where she wants to quit.  Client reported however she has been putting in job applications and will continue to do so when she takes some time off for Thanksgiving. Evidence of progress towards goal: Client reported 1 positive of putting in job applications.   Suicidal/Homicidal: Nowithout intent/plan  Therapist Response:  Therapist began the appointment asking the client how she has been doing since last seen. Therapist used CBT to engage using active listening and positive emotional support. Therapist used CBT to ask the client and the father source of her stress and negative emotions. Therapist used CBT to normalize the clients emotional response to the stress within reason. Therapist used CBT to engage the client and reinforce problem-solving skills and reframing her negative thoughts. Therapist used CBT ask the client to identify her progress with frequency of use with coping skills with continued practice in her daily activity.    Therapist assigned client homework to practice self-care    Plan: Return again in 4 weeks.  Diagnosis: Major depressive disorder, recurrent episode, mild with anxious distress  Collaboration of Care: Patient refused AEB none requested by the client.  Patient/Guardian was advised Release of Information must be obtained prior to any record release in order to collaborate their care with an outside provider. Patient/Guardian was advised if they have not already done so to contact the registration department  to sign all necessary forms in order for [place] to release information regarding their care.   Consent: Patient/Guardian gives verbal consent for treatment and assignment of benefits for services provided during this visit.  Patient/Guardian expressed understanding and agreed to proceed.   Neena Rhymes Jermon Chalfant, LCSW 05/11/2023

## 2023-06-03 ENCOUNTER — Ambulatory Visit: Payer: Medicaid Other | Admitting: Hematology and Oncology

## 2023-06-03 ENCOUNTER — Other Ambulatory Visit: Payer: Medicaid Other

## 2023-06-23 ENCOUNTER — Ambulatory Visit (HOSPITAL_COMMUNITY): Payer: Medicaid Other | Admitting: Clinical

## 2023-06-23 DIAGNOSIS — F330 Major depressive disorder, recurrent, mild: Secondary | ICD-10-CM | POA: Diagnosis not present

## 2023-06-23 NOTE — Progress Notes (Signed)
 THERAPIST PROGRESS NOTE Virtual Visit via Video Note  I connected with Christine Wong on 06/23/23 at  8:00 AM EST by a video enabled telemedicine application and verified that I am speaking with the correct person using two identifiers.  Location: Patient: home Provider: Office   I discussed the limitations of evaluation and management by telemedicine and the availability of in person appointments. The patient expressed understanding and agreed to proceed.   Follow Up Instructions: I discussed the assessment and treatment plan with the patient. The patient was provided an opportunity to ask questions and all were answered. The patient agreed with the plan and demonstrated an understanding of the instructions.   The patient was advised to call back or seek an in-person evaluation if the symptoms worsen or if the condition fails to improve as anticipated.   Session Time: 40 minutes  Participation Level: Active  Behavioral Response: CasualAlertIrritable  Type of Therapy: Individual Therapy  Treatment Goals addressed:  Christine Wong will practice behavioral activation skills 3 times per week for the next 12 weeks   ProgressTowards Goals: Progressing  Interventions: CBT and Supportive  Summary:  Christine Wong is a 38 y.o. female who presents for scheduled appointment oriented x 5, appropriately dressed, and friendly.  Christine Wong denied hallucinations and delusions. Christine Wong reported on today things have been going about the same.  Christine Wong reported she still has frustrations with her job of overworking her and being underpaid.  Christine Wong reported she also feels like they are doing scare tactics such as posting positions that people are currently working to get them to do better.  Christine Wong reported her mother has been a stressor living with her.  Christine Wong reported her mom projects a lot of anxiety towards her and questions a lot of things that she has done.  Christine Wong reported she thinks her mother does  that in part as well because she knows if she goes off and has her mindset on something that she will not communicate with her but every so often.  Christine Wong reported before her father passed her father told her not to tell her mother everything because she also gossips about her.  Christine Wong reported she is looking for new work but has been feeling discouraged as she has not come across anything yet. Evidence of progress towards goal: Christine Wong reported 1 positive of working towards her goal of employment I reframing her mindset and applying for other positions.   Suicidal/Homicidal: Nowithout intent/plan  Therapist Response:  Therapist began the appointment asking the Christine Wong how she has been doing since last seen. Therapist used CBT to engage with active listening and positive emotional support. Therapist used CBT to ask the Christine Wong open-ended questions about how she is progressing with previously noted stressors related to family and work. Therapist used CBT to normalize the clients emotional response within reason. Therapist used CBT to engage Christine Wong in identifying her negative cognitive patterns and working to reframe her negative mindset. Therapist used CBT ask the Christine Wong to identify her progress with frequency of use with coping skills with continued practice in her daily activity.    Therapist used CBT to assigned Christine Wong homework to practice self-care.   Plan: Return again in 3 weeks.  Diagnosis: Major depressive disorder, mild, with anxious distress  Collaboration of Care: Patient refused AEB none requested by the Christine Wong.  Patient/Guardian was advised Release of Information must be obtained prior to any record release in order to collaborate their care with an outside provider. Patient/Guardian was advised if  they have not already done so to contact the registration department to sign all necessary forms in order for us  to release information regarding their care.   Consent: Patient/Guardian  gives verbal consent for treatment and assignment of benefits for services provided during this visit. Patient/Guardian expressed understanding and agreed to proceed.   Jhane Lorio Y Apryl Brymer, LCSW 06/23/2023

## 2023-06-27 ENCOUNTER — Encounter (INDEPENDENT_AMBULATORY_CARE_PROVIDER_SITE_OTHER): Payer: Medicaid Other | Admitting: Physician Assistant

## 2023-07-01 ENCOUNTER — Inpatient Hospital Stay: Payer: Medicaid Other

## 2023-07-01 ENCOUNTER — Other Ambulatory Visit: Payer: Self-pay | Admitting: Hematology and Oncology

## 2023-07-01 DIAGNOSIS — D500 Iron deficiency anemia secondary to blood loss (chronic): Secondary | ICD-10-CM

## 2023-07-04 ENCOUNTER — Inpatient Hospital Stay: Payer: Medicaid Other | Attending: Hematology and Oncology

## 2023-07-04 DIAGNOSIS — K5900 Constipation, unspecified: Secondary | ICD-10-CM | POA: Insufficient documentation

## 2023-07-04 DIAGNOSIS — Z841 Family history of disorders of kidney and ureter: Secondary | ICD-10-CM | POA: Diagnosis not present

## 2023-07-04 DIAGNOSIS — Z800 Family history of malignant neoplasm of digestive organs: Secondary | ICD-10-CM | POA: Insufficient documentation

## 2023-07-04 DIAGNOSIS — Z87891 Personal history of nicotine dependence: Secondary | ICD-10-CM | POA: Diagnosis not present

## 2023-07-04 DIAGNOSIS — N920 Excessive and frequent menstruation with regular cycle: Secondary | ICD-10-CM | POA: Insufficient documentation

## 2023-07-04 DIAGNOSIS — E611 Iron deficiency: Secondary | ICD-10-CM | POA: Insufficient documentation

## 2023-07-04 DIAGNOSIS — Z8249 Family history of ischemic heart disease and other diseases of the circulatory system: Secondary | ICD-10-CM | POA: Diagnosis not present

## 2023-07-04 DIAGNOSIS — D75839 Thrombocytosis, unspecified: Secondary | ICD-10-CM | POA: Insufficient documentation

## 2023-07-04 DIAGNOSIS — Z7901 Long term (current) use of anticoagulants: Secondary | ICD-10-CM | POA: Diagnosis not present

## 2023-07-04 DIAGNOSIS — Z8041 Family history of malignant neoplasm of ovary: Secondary | ICD-10-CM | POA: Diagnosis not present

## 2023-07-04 DIAGNOSIS — Z8379 Family history of other diseases of the digestive system: Secondary | ICD-10-CM | POA: Insufficient documentation

## 2023-07-04 DIAGNOSIS — Z833 Family history of diabetes mellitus: Secondary | ICD-10-CM | POA: Insufficient documentation

## 2023-07-04 DIAGNOSIS — Z880 Allergy status to penicillin: Secondary | ICD-10-CM | POA: Insufficient documentation

## 2023-07-04 DIAGNOSIS — D6851 Activated protein C resistance: Secondary | ICD-10-CM | POA: Insufficient documentation

## 2023-07-04 DIAGNOSIS — D500 Iron deficiency anemia secondary to blood loss (chronic): Secondary | ICD-10-CM

## 2023-07-04 DIAGNOSIS — Z803 Family history of malignant neoplasm of breast: Secondary | ICD-10-CM | POA: Diagnosis not present

## 2023-07-04 LAB — CBC WITH DIFFERENTIAL (CANCER CENTER ONLY)
Abs Immature Granulocytes: 0.04 10*3/uL (ref 0.00–0.07)
Basophils Absolute: 0.1 10*3/uL (ref 0.0–0.1)
Basophils Relative: 1 %
Eosinophils Absolute: 0.5 10*3/uL (ref 0.0–0.5)
Eosinophils Relative: 4 %
HCT: 36.8 % (ref 36.0–46.0)
Hemoglobin: 12.6 g/dL (ref 12.0–15.0)
Immature Granulocytes: 0 %
Lymphocytes Relative: 16 %
Lymphs Abs: 1.8 10*3/uL (ref 0.7–4.0)
MCH: 28.3 pg (ref 26.0–34.0)
MCHC: 34.2 g/dL (ref 30.0–36.0)
MCV: 82.7 fL (ref 80.0–100.0)
Monocytes Absolute: 1 10*3/uL (ref 0.1–1.0)
Monocytes Relative: 9 %
Neutro Abs: 7.6 10*3/uL (ref 1.7–7.7)
Neutrophils Relative %: 70 %
Platelet Count: 347 10*3/uL (ref 150–400)
RBC: 4.45 MIL/uL (ref 3.87–5.11)
RDW: 12.9 % (ref 11.5–15.5)
WBC Count: 11 10*3/uL — ABNORMAL HIGH (ref 4.0–10.5)
nRBC: 0 % (ref 0.0–0.2)

## 2023-07-04 LAB — CMP (CANCER CENTER ONLY)
ALT: 10 U/L (ref 0–44)
AST: 16 U/L (ref 15–41)
Albumin: 4 g/dL (ref 3.5–5.0)
Alkaline Phosphatase: 133 U/L — ABNORMAL HIGH (ref 38–126)
Anion gap: 7 (ref 5–15)
BUN: 9 mg/dL (ref 6–20)
CO2: 25 mmol/L (ref 22–32)
Calcium: 9.5 mg/dL (ref 8.9–10.3)
Chloride: 103 mmol/L (ref 98–111)
Creatinine: 0.62 mg/dL (ref 0.44–1.00)
GFR, Estimated: >60 mL/min (ref >60)
Glucose, Bld: 84 mg/dL (ref 70–99)
Potassium: 4.1 mmol/L (ref 3.5–5.1)
Sodium: 135 mmol/L (ref 135–145)
Total Bilirubin: 0.3 mg/dL (ref 0.0–1.2)
Total Protein: 7.2 g/dL (ref 6.5–8.1)

## 2023-07-04 LAB — RETIC PANEL
Immature Retic Fract: 13.7 % (ref 2.3–15.9)
RBC.: 4.53 MIL/uL (ref 3.87–5.11)
Retic Count, Absolute: 88.8 10*3/uL (ref 19.0–186.0)
Retic Ct Pct: 2 % (ref 0.4–3.1)
Reticulocyte Hemoglobin: 31.7 pg (ref >27.9)

## 2023-07-04 LAB — IRON AND IRON BINDING CAPACITY (CC-WL,HP ONLY)
Iron: 48 ug/dL (ref 28–170)
Saturation Ratios: 14 % (ref 10.4–31.8)
TIBC: 343 ug/dL (ref 250–450)
UIBC: 295 ug/dL (ref 148–442)

## 2023-07-05 LAB — FERRITIN: Ferritin: 33 ng/mL (ref 11–307)

## 2023-07-08 ENCOUNTER — Inpatient Hospital Stay (HOSPITAL_BASED_OUTPATIENT_CLINIC_OR_DEPARTMENT_OTHER): Payer: Medicaid Other | Admitting: Hematology and Oncology

## 2023-07-08 VITALS — BP 124/82 | HR 91 | Temp 97.1°F | Resp 16 | Wt 238.7 lb

## 2023-07-08 DIAGNOSIS — D6851 Activated protein C resistance: Secondary | ICD-10-CM | POA: Diagnosis not present

## 2023-07-08 DIAGNOSIS — D500 Iron deficiency anemia secondary to blood loss (chronic): Secondary | ICD-10-CM

## 2023-07-08 DIAGNOSIS — I8290 Acute embolism and thrombosis of unspecified vein: Secondary | ICD-10-CM | POA: Diagnosis not present

## 2023-07-08 NOTE — Progress Notes (Unsigned)
[facility] [phone]   [phone]  PROGRESS NOTE  Patient Care Team: Christine [name], NP as PCP - General (Nurse Practitioner) Christine Fus, MD as PCP - Cardiology (Cardiology)  Hematological/Oncological History # Heterozygous Factor V Leiden with Hx of VTE/PE # VTE in Pregnancy  08/2019: patient diagnosed with LLE DVT and PE while in [place]. Started on Xarelto therapy. Recommended indefinite anticoagulation due to unprovoked nature of VTE. Diagnosed with heterozygous FVL at that time.  03/11/2021: establish care with Dr. Leonides Wong  12/10/2022: IV feraheme 510 mg x 1 dose  Interval History:  Christine Wong 38 y.o. female with medical history significant for heterozygous factor V Leiden with a history of VTE/PE who presents for a follow up visit. Patient's last visit was on 01/03/2023. In the interim, she has continued on lovenox therapy.   On exam today Christine Wong reports she has been okay in the interim since her last visit 6 months ago.  She reports that she occasionally feels some "knots in her legs".  She notes that if she were to cut her finger she would "bleed a lot".  She notes that applying pressure does adequately stop the bleeding.  She reports her menstrual cycles have been fine though she does pass a few clots on occasion.  She notes that her energy levels have been good.  She is not currently taking a dedicated iron pill but is taking a multivitamin, including one that contains biotin and magnesium.  She reports that she has not been having any trouble with lightheadedness, dizziness, shortness of breath.  She reports that she is currently getting over a cold as well as a urinary tract infection for which she was taking antibiotics..  She denies fevers, chills, sweats, shortness of breath, chest pain, cough, headaches or dizziness. She has no other complaints. Full 10 point ROS is listed below.   MEDICAL HISTORY:  Past Medical History:  Diagnosis  Date   Anxiety    DVT (deep venous thrombosis) (HCC) 08/2019   HPV (human papilloma virus) infection    Seasonal allergies    Vaginal Pap smear, abnormal     SURGICAL HISTORY: Past Surgical History:  Procedure Laterality Date   CESAREAN SECTION N/A 05/22/2021   Procedure: CESAREAN SECTION;  Surgeon: Mitchel Honour, DO;  Location: [facility];  Service: Obstetrics;  Laterality: N/A;   WISDOM TOOTH EXTRACTION  2014    SOCIAL HISTORY: Social History   Socioeconomic History   Marital status: Single    Spouse name: Not on file   Number of children: 1   Years of education: Not on file   Highest education level: Not on file  Occupational History   Occupation: accounting  Tobacco Use   Smoking status: Former    Current packs/day: 0.00    Types: Cigarettes    Quit date: 2009    Years since quitting: 16.0   Smokeless tobacco: Never  Vaping Use   Vaping status: Never Used  Substance and Sexual Activity   Alcohol use: Yes    Comment: socially   Drug use: Yes    Types: Marijuana    Comment: socially last use 9 months ago   Sexual activity: Yes    Partners: Female    Birth control/protection: None  Other Topics Concern   Not on file  Social History Narrative   Not on file   Social Drivers of Health   Financial Resource Strain: Not on file  Food Insecurity: Not  on file  Transportation Needs: Not on file  Physical Activity: Not on file  Stress: Low Risk  (01/26/2020)   Received from [employer]   Stress    Stress Risk: Not on file    General Anxiety Disorder (GAD-7) Score: Not on file  Social Connections: Not on file  Intimate Partner Violence: Not on file    FAMILY HISTORY: Family History  Problem Relation Age of Onset   Hypertension Mother    Breast cancer Mother    Diabetes Father    Kidney failure Father    Heart disease Father    Hypertension Father    Liver disease Brother    Pancreatic cancer Maternal Grandmother    Hypertension  Maternal Grandmother    Breast cancer Paternal Grandmother    Ovarian cancer Paternal Grandmother    Colon cancer Maternal Uncle    Esophageal cancer Neg Hx     ALLERGIES:  is allergic to feraheme [ferumoxytol], peanut-containing drug products, and penicillins.  MEDICATIONS:  Current Outpatient Medications  Medication Sig Dispense Refill   ferrous sulfate 325 (65 FE) MG tablet Take 325 mg by mouth daily with breakfast.     rivaroxaban (XARELTO) 10 MG TABS tablet Take 10 mg by mouth daily.     No current facility-administered medications for this visit.    REVIEW OF SYSTEMS:   Constitutional: ( - ) fevers, ( - )  chills , ( - ) night sweats Eyes: ( - ) blurriness of vision, ( - ) double vision, ( - ) watery eyes Ears, nose, mouth, throat, and face: ( - ) mucositis, ( - ) sore throat Respiratory: ( - ) cough, ( - ) dyspnea, ( - ) wheezes Cardiovascular: ( - ) palpitation, ( - ) chest discomfort, ( - ) lower extremity swelling Gastrointestinal:  ( - ) nausea, ( - ) heartburn, ( - ) change in bowel habits Skin: ( - ) abnormal skin rashes Lymphatics: ( - ) new lymphadenopathy, ( - ) easy bruising Neurological: ( - ) numbness, ( - ) tingling, ( - ) new weaknesses Behavioral/Psych: ( - ) mood change, ( - ) new changes  All other systems were reviewed with the patient and are negative.  PHYSICAL EXAMINATION: ECOG PERFORMANCE STATUS: 0 - Asymptomatic  There were no vitals filed for this visit. There were no vitals filed for this visit.  GENERAL: Well appearing pregnant African-American female, alert, no distress and comfortable SKIN: skin color, texture, turgor are normal, no rashes or significant lesions EYES: conjunctiva are pink and non-injected, sclera clear LUNGS: clear to auscultation and percussion with normal breathing effort HEART: regular rate & rhythm and no murmurs and no lower extremity edema Musculoskeletal: no cyanosis of digits and no clubbing  PSYCH: alert &  oriented x 3, fluent speech NEURO: no focal motor/sensory deficits  LABORATORY DATA:  I have reviewed the data as listed    Latest Ref Rng & Units 07/04/2023    3:26 PM 01/03/2023   11:36 AM 11/30/2022    2:45 PM  CBC  WBC 4.0 - 10.5 K/uL 11.0  8.3  9.7   Hemoglobin 12.0 - 15.0 g/dL 69.6  29.5  28.4   Hematocrit 36.0 - 46.0 % 36.8  39.1  38.5   Platelets 150 - 400 K/uL 347  468  522        Latest Ref Rng & Units 07/04/2023    3:26 PM 02/15/2023    3:17 PM 01/25/2023  11:08 AM  CMP  Glucose 70 - 99 mg/dL 84   71   BUN 6 - 20 mg/dL 9   12   Creatinine 8.46 - 1.00 mg/dL 9.62   9.52   Sodium 841 - 145 mmol/L 135   138   Potassium 3.5 - 5.1 mmol/L 4.1   4.7   Chloride 98 - 111 mmol/L 103   101   CO2 22 - 32 mmol/L 25   22   Calcium 8.9 - 10.3 mg/dL 9.5   9.5   Total Protein 6.5 - 8.1 g/dL 7.2   7.5   Total Bilirubin 0.0 - 1.2 mg/dL 0.3   0.4   Alkaline Phos 38 - 126 U/L 133  134  148   AST 15 - 41 U/L 16   19   ALT 0 - 44 U/L 10   11     RADIOGRAPHIC STUDIES: No results found.    ASSESSMENT & PLAN Christine Wong is a 38 y.o. female with medical history significant for heterozygous factor V Leiden with a history of VTE/PE who presents for a follow up visit.    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 Started on Xarelto therapy. Recommended indefinite anticoagulation due to unprovoked nature of VTE. Diagnosed with heterozygous FVL at that time.  --Completed therapeutic dose lovenox during pregnancy and breastfeeding in 2022-2023. --continue Xarelto 10 mg PO daily --Labs today show WBC 11.0, he globin 12.6, MCV 82.7, platelets 347.  Ferritin is 33 with iron sat of 14%  #Thrombocytosis #Iron deficiency: --Secondary to heavy menstrual bleeding. --Unable to tolerate PO iron due to constipation.   --patient received IV feraheme 510 mg x 1 dose on  12/10/2022.  She had a severe infusion reaction with this. -- Patient notes that she is up to receiving another brand of IV iron therapy. -- If ferritin is persistently low will pursue an additional round of an alternative IV iron.  Follow up: 6 months: labs and follow up (will see sooner if more IV iron is required)   No orders of the defined types were placed in this encounter.  All questions were answered. The patient knows to call the clinic with any problems, questions or concerns.  I have spent a total of 30 minutes minutes of face-to-face and non-face-to-face time, preparing to see the patient,performing a medically appropriate examination, counseling and educating the patient, ordering medications/tests/procedures, documenting clinical information in the electronic health record, and care coordination.   Ulysees Barns, MD Department of Hematology/Oncology [facility] Phone: [phone] Pager: [phone] Email: [email or web address]  07/08/2023 3:41 PM

## 2023-07-21 ENCOUNTER — Ambulatory Visit (INDEPENDENT_AMBULATORY_CARE_PROVIDER_SITE_OTHER): Payer: Medicaid Other | Admitting: Clinical

## 2023-07-21 DIAGNOSIS — F330 Major depressive disorder, recurrent, mild: Secondary | ICD-10-CM | POA: Diagnosis not present

## 2023-07-23 ENCOUNTER — Ambulatory Visit (HOSPITAL_COMMUNITY)
Admission: EM | Admit: 2023-07-23 | Discharge: 2023-07-23 | Disposition: A | Discharge status: Home / Self Care | Payer: Medicaid Other

## 2023-07-23 ENCOUNTER — Encounter (HOSPITAL_COMMUNITY): Payer: Self-pay

## 2023-07-23 DIAGNOSIS — J111 Influenza due to unidentified influenza virus with other respiratory manifestations: Secondary | ICD-10-CM

## 2023-07-23 MED ORDER — ACETAMINOPHEN 325 MG PO TABS
650.0000 mg | ORAL_TABLET | Freq: Once | ORAL | Status: AC
  Administered 2023-07-23: 650 mg via ORAL

## 2023-07-23 MED ORDER — ACETAMINOPHEN 325 MG PO TABS
ORAL_TABLET | ORAL | Status: AC
  Filled 2023-07-23: qty 2

## 2023-07-23 MED ORDER — ONDANSETRON 4 MG PO TBDP: 4.0000 mg | ORAL_TABLET | Freq: Three times a day (TID) | ORAL | 0 refills | Status: AC | PRN

## 2023-07-23 MED ORDER — OSELTAMIVIR PHOSPHATE 75 MG PO CAPS: 75.0000 mg | ORAL_CAPSULE | Freq: Two times a day (BID) | ORAL | 0 refills | Status: AC

## 2023-07-23 NOTE — ED Provider Notes (Signed)
[facility]    CSN: [id number] Arrival date & time: 07/23/23  1048      History   Chief Complaint Chief Complaint  Patient presents with   Cough    HPI Christine Wong is a 38 y.o. female.   Ms. Turk was recently positive for COVID approximately 2 weeks ago.  Her daughter then tested positive for the flu two days ago. Patient here today with c/o cough, chills, body aches, runny nose, headache, and nausea X 1 day. Last dose of Tylenol was last night.  She is currently on Xarelto for a DVT.  She will be on Xarelto for life.  Temperature in clinic is 101.9 F.  She was given Tylenol in clinic.  She denies difficulty breathing, chest tightness, wheezing.   Cough   Past Medical History:  Diagnosis Date   Anxiety    DVT (deep venous thrombosis) (HCC) 08/2019   HPV (human papilloma virus) infection    Seasonal allergies    Vaginal Pap smear, abnormal     Patient Active Problem List   Diagnosis Date Noted   Prediabetes 01/26/2023   Iron deficiency anemia due to chronic blood loss 12/01/2022   Gestational hypertension 05/23/2021   CVA (cerebral vascular accident) (HCC) 05/20/2021   Bell's palsy    Numbness    Slurred speech    [redacted] weeks gestation of pregnancy    Stroke-like symptoms 05/19/2021   Stroke-like symptom    History of deep vein thrombosis (DVT) of lower extremity    History of pulmonary embolism    DVT (deep venous thrombosis) (HCC) 05/07/2021   Pulmonary embolism (HCC) 05/07/2021   Factor V Leiden (HCC) 05/07/2021    Past Surgical History:  Procedure Laterality Date   CESAREAN SECTION N/A 05/22/2021   Procedure: CESAREAN SECTION;  Surgeon: Mitchel Honour, DO;  Location: [facility];  Service: Obstetrics;  Laterality: N/A;   WISDOM TOOTH EXTRACTION  2014    OB History     Gravida  3   Para  1   Term  1   Preterm      AB  2   Living  1      SAB  2   IAB      Ectopic      Multiple      Live Births  1            Home  Medications    Prior to Admission medications   Medication Sig Start Date End Date Taking? Authorizing Provider  ondansetron (ZOFRAN-ODT) 4 MG disintegrating tablet Take 1 tablet (4 mg total) by mouth every 8 (eight) hours as needed for nausea or vomiting. 07/23/23  Yes Elmer Picker, NP  oseltamivir (TAMIFLU) 75 MG capsule Take 1 capsule (75 mg total) by mouth every 12 (twelve) hours. 07/23/23  Yes Kahlyn Shippey, Ludger Nutting, NP  promethazine-dextromethorphan (PROMETHAZINE-DM) 6.25-15 MG/5ML syrup Take 5 mLs by mouth 4 (four) times daily. 07/12/23  Yes [provider]  rivaroxaban (XARELTO) 10 MG TABS tablet Take 10 mg by mouth daily.    [provider]    Family History Family History  Problem Relation Age of Onset   Hypertension Mother    Breast cancer Mother    Diabetes Father    Kidney failure Father    Heart disease Father    Hypertension Father    Liver disease Brother    Pancreatic cancer Maternal Grandmother    Hypertension Maternal Grandmother    Breast cancer Paternal Grandmother  Ovarian cancer Paternal Grandmother    Colon cancer Maternal Uncle    Esophageal cancer Neg Hx     Social History Social History   Tobacco Use   Smoking status: Former    Current packs/day: 0.00    Types: Cigarettes    Quit date: 2009    Years since quitting: 16.0   Smokeless tobacco: Never  Vaping Use   Vaping status: Never Used  Substance Use Topics   Alcohol use: Yes    Comment: socially   Drug use: Yes    Types: Marijuana    Comment: socially last use 9 months ago     Allergies   Feraheme [ferumoxytol], Peanut-containing drug products, and Penicillins   Review of Systems Review of Systems  Respiratory:  Positive for cough.      Physical Exam Triage Vital Signs ED Triage Vitals  Encounter Vitals Group     BP 07/23/23 1217 113/74     Systolic BP Percentile --      Diastolic BP Percentile --      Pulse Rate 07/23/23 1217 (!) 109     Resp 07/23/23 1217 16      Temp 07/23/23 1217 (!) 101.9 F (38.8 C)     Temp Source 07/23/23 1217 Oral     SpO2 07/23/23 1217 96 %     Weight 07/23/23 1218 230 lb (104.3 kg)     Height 07/23/23 1218 5' 2.5" (1.588 m)     Head Circumference --      Peak Flow --      Pain Score 07/23/23 1218 10     Pain Loc --      Pain Education --      Exclude from Growth Chart --    No data found.  Updated Vital Signs BP 113/74 (BP Location: Left Arm)   Pulse (!) 109   Temp (!) 101.9 F (38.8 C) (Oral)   Resp 16   Ht 5' 2.5" (1.588 m)   Wt 230 lb (104.3 kg)   LMP 07/14/2023 (Exact Date)   SpO2 96%   Breastfeeding No   BMI 41.40 kg/m   Visual Acuity Right Eye Distance:   Left Eye Distance:   Bilateral Distance:    Right Eye Near:   Left Eye Near:    Bilateral Near:     Physical Exam Vitals and nursing note reviewed.  Constitutional:      Appearance: She is well-developed. She is ill-appearing.  HENT:     Head: Normocephalic and atraumatic.     Nose: Rhinorrhea present.  Eyes:     Conjunctiva/sclera: Conjunctivae normal.  Cardiovascular:     Rate and Rhythm: Regular rhythm. Tachycardia present.     Heart sounds: No murmur heard. Pulmonary:     Effort: Pulmonary effort is normal. No respiratory distress.     Breath sounds: Normal breath sounds.  Abdominal:     Palpations: Abdomen is soft.     Tenderness: There is no abdominal tenderness.  Musculoskeletal:        General: No swelling.     Cervical back: Neck supple.  Skin:    General: Skin is warm and dry.     Capillary Refill: Capillary refill takes less than 2 seconds.  Neurological:     Mental Status: She is alert.  Psychiatric:        Mood and Affect: Mood normal.      UC Treatments / Results  Labs (all labs ordered are listed, but only  abnormal results are displayed) Labs Reviewed - No data to display  EKG   Radiology No results found.  Procedures Procedures (including critical care time)  Medications Ordered in  UC Medications  acetaminophen (TYLENOL) tablet 650 mg (650 mg Oral Given 07/23/23 1224)    Initial Impression / Assessment and Plan / UC Course  I have reviewed the triage vital signs and the nursing notes.  Pertinent labs & imaging results that were available during my care of the patient were reviewed by me and considered in my medical decision making (see chart for details). High clinical suspicion for influenza given high fever and quick onset of symptoms.  Will go ahead and treat with Tamiflu given clinical suspicion.  Defer COVID testing as she is recently tested positive for COVID. Otherwise recommend supportive care for symptomatic relief as outlined in AVS.  Patient nontoxic appearing with hemodynamically stable vital signs, therefore deferred imaging of the chest.  Modes of transmission, quarantine recommendations, and hand hygiene discussed.     Final Clinical Impressions(s) / UC Diagnoses   Final diagnoses:  Influenza with respiratory manifestation     Discharge Instructions      You have the flu. Take Tamiflu every 12 hours for the next 5 days to improve symptoms and stop the virus from replicating in your body. Tylenol as needed for fevers and body aches. Mucinex as needed for nasal congestion. Zofran as needed for nausea/vomiting.        ED Prescriptions     Medication Sig Dispense Auth. Provider   oseltamivir (TAMIFLU) 75 MG capsule Take 1 capsule (75 mg total) by mouth every 12 (twelve) hours. 10 capsule Elmer Picker, NP   ondansetron (ZOFRAN-ODT) 4 MG disintegrating tablet Take 1 tablet (4 mg total) by mouth every 8 (eight) hours as needed for nausea or vomiting. 20 tablet Elmer Picker, NP      PDMP not reviewed this encounter.   Elmer Picker, NP 07/23/23 1319

## 2023-07-23 NOTE — Discharge Instructions (Addendum)
You have the flu. Take Tamiflu every 12 hours for the next 5 days to improve symptoms and stop the virus from replicating in your body. Tylenol as needed for fevers and body aches. Mucinex as needed for nasal congestion. Zofran as needed for nausea/vomiting.

## 2023-07-23 NOTE — ED Triage Notes (Signed)
Patient here today with c/o cough, chills, body aches, runny nose, headache, and nausea X 1 day. Patient's 38 year old was diagnosed with the flu yesterday. Last dose of Tylenol was last night. Patient is on blood thinner.

## 2023-07-25 ENCOUNTER — Telehealth (INDEPENDENT_AMBULATORY_CARE_PROVIDER_SITE_OTHER): Payer: Self-pay | Admitting: Family

## 2023-07-25 ENCOUNTER — Ambulatory Visit: Payer: Self-pay | Admitting: Family

## 2023-07-25 DIAGNOSIS — J111 Influenza due to unidentified influenza virus with other respiratory manifestations: Secondary | ICD-10-CM | POA: Diagnosis not present

## 2023-07-25 NOTE — Progress Notes (Unsigned)
Virtual Visit via Video Note  I connected with Christine Wong, on 07/25/2023 at 3:59 PM by video and verified that I am speaking with the correct person using two identifiers.  Consent: I discussed the limitations, risks, security and privacy concerns of performing an evaluation and management service by video and the availability of in person appointments. I also discussed with the patient that there may be a patient responsible charge related to this service. The patient expressed understanding and agreed to proceed.   Location of Patient: Home  Location of Provider: [facility]   Persons participating in Telemedicine visit: [name], NP Curley Spice, CMA  History of Present Illness: Christine Wong is a 38 y.o. female who presents for Urgent Care follow-up.   Her issues/concerns for discussion today includes: - Recently tested positive for flu. Has improved some since then. Denies red flag symptoms. Taking prescribed medications. States she has a letter from Urgent Care to return to work on 07/27/2023 but needs more time.  - Reports noticed nodule of neck and unsure if related to her being sick or something else. Denies red flag symptoms. She plans to schedule an in-person office visit to follow-up.  - No further issues/concerns for discussion today.   Past Medical History:  Diagnosis Date   Anxiety    DVT (deep venous thrombosis) (HCC) 08/2019   HPV (human papilloma virus) infection    Seasonal allergies    Vaginal Pap smear, abnormal    Allergies  Allergen Reactions   Feraheme [Ferumoxytol] Shortness Of Breath    Infusion reaction 11/2022.  Shortness of breath / coughing.  Treated with albuterol, steroids, famotidine.   Peanut-Containing Drug Products Anaphylaxis   Penicillins Other (See Comments)    Unknown reaction    Current Outpatient Medications on File Prior to Visit  Medication Sig Dispense Refill   ondansetron  (ZOFRAN-ODT) 4 MG disintegrating tablet Take 1 tablet (4 mg total) by mouth every 8 (eight) hours as needed for nausea or vomiting. 20 tablet 0   oseltamivir (TAMIFLU) 75 MG capsule Take 1 capsule (75 mg total) by mouth every 12 (twelve) hours. 10 capsule 0   promethazine-dextromethorphan (PROMETHAZINE-DM) 6.25-15 MG/5ML syrup Take 5 mLs by mouth 4 (four) times daily.     rivaroxaban (XARELTO) 10 MG TABS tablet Take 10 mg by mouth daily.     No current facility-administered medications on file prior to visit.    Observations/Objective: Alert and oriented x 3. Not in acute distress. Physical examination not completed as this is a telemedicine visit.  Assessment and Plan: 1. Influenza with respiratory manifestation (Primary) - Continue present management.  - Patient provided with work letter.  - Follow-up with primary provider as scheduled.   Follow Up Instructions: Follow-up with primary provider as scheduled.    Patient was given clear instructions to go to Emergency Department or return to medical center if symptoms don't improve, worsen, or new problems develop.The patient verbalized understanding.  I discussed the assessment and treatment plan with the patient. The patient was provided an opportunity to ask questions and all were answered. The patient agreed with the plan and demonstrated an understanding of the instructions.   The patient was advised to call back or seek an in-person evaluation if the symptoms worsen or if the condition fails to improve as anticipated.     I provided 10 minutes total of non-face-to-face time during this encounter.   Ed Rayson Jodi Geralds, NP  [place]  [facility] [place], [place] [phone] 07/25/2023, 3:59 PM

## 2023-07-25 NOTE — Progress Notes (Signed)
THERAPIST PROGRESS NOTE Virtual Visit via Video Note  I connected with Christine Wong on 07/21/2023 at  3:00 PM EST by a video enabled telemedicine application and verified that I am speaking with the correct person using two identifiers.  Location: Patient: home Provider: office   I discussed the limitations of evaluation and management by telemedicine and the availability of in person appointments. The patient expressed understanding and agreed to proceed.   Follow Up Instructions: I discussed the assessment and treatment plan with the patient. The patient was provided an opportunity to ask questions and all were answered. The patient agreed with the plan and demonstrated an understanding of the instructions.   The patient was advised to call back or seek an in-person evaluation if the symptoms worsen or if the condition fails to improve as anticipated.   Session Time: 40 minutes  Participation Level: Active  Behavioral Response: CasualAlertAnxious  Type of Therapy: Individual Therapy  Treatment Goals addressed: Christine Wong will practice behavioral activation skills 3 times per week for the next 12 weeks   ProgressTowards Goals: Progressing  Interventions: CBT  Summary:  Christine Wong is a 38 y.o. female who presents for the scheduled appointment oriented x 5, appropriately dressed, and friendly.  Client denied hallucinations and delusions. Client reported on today she has been feeling frustrated.  Client reported today she is home because she had to pick her daughter up from daycare as she is sick again.  Client reported there are things that her job to let her do from home but they give her a hard time.  Client reported the dynamics with others at the office are not getting better and she feels that she is being targeted to leave the job.  Client reported she feels disregarded for as much hard work that she has put in to do the job the right way.  Client reported she has had  thoughts of up and moving to another city or another state to try and find work.  Client reported she has lived for jobs online but has not yet come across anything that fits her qualifications.  Client reported she is also enrolled in school for HR certification. 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  Suicidal/Homicidal: Nowithout intent/plan  Therapist Response:  Therapist began the appointment asking client how she has been doing since last seen. Therapist used CBT to engage with active listening and positive emotional support. Therapist used CBT to engage and give the client time to discuss her thoughts and feelings  Therapist used CBT to normalize the clients thoughts and emotions as well as engage with her about reframing her thoughts about work and her overall frustrations with her to be more productive to continue working past challenges towards her goal. Therapist used CBT ask the client to identify her progress with frequency of use with coping skills with continued practice in her daily activity.    Therapist assigned the client homework to practice shifting her perspective as worked on in session and practice self-care where she can.   Plan: Return again in 4 weeks.  Diagnosis: Major depressive disorder, recurrent episode, mild with anxious distress  Collaboration of Care: Patient refused AEB none requested by the client.  Patient/Guardian was advised Release of Information must be obtained prior to any record release in order to collaborate their care with an outside provider. Patient/Guardian was  advised if they have not already done so to contact the registration department to sign all necessary forms in order for [place] to release information regarding their care.   Consent: Patient/Guardian gives verbal consent for treatment and assignment of  benefits for services provided during this visit. Patient/Guardian expressed understanding and agreed to proceed.   Christine Rhymes Chauncey Bruno, LCSW 07/21/2023

## 2023-07-25 NOTE — Telephone Encounter (Signed)
Chief Complaint: Continued flu symptoms Symptoms: fever, cough, congestion Frequency: since Friday Pertinent Negatives: Patient denies difficult breathing Disposition: [] ED /[] Urgent Care (no appt availability in office) / [x] Appointment(In office/virtual)/ []  [facility]/ [] Home Care/ [] Refused Recommended Disposition /[] [facility]/ [x]  Follow-up with PCP Additional Notes: Patient called in stating she is still experiencing a fever of 101, headache, and cough/chills after flu diagnosis. Patient has been taking tamiflu. Patient is requesting work note extension for one day due to symptoms not resolving and also to due to being in close contact with pregnant coworker while still running a fever and having symptoms. Virtual appointment created for PCP evaluation.    Reason for Disposition  Influenza, questions about  Answer Assessment - Initial Assessment Questions 1. DIAGNOSIS CONFIRMATION: "When was the influenza diagnosed?" "By whom?" "Did you get a test for it?"     Saturday 02/01 2. MEDICINES: "Were you prescribed any medications for the influenza?"  (e.g., zanamivir [Relenza], oseltamavir [Tamiflu]).      Tamiflu and anti-nausea 3. ONSET of SYMPTOMS: "When did your symptoms start?"     Friday 4. SYMPTOMS: "What symptoms are you most concerned about?" (e.g., runny nose, stuffy nose, sore throat, cough, breathing difficulty, fever)     Feeling chills, feeling hot, runny nose, coughing, headache 5. COUGH: "How bad is the cough?"     Mild 6. FEVER: "Do you have a fever?" If Yes, ask: "What is your temperature, how was it measured, and when did it start?"     101 this morning at 2 am, but has lost thermometer due to toddler losing it 7. RESPIRATORY DISTRESS: "Are you having any trouble breathing?" If Yes, ask: "Describe your breathing."      Due to nasal congestion 8. FLU VACCINE: "Did you receive a flu shot this year?" (e.g., seasonal influenza, H1N1)     No 10.  HIGH RISK for COMPLICATIONS: "Do you have any heart or lung problems? Do you have a weakened immune system?" (e.g., CHF, COPD, asthma, HIV positive, chemotherapy, renal failure, diabetes mellitus, sickle cell anemia)       DVT hx  Protocols used: Influenza (Flu) Follow-up Call-A-[facility]

## 2023-07-25 NOTE — Telephone Encounter (Signed)
Virtual appointment today at 4pm

## 2023-07-30 ENCOUNTER — Other Ambulatory Visit: Payer: Self-pay | Admitting: Physician Assistant

## 2023-08-01 ENCOUNTER — Other Ambulatory Visit: Payer: Self-pay | Admitting: *Deleted

## 2023-08-01 MED ORDER — RIVAROXABAN 10 MG PO TABS: 10.0000 mg | ORAL_TABLET | Freq: Every day | ORAL | 5 refills | Status: DC

## 2023-08-25 ENCOUNTER — Encounter (INDEPENDENT_AMBULATORY_CARE_PROVIDER_SITE_OTHER): Payer: Self-pay

## 2023-08-30 ENCOUNTER — Ambulatory Visit: Admitting: Family

## 2023-08-31 ENCOUNTER — Ambulatory Visit (INDEPENDENT_AMBULATORY_CARE_PROVIDER_SITE_OTHER): Admitting: Family

## 2023-08-31 VITALS — BP 122/84 | HR 76 | Temp 98.5°F | Ht 62.5 in | Wt 231.6 lb

## 2023-08-31 DIAGNOSIS — G43909 Migraine, unspecified, not intractable, without status migrainosus: Secondary | ICD-10-CM

## 2023-08-31 DIAGNOSIS — Z13220 Encounter for screening for lipoid disorders: Secondary | ICD-10-CM

## 2023-08-31 DIAGNOSIS — J3089 Other allergic rhinitis: Secondary | ICD-10-CM | POA: Diagnosis not present

## 2023-08-31 DIAGNOSIS — R221 Localized swelling, mass and lump, neck: Secondary | ICD-10-CM

## 2023-08-31 MED ORDER — SUMATRIPTAN SUCCINATE 25 MG PO TABS: ORAL_TABLET | ORAL | 1 refills | Status: AC

## 2023-08-31 NOTE — Progress Notes (Signed)
 Patient states headaches.

## 2023-08-31 NOTE — Progress Notes (Signed)
 Patient ID: Christine Wong, female    DOB: Jan 25, 1986  MRN: [id number]  CC: Follow-Up  Subjective: Christine Wong is a 38 y.o. female who presents for follow-up.   Her concerns today include:  - Right neck nodule present for several months. Denies red flag symptoms.  - Intermittent headaches. Reports mostly located at back of head and right side of head. Denies red flag symptoms. Taking over-the-counter medications with minimal relief. - Allergies. Taking over-the-counter medication with minimal relief. Would like referral to specialist. - Patient requests referrals be sent to [facility], [place] due to she will be moving soon. - Cholesterol lab.    Patient Active Problem List   Diagnosis Date Noted   Prediabetes 01/26/2023   Iron deficiency anemia due to chronic blood loss 12/01/2022   Gestational hypertension 05/23/2021   CVA (cerebral vascular accident) (HCC) 05/20/2021   Bell's palsy    Numbness    Slurred speech    [redacted] weeks gestation of pregnancy    Stroke-like symptoms 05/19/2021   Stroke-like symptom    History of deep vein thrombosis (DVT) of lower extremity    History of pulmonary embolism    DVT (deep venous thrombosis) (HCC) 05/07/2021   Pulmonary embolism (HCC) 05/07/2021   Factor V Leiden (HCC) 05/07/2021     Current Outpatient Medications on File Prior to Visit  Medication Sig Dispense Refill   rivaroxaban (XARELTO) 10 MG TABS tablet Take 1 tablet (10 mg total) by mouth daily. 30 tablet 5   ondansetron (ZOFRAN-ODT) 4 MG disintegrating tablet Take 1 tablet (4 mg total) by mouth every 8 (eight) hours as needed for nausea or vomiting. 20 tablet 0   oseltamivir (TAMIFLU) 75 MG capsule Take 1 capsule (75 mg total) by mouth every 12 (twelve) hours. 10 capsule 0   promethazine-dextromethorphan (PROMETHAZINE-DM) 6.25-15 MG/5ML syrup Take 5 mLs by mouth 4 (four) times daily.     No current facility-administered medications on file prior to visit.    Allergies  Allergen  Reactions   Feraheme [Ferumoxytol] Shortness Of Breath    Infusion reaction 11/2022.  Shortness of breath / coughing.  Treated with albuterol, steroids, famotidine.   Peanut-Containing Drug Products Anaphylaxis   Penicillins Other (See Comments)    Unknown reaction    Social History   Socioeconomic History   Marital status: Single    Spouse name: Not on file   Number of children: 1   Years of education: Not on file   Highest education level: Some college, no degree  Occupational History   Occupation: accounting  Tobacco Use   Smoking status: Former    Current packs/day: 0.00    Types: Cigarettes    Quit date: 2009    Years since quitting: 16.2   Smokeless tobacco: Never  Vaping Use   Vaping status: Never Used  Substance and Sexual Activity   Alcohol use: Yes    Comment: socially   Drug use: Yes    Types: Marijuana    Comment: socially last use 9 months ago   Sexual activity: Yes    Partners: Female    Birth control/protection: None  Other Topics Concern   Not on file  Social History Narrative   Not on file   Social Drivers of Health   Financial Resource Strain: Low Risk  (08/31/2023)   Overall Financial Resource Strain (CARDIA)    Difficulty of Paying Living Expenses: Not hard at all  Food Insecurity: No Food Insecurity (08/31/2023)   Hunger Vital Sign  Worried About Programme researcher, broadcasting/film/video in the Last Year: Never true    Ran Out of Food in the Last Year: Never true  Transportation Needs: No Transportation Needs (08/31/2023)   PRAPARE - Administrator, Civil Service (Medical): No    Lack of Transportation (Non-Medical): No  Physical Activity: Insufficiently Active (08/31/2023)   Exercise Vital Sign    Days of Exercise per Week: 6 days    Minutes of Exercise per Session: 20 min  Stress: Stress Concern Present (08/31/2023)   [employer] of Occupational Health - Occupational Stress Questionnaire    Feeling of Stress : To some extent  Social  Connections: Socially Isolated (08/31/2023)   Social Connection and Isolation Panel [NHANES]    Frequency of Communication with Friends and Family: More than three times a week    Frequency of Social Gatherings with Friends and Family: Once a week    Attends Religious Services: Never    Database administrator or Organizations: No    Attends Engineer, structural: Not on file    Marital Status: Never married  Catering manager Violence: Not on file    Family History  Problem Relation Age of Onset   Hypertension Mother    Breast cancer Mother    Diabetes Father    Kidney failure Father    Heart disease Father    Hypertension Father    Liver disease Brother    Pancreatic cancer Maternal Grandmother    Hypertension Maternal Grandmother    Breast cancer Paternal Grandmother    Ovarian cancer Paternal Grandmother    Colon cancer Maternal Uncle    Esophageal cancer Neg Hx     Past Surgical History:  Procedure Laterality Date   CESAREAN SECTION N/A 05/22/2021   Procedure: CESAREAN SECTION;  Surgeon: Mitchel Honour, DO;  Location: [facility]S;  Service: Obstetrics;  Laterality: N/A;   WISDOM TOOTH EXTRACTION  2014    ROS: Review of Systems Negative except as stated above  PHYSICAL EXAM: BP 122/84   Pulse 76   Temp 98.5 F (36.9 C) (Oral)   Ht 5' 2.5" (1.588 m)   Wt 231 lb 9.6 oz (105.1 kg)   LMP 08/10/2023   SpO2 93%   BMI 41.69 kg/m   Physical Exam HENT:     Head: Normocephalic and atraumatic.     Nose: Nose normal.     Mouth/Throat:     Mouth: Mucous membranes are moist.     Pharynx: Oropharynx is clear.  Eyes:     Extraocular Movements: Extraocular movements intact.     Conjunctiva/sclera: Conjunctivae normal.     Pupils: Pupils are equal, round, and reactive to light.  Neck:     Comments: Firm movable non-tender nodule of right neck.  Cardiovascular:     Rate and Rhythm: Normal rate and regular rhythm.     Pulses: Normal pulses.     Heart sounds: Normal  heart sounds.  Pulmonary:     Effort: Pulmonary effort is normal.     Breath sounds: Normal breath sounds.  Musculoskeletal:        General: Normal range of motion.     Cervical back: Normal range of motion and neck supple.  Neurological:     General: No focal deficit present.     Mental Status: She is alert and oriented to person, place, and time.  Psychiatric:        Mood and Affect: Mood normal.  Behavior: Behavior normal.     ASSESSMENT AND PLAN: 1. Migraine without status migrainosus, not intractable, unspecified migraine type (Primary) - Sumatriptan as prescribed. Counseled on medication adherence/adverse effects.  - Referral to Neurology for evaluation/management.  - Follow-up with primary provider as scheduled. - SUMAtriptan (IMITREX) 25 MG tablet; Take 25 mg (1 tablet total) by mouth at the start of the headache. May repeat in 2 hours x 1 if headache persists. Max of 2 tablets/24 hours.  Dispense: 30 tablet; Refill: 1 - Ambulatory referral to Neurology  2. Nodule of neck - Ultrasound thyroid for evaluation.  - Routine screening.  - TSH - US THYROID; Future  3. Perennial allergic rhinitis - Referral to Allergy for evaluation/management. - Ambulatory referral to Allergy  4. Screening cholesterol level - Routine screening.  - Lipid panel   Patient was given the opportunity to ask questions.  Patient verbalized understanding of the plan and was able to repeat key elements of the plan. Patient was given clear instructions to go to Emergency Department or return to medical center if symptoms don't improve, worsen, or new problems develop.The patient verbalized understanding.   Orders Placed This Encounter  Procedures   US THYROID   TSH   Lipid panel   Ambulatory referral to Neurology   Ambulatory referral to Allergy     Requested Prescriptions   Signed Prescriptions Disp Refills   SUMAtriptan (IMITREX) 25 MG tablet 30 tablet 1    Sig: Take 25 mg (1  tablet total) by mouth at the start of the headache. May repeat in 2 hours x 1 if headache persists. Max of 2 tablets/24 hours.    Follow-up with primary provider as scheduled.  Rema [name], NP

## 2023-09-01 ENCOUNTER — Encounter: Payer: Self-pay | Admitting: Family

## 2023-09-01 LAB — TSH: TSH: 0.718 u[IU]/mL (ref 0.450–4.500)

## 2023-09-01 LAB — LIPID PANEL
Chol/HDL Ratio: 3.7 ratio (ref 0.0–4.4)
Cholesterol, Total: 190 mg/dL (ref 100–199)
HDL: 52 mg/dL (ref >39)
LDL Chol Calc (NIH): 124 mg/dL — ABNORMAL HIGH (ref 0–99)
Triglycerides: 78 mg/dL (ref 0–149)
VLDL Cholesterol Cal: 14 mg/dL (ref 5–40)

## 2023-09-02 ENCOUNTER — Ambulatory Visit
Admission: RE | Admit: 2023-09-02 | Discharge: 2023-09-02 | Disposition: A | Discharge status: Home / Self Care | Source: Ambulatory Visit | Attending: Family | Admitting: Family

## 2023-09-02 DIAGNOSIS — R221 Localized swelling, mass and lump, neck: Secondary | ICD-10-CM

## 2023-09-05 ENCOUNTER — Other Ambulatory Visit: Payer: Self-pay | Admitting: Family

## 2023-09-05 ENCOUNTER — Ambulatory Visit
Admission: EM | Admit: 2023-09-05 | Discharge: 2023-09-05 | Disposition: A | Discharge status: Home / Self Care | Attending: Family Medicine | Admitting: Family Medicine

## 2023-09-05 ENCOUNTER — Encounter: Payer: Self-pay | Admitting: Family

## 2023-09-05 DIAGNOSIS — R21 Rash and other nonspecific skin eruption: Secondary | ICD-10-CM

## 2023-09-05 DIAGNOSIS — R221 Localized swelling, mass and lump, neck: Secondary | ICD-10-CM

## 2023-09-05 MED ORDER — VALACYCLOVIR HCL 1 G PO TABS: 1000.0000 mg | ORAL_TABLET | Freq: Three times a day (TID) | ORAL | 0 refills | Status: AC

## 2023-09-05 NOTE — ED Provider Notes (Signed)
 [facility]    CSN: [id number] Arrival date & time: 09/05/23  1138      History   Chief Complaint No chief complaint on file.   HPI Christine Wong is a 38 y.o. female.   Patient is here primarily for a painful rash.  Rash is located under the left breast, started 4 days ago with itching, thought was eczema.  Now has open wounds, and is painful.  She has used cortisone cream and then used neosporin.  Then vasoline.        Past Medical History:  Diagnosis Date   Anxiety    DVT (deep venous thrombosis) (HCC) 08/2019   HPV (human papilloma virus) infection    Seasonal allergies    Vaginal Pap smear, abnormal     Patient Active Problem List   Diagnosis Date Noted   Prediabetes 01/26/2023   Iron deficiency anemia due to chronic blood loss 12/01/2022   Gestational hypertension 05/23/2021   CVA (cerebral vascular accident) (HCC) 05/20/2021   Bell's palsy    Numbness    Slurred speech    [redacted] weeks gestation of pregnancy    Stroke-like symptoms 05/19/2021   Stroke-like symptom    History of deep vein thrombosis (DVT) of lower extremity    History of pulmonary embolism    DVT (deep venous thrombosis) (HCC) 05/07/2021   Pulmonary embolism (HCC) 05/07/2021   Factor V Leiden (HCC) 05/07/2021    Past Surgical History:  Procedure Laterality Date   CESAREAN SECTION N/A 05/22/2021   Procedure: CESAREAN SECTION;  Surgeon: Mitchel Honour, DO;  Location: [facility];  Service: Obstetrics;  Laterality: N/A;   WISDOM TOOTH EXTRACTION  2014    OB History     Gravida  3   Para  1   Term  1   Preterm      AB  2   Living  1      SAB  2   IAB      Ectopic      Multiple      Live Births  1            Home Medications    Prior to Admission medications   Medication Sig Start Date End Date Taking? Authorizing Provider  ondansetron (ZOFRAN-ODT) 4 MG disintegrating tablet Take 1 tablet (4 mg total) by mouth every 8 (eight) hours as needed for nausea  or vomiting. 07/23/23   Elmer Picker, NP  oseltamivir (TAMIFLU) 75 MG capsule Take 1 capsule (75 mg total) by mouth every 12 (twelve) hours. 07/23/23   Elmer Picker, NP  promethazine-dextromethorphan (PROMETHAZINE-DM) 6.25-15 MG/5ML syrup Take 5 mLs by mouth 4 (four) times daily. 07/12/23   [provider]  rivaroxaban (XARELTO) 10 MG TABS tablet Take 1 tablet (10 mg total) by mouth daily. 08/01/23   Jaci Standard, MD  SUMAtriptan (IMITREX) 25 MG tablet Take 25 mg (1 tablet total) by mouth at the start of the headache. May repeat in 2 hours x 1 if headache persists. Max of 2 tablets/24 hours. 08/31/23   Rema [name], NP    Family History Family History  Problem Relation Age of Onset   Hypertension Mother    Breast cancer Mother    Diabetes Father    Kidney failure Father    Heart disease Father    Hypertension Father    Liver disease Brother    Pancreatic cancer Maternal Grandmother    Hypertension Maternal Grandmother  Breast cancer Paternal Grandmother    Ovarian cancer Paternal Grandmother    Colon cancer Maternal Uncle    Esophageal cancer Neg Hx     Social History Social History   Tobacco Use   Smoking status: Former    Current packs/day: 0.00    Types: Cigarettes    Quit date: 2009    Years since quitting: 16.2   Smokeless tobacco: Never  Vaping Use   Vaping status: Never Used  Substance Use Topics   Alcohol use: Yes    Comment: socially   Drug use: Yes    Types: Marijuana    Comment: socially last use 9 months ago     Allergies   Feraheme [ferumoxytol], Peanut-containing drug products, and Penicillins   Review of Systems Review of Systems  Constitutional:  Positive for fatigue.  HENT: Negative.    Respiratory: Negative.    Cardiovascular: Negative.   Gastrointestinal: Negative.   Genitourinary: Negative.   Skin:  Positive for rash.  Psychiatric/Behavioral: Negative.       Physical Exam Triage Vital Signs ED Triage Vitals [09/05/23  1146]  Encounter Vitals Group     BP 129/85     Systolic BP Percentile      Diastolic BP Percentile      Pulse Rate 80     Resp 16     Temp 98.6 F (37 C)     Temp Source Oral     SpO2 95 %     Weight      Height      Head Circumference      Peak Flow      Pain Score 9     Pain Loc      Pain Education      Exclude from Growth Chart    No data found.  Updated Vital Signs BP 129/85 (BP Location: Left Arm)   Pulse 80   Temp 98.6 F (37 C) (Oral)   Resp 16   LMP 08/10/2023 (Exact Date)   SpO2 95%   Visual Acuity Right Eye Distance:   Left Eye Distance:   Bilateral Distance:    Right Eye Near:   Left Eye Near:    Bilateral Near:     Physical Exam Constitutional:      Appearance: Normal appearance. She is normal weight.  Skin:    Comments: Under the left breast is a cluster of open sores/lesions;  the area is very tender;  no other rashes noted to the left flank/back No abscess no fluid collection today;   Neurological:     General: No focal deficit present.     Mental Status: She is alert.  Psychiatric:        Mood and Affect: Mood normal.      UC Treatments / Results  Labs (all labs ordered are listed, but only abnormal results are displayed) Labs Reviewed - No data to display  EKG   Radiology No results found.  Procedures Procedures (including critical care time)  Medications Ordered in UC Medications - No data to display  Initial Impression / Assessment and Plan / UC Course  I have reviewed the triage vital signs and the nursing notes.  Pertinent labs & imaging results that were available during my care of the patient were reviewed by me and considered in my medical decision making (see chart for details).  Final Clinical Impressions(s) / UC Diagnoses   Final diagnoses:  Rash and nonspecific skin eruption  Discharge Instructions      You were seen today for a rash.  I am treating you for shingles today.  I have sent out an oral  anti-viral to treat this.  I recommend you take tylenol/motrin for pain.  Please keep the area covered until the spots crust over.  Please return if not improving or worsening.      ED Prescriptions     Medication Sig Dispense Auth. Provider   valACYclovir (VALTREX) 1000 MG tablet Take 1 tablet (1,000 mg total) by mouth 3 (three) times daily for 7 days. 21 tablet Jannifer Franklin, MD      PDMP not reviewed this encounter.   Jannifer Franklin, MD 09/05/23 [phone]

## 2023-09-05 NOTE — ED Triage Notes (Signed)
 Pt presents with c/o generalized body aches and rash under her breast. Pt reports she thinks she has shingles. States the rash hurts.   Pt states anything that touches the area causes pain.

## 2023-09-05 NOTE — Discharge Instructions (Signed)
 You were seen today for a rash.  I am treating you for shingles today.  I have sent out an oral anti-viral to treat this.  I recommend you take tylenol/motrin for pain.  Please keep the area covered until the spots crust over.  Please return if not improving or worsening.

## 2023-09-06 ENCOUNTER — Ambulatory Visit: Payer: Medicaid Other | Admitting: Family

## 2023-11-25 ENCOUNTER — Ambulatory Visit: Admitting: Family

## 2023-12-29 ENCOUNTER — Other Ambulatory Visit: Payer: Self-pay

## 2023-12-29 DIAGNOSIS — D500 Iron deficiency anemia secondary to blood loss (chronic): Secondary | ICD-10-CM

## 2023-12-30 ENCOUNTER — Inpatient Hospital Stay: Payer: Medicaid Other | Attending: Hematology and Oncology

## 2024-01-06 ENCOUNTER — Inpatient Hospital Stay: Payer: Medicaid Other | Admitting: Physician Assistant

## 2024-02-02 ENCOUNTER — Other Ambulatory Visit: Payer: Self-pay | Admitting: Hematology and Oncology

## 2024-04-17 ENCOUNTER — Telehealth: Payer: Self-pay

## 2024-04-17 ENCOUNTER — Other Ambulatory Visit: Payer: Self-pay

## 2024-04-17 DIAGNOSIS — D500 Iron deficiency anemia secondary to blood loss (chronic): Secondary | ICD-10-CM

## 2024-04-17 DIAGNOSIS — I8290 Acute embolism and thrombosis of unspecified vein: Secondary | ICD-10-CM

## 2024-04-17 NOTE — Telephone Encounter (Signed)
 Spoke with pt regarding a lab appt for tomorrow and she said she has moved to [place].   Appts cancelled and referral faxed to [facility] for Transfer of Care  Confirmation received

## 2024-04-18 ENCOUNTER — Inpatient Hospital Stay: Admitting: Physician Assistant

## 2024-04-18 ENCOUNTER — Inpatient Hospital Stay

## 2024-07-27 ENCOUNTER — Encounter: Payer: Self-pay | Admitting: Family

## 2024-07-27 ENCOUNTER — Ambulatory Visit: Admitting: Family

## 2024-07-27 VITALS — BP 121/76 | HR 88 | Ht 62.5 in | Wt 239.8 lb

## 2024-07-27 DIAGNOSIS — Z1329 Encounter for screening for other suspected endocrine disorder: Secondary | ICD-10-CM

## 2024-07-27 DIAGNOSIS — Z0000 Encounter for general adult medical examination without abnormal findings: Secondary | ICD-10-CM

## 2024-07-27 DIAGNOSIS — Z13228 Encounter for screening for other metabolic disorders: Secondary | ICD-10-CM

## 2024-07-27 DIAGNOSIS — R221 Localized swelling, mass and lump, neck: Secondary | ICD-10-CM

## 2024-07-27 DIAGNOSIS — Z131 Encounter for screening for diabetes mellitus: Secondary | ICD-10-CM

## 2024-07-27 DIAGNOSIS — Z130 Encounter for screening for diseases of the blood and blood-forming organs and certain disorders involving the immune mechanism: Secondary | ICD-10-CM

## 2024-07-27 DIAGNOSIS — Z1231 Encounter for screening mammogram for malignant neoplasm of breast: Secondary | ICD-10-CM

## 2024-07-27 DIAGNOSIS — Z13220 Encounter for screening for lipoid disorders: Secondary | ICD-10-CM

## 2024-07-27 NOTE — Progress Notes (Signed)
 "   Patient ID: Christine Wong, female    DOB: [birth year]  MRN: [id number]  CC: Annual Exam  Subjective: Christine Wong is a 39 y.o. female who presents for annual exam.  Her concerns today include:  - Due for mammogram.  - Up to date on cervical cancer screening per [facility].  - Left neck nodule persisting. Denies red flag symptoms.   Patient Active Problem List   Diagnosis Date Noted   Prediabetes 01/26/2023   Iron  deficiency anemia due to chronic blood loss 12/01/2022   Gestational hypertension 05/23/2021   CVA (cerebral vascular accident) (HCC) 05/20/2021   Bell's palsy    Numbness    Slurred speech    [redacted] weeks gestation of pregnancy    Stroke-like symptoms 05/19/2021   Stroke-like symptom    History of deep vein thrombosis (DVT) of lower extremity    History of pulmonary embolism    DVT (deep venous thrombosis) (HCC) 05/07/2021   Pulmonary embolism (HCC) 05/07/2021   Factor V Leiden 05/07/2021     Medications Ordered Prior to Encounter[1]  Allergies[2]  Social History   Socioeconomic History   Marital status: Single    Spouse name: Not on file   Number of children: 1   Years of education: Not on file   Highest education level: Some college, no degree  Occupational History   Occupation: audiological scientist  Tobacco Use   Smoking status: Former    Current packs/day: 0.00    Types: Cigarettes    Quit date: 2009    Years since quitting: 17.1    Passive exposure: Past   Smokeless tobacco: Never  Vaping Use   Vaping status: Never Used  Substance and Sexual Activity   Alcohol use: Yes    Comment: socially   Drug use: Not Currently    Types: Marijuana    Comment: socially last use 9 months ago   Sexual activity: Not Currently    Partners: Female    Birth control/protection: None  Other Topics Concern   Not on file  Social History Narrative   Not on file   Social Drivers of Health   Tobacco Use: Medium Risk (07/27/2024)   Patient History    Smoking Tobacco  Use: Former    Smokeless Tobacco Use: Never    Passive Exposure: Past  Physicist, Medical Strain: Low Risk (08/31/2023)   Overall Financial Resource Strain (CARDIA)    Difficulty of Paying Living Expenses: Not hard at all  Food Insecurity: Medium Risk (10/28/2023)   Received from [facility]   Epic    Within the past 12 months, you worried that your food would run out before you got money to buy more: Sometimes true    Within the past 12 months, the food you bought just didn't last and you didn't have money to get more. : Sometimes true  Transportation Needs: No Transportation Needs (10/28/2023)   Received from [facility]    In the past 12 months, has lack of reliable transportation kept you from medical appointments, meetings, work or from getting things needed for daily living? : No  Physical Activity: Insufficiently Active (08/31/2023)   Exercise Vital Sign    Days of Exercise per Week: 6 days    Minutes of Exercise per Session: 20 min  Stress: Stress Concern Present (08/31/2023)   [employer] of Occupational Health - Occupational Stress Questionnaire    Feeling of Stress : To some extent  Social Connections: Socially Isolated (08/31/2023)  Social Advertising Account Executive    Frequency of Communication with Friends and Family: More than three times a week    Frequency of Social Gatherings with Friends and Family: Once a week    Attends Religious Services: Never    Database Administrator or Organizations: No    Attends Engineer, Structural: Not on file    Marital Status: Never married  Intimate Partner Violence: Not on file  Depression (PHQ2-9): Low Risk (08/31/2023)   Depression (PHQ2-9)    PHQ-2 Score: 2  Alcohol Screen: Low Risk (08/31/2023)   Alcohol Screen    Last Alcohol Screening Score (AUDIT): 4  Housing: Low Risk (10/28/2023)   Received from [facility]   Epic    What is your living situation today?: I have a steady place to live     Think about the place you live. Do you have problems with any of the following? Choose all that apply:: Pt declined to answer  Utilities: Low Risk (10/28/2023)   Received from [facility]   Utilities    In the past 12 months has the electric, gas, oil, or water company threatened to shut off services in your home? : No  Health Literacy: Not on file    Family History  Problem Relation Age of Onset   Hypertension Mother    Breast cancer Mother    Diabetes Father    Kidney failure Father    Heart disease Father    Hypertension Father    Liver disease Brother    Colon cancer Maternal Uncle    Pancreatic cancer Maternal Grandmother    Hypertension Maternal Grandmother    Kidney failure Paternal Grandmother    Breast cancer Paternal Grandmother    Ovarian cancer Paternal Grandmother    Kidney failure Paternal Grandfather    Esophageal cancer Neg Hx     Past Surgical History:  Procedure Laterality Date   CESAREAN SECTION N/A 05/22/2021   Procedure: CESAREAN SECTION;  Surgeon: Dannielle Bouchard, DO;  Location: [facility] LD ORS;  Service: Obstetrics;  Laterality: N/A;   WISDOM TOOTH EXTRACTION  2014    ROS: Review of Systems Negative except as stated above  PHYSICAL EXAM: BP 121/76   Pulse 88   Ht 5' 2.5 (1.588 m)   Wt 239 lb 12.8 oz (108.8 kg)   LMP 07/26/2024 (Exact Date)   SpO2 95%   BMI 43.16 kg/m   Physical Exam HENT:     Head: Normocephalic and atraumatic.     Right Ear: Tympanic membrane, ear canal and external ear normal.     Left Ear: Tympanic membrane, ear canal and external ear normal.     Nose: Nose normal.     Mouth/Throat:     Mouth: Mucous membranes are moist.     Pharynx: Oropharynx is clear.  Eyes:     Extraocular Movements: Extraocular movements intact.     Conjunctiva/sclera: Conjunctivae normal.     Pupils: Pupils are equal, round, and reactive to light.  Neck:     Thyroid : No thyroid  mass, thyromegaly or thyroid  tenderness.     Comments: Nodule left  neck.  Cardiovascular:     Rate and Rhythm: Normal rate and regular rhythm.     Pulses: Normal pulses.     Heart sounds: Normal heart sounds.  Pulmonary:     Effort: Pulmonary effort is normal.     Breath sounds: Normal breath sounds.  Chest:     Comments: Patient declined.  Abdominal:     General: Bowel sounds are normal.     Palpations: Abdomen is soft.  Genitourinary:    Comments: Patient declined.  Musculoskeletal:        General: Normal range of motion.     Right shoulder: Normal.     Left shoulder: Normal.     Right upper arm: Normal.     Left upper arm: Normal.     Right elbow: Normal.     Left elbow: Normal.     Right forearm: Normal.     Left forearm: Normal.     Right wrist: Normal.     Left wrist: Normal.     Right hand: Normal.     Left hand: Normal.     Cervical back: Normal, normal range of motion and neck supple.     Thoracic back: Normal.     Lumbar back: Normal.     Right hip: Normal.     Left hip: Normal.     Right upper leg: Normal.     Left upper leg: Normal.     Right knee: Normal.     Left knee: Normal.     Right lower leg: Normal.     Left lower leg: Normal.     Right ankle: Normal.     Left ankle: Normal.     Right foot: Normal.     Left foot: Normal.  Skin:    General: Skin is warm and dry.     Capillary Refill: Capillary refill takes less than 2 seconds.  Neurological:     General: No focal deficit present.     Mental Status: She is alert and oriented to person, place, and time.  Psychiatric:        Mood and Affect: Mood normal.        Behavior: Behavior normal.     ASSESSMENT AND PLAN: 1. Annual physical exam (Primary) - Counseled on 150 minutes of exercise per week as tolerated, healthy eating (including decreased daily intake of saturated fats, cholesterol, added sugars, sodium), STI prevention, and routine healthcare maintenance.  2. Screening for metabolic disorder - Routine screening.  - CMP14+EGFR  3. Screening for  deficiency anemia - Routine screening.  - CBC  4. Diabetes mellitus screening - Routine screening.  - Hemoglobin A1c  5. Screening cholesterol level - Routine screening.  - Lipid panel  6. Thyroid  disorder screen - Routine screening.  - TSH  7. Encounter for screening mammogram for malignant neoplasm of breast - Routine screening.  - MM Digital Screening; Future  8. Nodule of neck - US  Thyroid  for evaluation.  - Referral to ENT for evaluation/management.  - US  THYROID ; Future - Ambulatory referral to ENT  Patient was given the opportunity to ask questions.  Patient verbalized understanding of the plan and was able to repeat key elements of the plan. Patient was given clear instructions to go to Emergency Department or return to medical center if symptoms don't improve, worsen, or new problems develop.The patient verbalized understanding.   Orders Placed This Encounter  Procedures   MM Digital Screening   US  THYROID    CBC   Lipid panel   CMP14+EGFR   Hemoglobin A1c   TSH   Ambulatory referral to ENT    Return in about 1 year (around 07/27/2025) for Physical per patient preference.  Calyn Rubi JINNY Chute, NP      [1]  Current Outpatient Medications on File Prior to Visit  Medication Sig Dispense Refill  cetirizine (ZYRTEC) 10 MG tablet Take 10 mg by mouth.     omeprazole (PRILOSEC) 20 MG capsule Take 20 mg by mouth.     rivaroxaban  (XARELTO ) 10 MG TABS tablet Take 1 tablet by mouth daily.     rizatriptan (MAXALT-MLT) 10 MG disintegrating tablet 10 mg.     XARELTO  10 MG TABS tablet TAKE 1 TABLET BY MOUTH EVERY DAY 30 tablet 5   ondansetron  (ZOFRAN -ODT) 4 MG disintegrating tablet Take 1 tablet (4 mg total) by mouth every 8 (eight) hours as needed for nausea or vomiting. 20 tablet 0   oseltamivir  (TAMIFLU ) 75 MG capsule Take 1 capsule (75 mg total) by mouth every 12 (twelve) hours. 10 capsule 0   promethazine-dextromethorphan (PROMETHAZINE-DM) 6.25-15 MG/5ML syrup Take 5 mLs  by mouth 4 (four) times daily.     SUMAtriptan  (IMITREX ) 25 MG tablet Take 25 mg (1 tablet total) by mouth at the start of the headache. May repeat in 2 hours x 1 if headache persists. Max of 2 tablets/24 hours. 30 tablet 1   No current facility-administered medications on file prior to visit.  [2]  Allergies Allergen Reactions   Feraheme [Ferumoxytol ] Shortness Of Breath    Infusion reaction 11/2022.  Shortness of breath / coughing.  Treated with albuterol , steroids, famotidine .   Peanut-Containing Drug Products Anaphylaxis   Penicillins Other (See Comments)    Unknown reaction   "

## 2024-08-06 ENCOUNTER — Other Ambulatory Visit

## 2025-01-28 ENCOUNTER — Encounter: Payer: Self-pay | Admitting: Family

## 2025-07-29 ENCOUNTER — Encounter: Payer: Self-pay | Admitting: Family
